# Patient Record
Sex: Female | Born: 1965 | Race: Black or African American | Hispanic: No | Marital: Single | Smoking: Never smoker
Health system: Southern US, Community
[De-identification: ages and names within clinical notes are randomized; demographics above are authoritative.]

## PROBLEM LIST (undated history)

## (undated) DIAGNOSIS — I10 Essential (primary) hypertension: Secondary | ICD-10-CM

## (undated) DIAGNOSIS — I509 Heart failure, unspecified: Secondary | ICD-10-CM

## (undated) DIAGNOSIS — E119 Type 2 diabetes mellitus without complications: Secondary | ICD-10-CM

## (undated) HISTORY — DX: Heart failure, unspecified: I50.9

---

## 2005-04-14 ENCOUNTER — Emergency Department: Payer: Self-pay | Admitting: Emergency Medicine

## 2006-02-17 ENCOUNTER — Ambulatory Visit: Payer: Self-pay | Admitting: Family Medicine

## 2006-03-02 ENCOUNTER — Ambulatory Visit: Payer: Self-pay | Admitting: Family Medicine

## 2006-04-01 ENCOUNTER — Ambulatory Visit: Payer: Self-pay | Admitting: Family Medicine

## 2006-05-02 ENCOUNTER — Ambulatory Visit: Payer: Self-pay | Admitting: Family Medicine

## 2006-07-01 ENCOUNTER — Emergency Department: Payer: Self-pay | Admitting: Emergency Medicine

## 2008-01-04 ENCOUNTER — Emergency Department: Payer: Self-pay

## 2009-03-27 ENCOUNTER — Emergency Department: Payer: Self-pay | Admitting: Emergency Medicine

## 2009-06-09 ENCOUNTER — Emergency Department: Payer: Self-pay | Admitting: Emergency Medicine

## 2010-05-22 ENCOUNTER — Inpatient Hospital Stay: Payer: Self-pay | Admitting: Internal Medicine

## 2010-05-23 ENCOUNTER — Ambulatory Visit: Payer: Self-pay | Admitting: Cardiovascular Disease

## 2010-09-02 ENCOUNTER — Emergency Department: Payer: Self-pay | Admitting: Emergency Medicine

## 2010-09-06 ENCOUNTER — Emergency Department (HOSPITAL_COMMUNITY): Admission: EM | Admit: 2010-09-06 | Discharge: 2010-09-06 | Payer: Self-pay | Admitting: Emergency Medicine

## 2010-12-01 ENCOUNTER — Emergency Department: Payer: Self-pay | Admitting: Emergency Medicine

## 2011-02-14 LAB — DIFFERENTIAL
Basophils Absolute: 0 10*3/uL (ref 0.0–0.1)
Basophils Relative: 0 % (ref 0–1)
Eosinophils Absolute: 0.1 10*3/uL (ref 0.0–0.7)
Eosinophils Relative: 2 % (ref 0–5)
Lymphocytes Relative: 14 % (ref 12–46)
Lymphs Abs: 1 10*3/uL (ref 0.7–4.0)
Monocytes Absolute: 0.6 10*3/uL (ref 0.1–1.0)
Monocytes Relative: 8 % (ref 3–12)
Neutro Abs: 5.3 10*3/uL (ref 1.7–7.7)
Neutrophils Relative %: 76 % (ref 43–77)

## 2011-02-14 LAB — CBC
HCT: 28.7 % — ABNORMAL LOW (ref 36.0–46.0)
Hemoglobin: 9.1 g/dL — ABNORMAL LOW (ref 12.0–15.0)
MCH: 25.1 pg — ABNORMAL LOW (ref 26.0–34.0)
MCHC: 31.7 g/dL (ref 30.0–36.0)
MCV: 79.1 fL (ref 78.0–100.0)
Platelets: 312 10*3/uL (ref 150–400)
RBC: 3.63 MIL/uL — ABNORMAL LOW (ref 3.87–5.11)
RDW: 16 % — ABNORMAL HIGH (ref 11.5–15.5)
WBC: 7.1 10*3/uL (ref 4.0–10.5)

## 2011-02-14 LAB — URINALYSIS, ROUTINE W REFLEX MICROSCOPIC
Bilirubin Urine: NEGATIVE
Glucose, UA: NEGATIVE mg/dL
Hgb urine dipstick: NEGATIVE
Ketones, ur: NEGATIVE mg/dL
Nitrite: NEGATIVE
Protein, ur: NEGATIVE mg/dL
Specific Gravity, Urine: 1.021 (ref 1.005–1.030)
Urobilinogen, UA: 0.2 mg/dL (ref 0.0–1.0)
pH: 6 (ref 5.0–8.0)

## 2011-02-14 LAB — POCT I-STAT, CHEM 8
BUN: 6 mg/dL (ref 6–23)
Calcium, Ion: 1.18 mmol/L (ref 1.12–1.32)
Chloride: 102 mEq/L (ref 96–112)
Creatinine, Ser: 0.7 mg/dL (ref 0.4–1.2)
Glucose, Bld: 123 mg/dL — ABNORMAL HIGH (ref 70–99)
HCT: 32 % — ABNORMAL LOW (ref 36.0–46.0)
Hemoglobin: 10.9 g/dL — ABNORMAL LOW (ref 12.0–15.0)
Potassium: 3.6 mEq/L (ref 3.5–5.1)
Sodium: 138 mEq/L (ref 135–145)
TCO2: 28 mmol/L (ref 0–100)

## 2011-02-14 LAB — GLUCOSE, CAPILLARY: Glucose-Capillary: 141 mg/dL — ABNORMAL HIGH (ref 70–99)

## 2011-02-14 LAB — URINE MICROSCOPIC-ADD ON

## 2011-02-25 ENCOUNTER — Observation Stay: Payer: Self-pay | Admitting: Internal Medicine

## 2011-03-01 ENCOUNTER — Emergency Department: Payer: Self-pay | Admitting: Emergency Medicine

## 2011-03-02 ENCOUNTER — Emergency Department: Payer: Self-pay | Admitting: Emergency Medicine

## 2011-03-23 ENCOUNTER — Observation Stay: Payer: Self-pay | Admitting: Internal Medicine

## 2011-04-02 ENCOUNTER — Emergency Department: Payer: Self-pay | Admitting: Emergency Medicine

## 2011-04-03 ENCOUNTER — Emergency Department: Payer: Self-pay | Admitting: Emergency Medicine

## 2011-04-12 ENCOUNTER — Emergency Department: Payer: Self-pay | Admitting: Emergency Medicine

## 2011-04-28 ENCOUNTER — Emergency Department: Payer: Self-pay | Admitting: Emergency Medicine

## 2011-12-11 IMAGING — CR DG CHEST 2V
1 series · 3 of 3 positions shown · non-contrast
Comparison: none

REASON FOR EXAM: SYNCOPE
COMMENTS:   May transport without cardiac monitor

PROCEDURE:     DXR - DXR CHEST PA (OR AP) AND LATERAL  - September 02, 2010 [DATE]
RESULT:     The lungs are clear. The cardiac silhouette and visualized bony
skeleton are unremarkable.

[Series 1: view not recorded · 0.17mm/px · 3 of 3 slices shown]
[im 1/3]
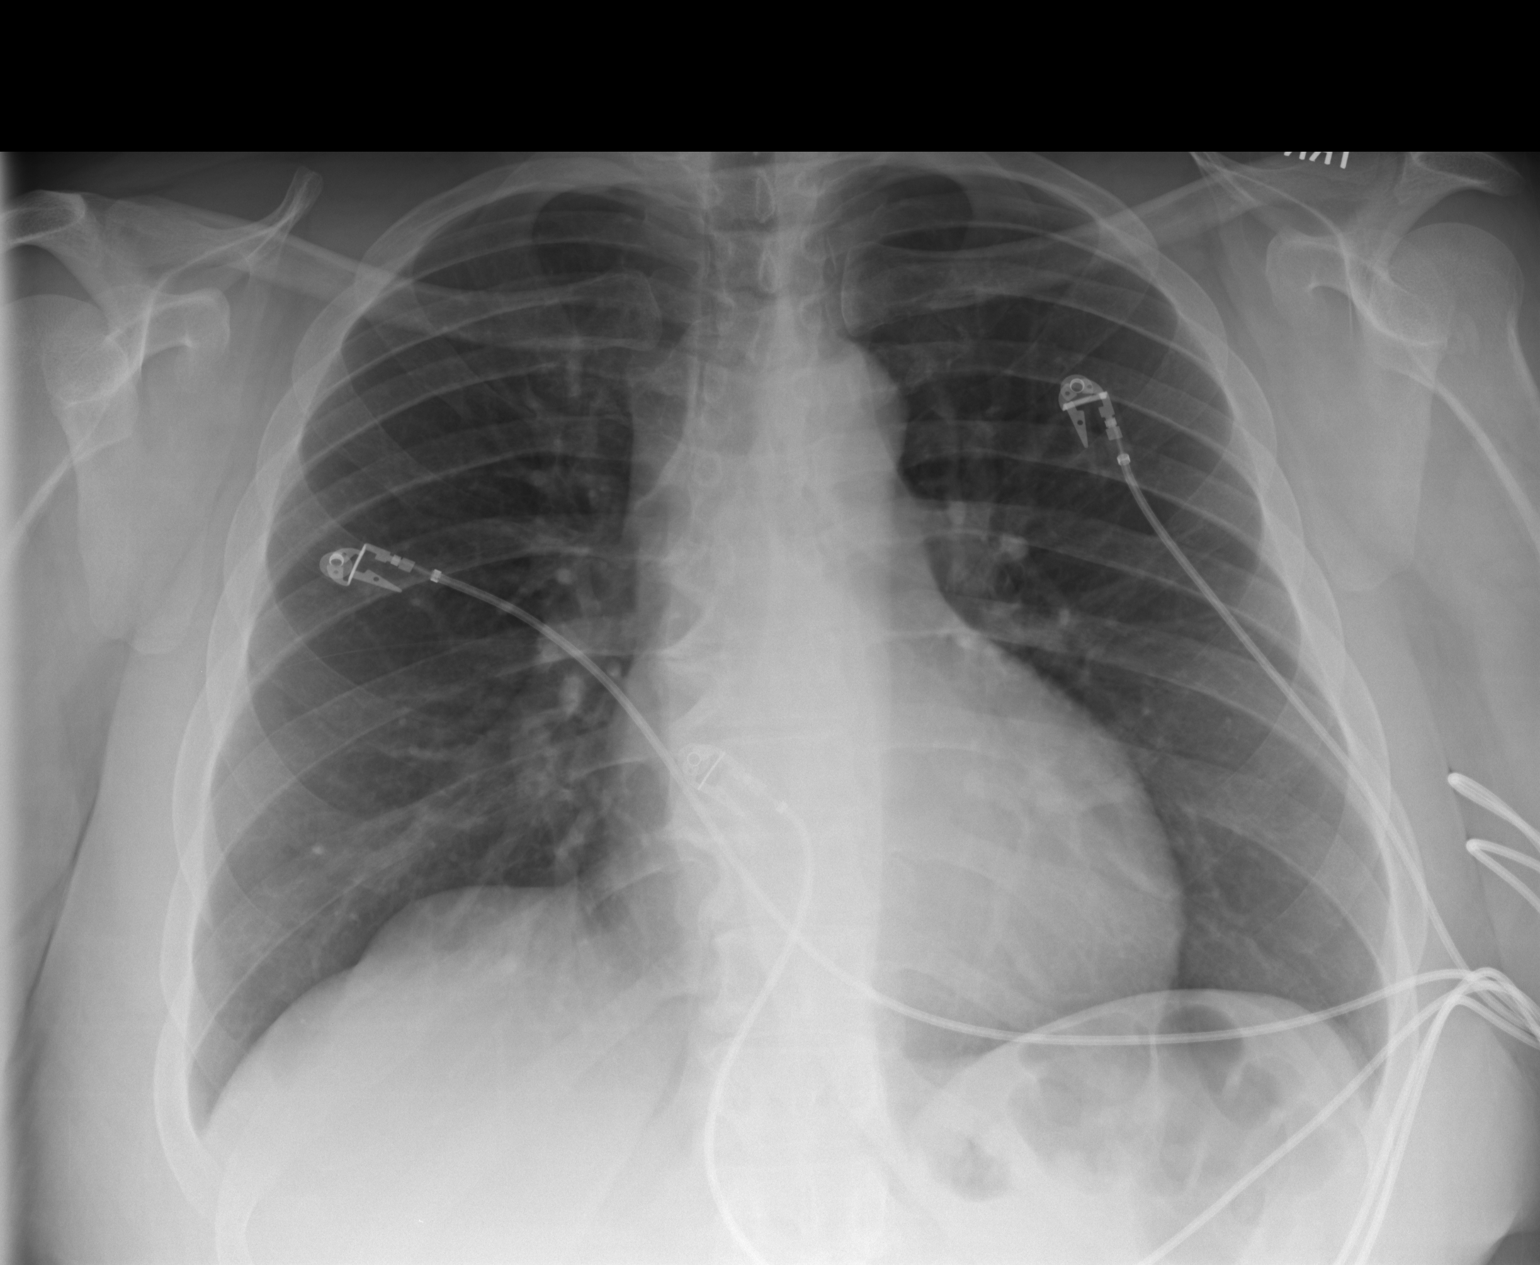
[im 2/3]
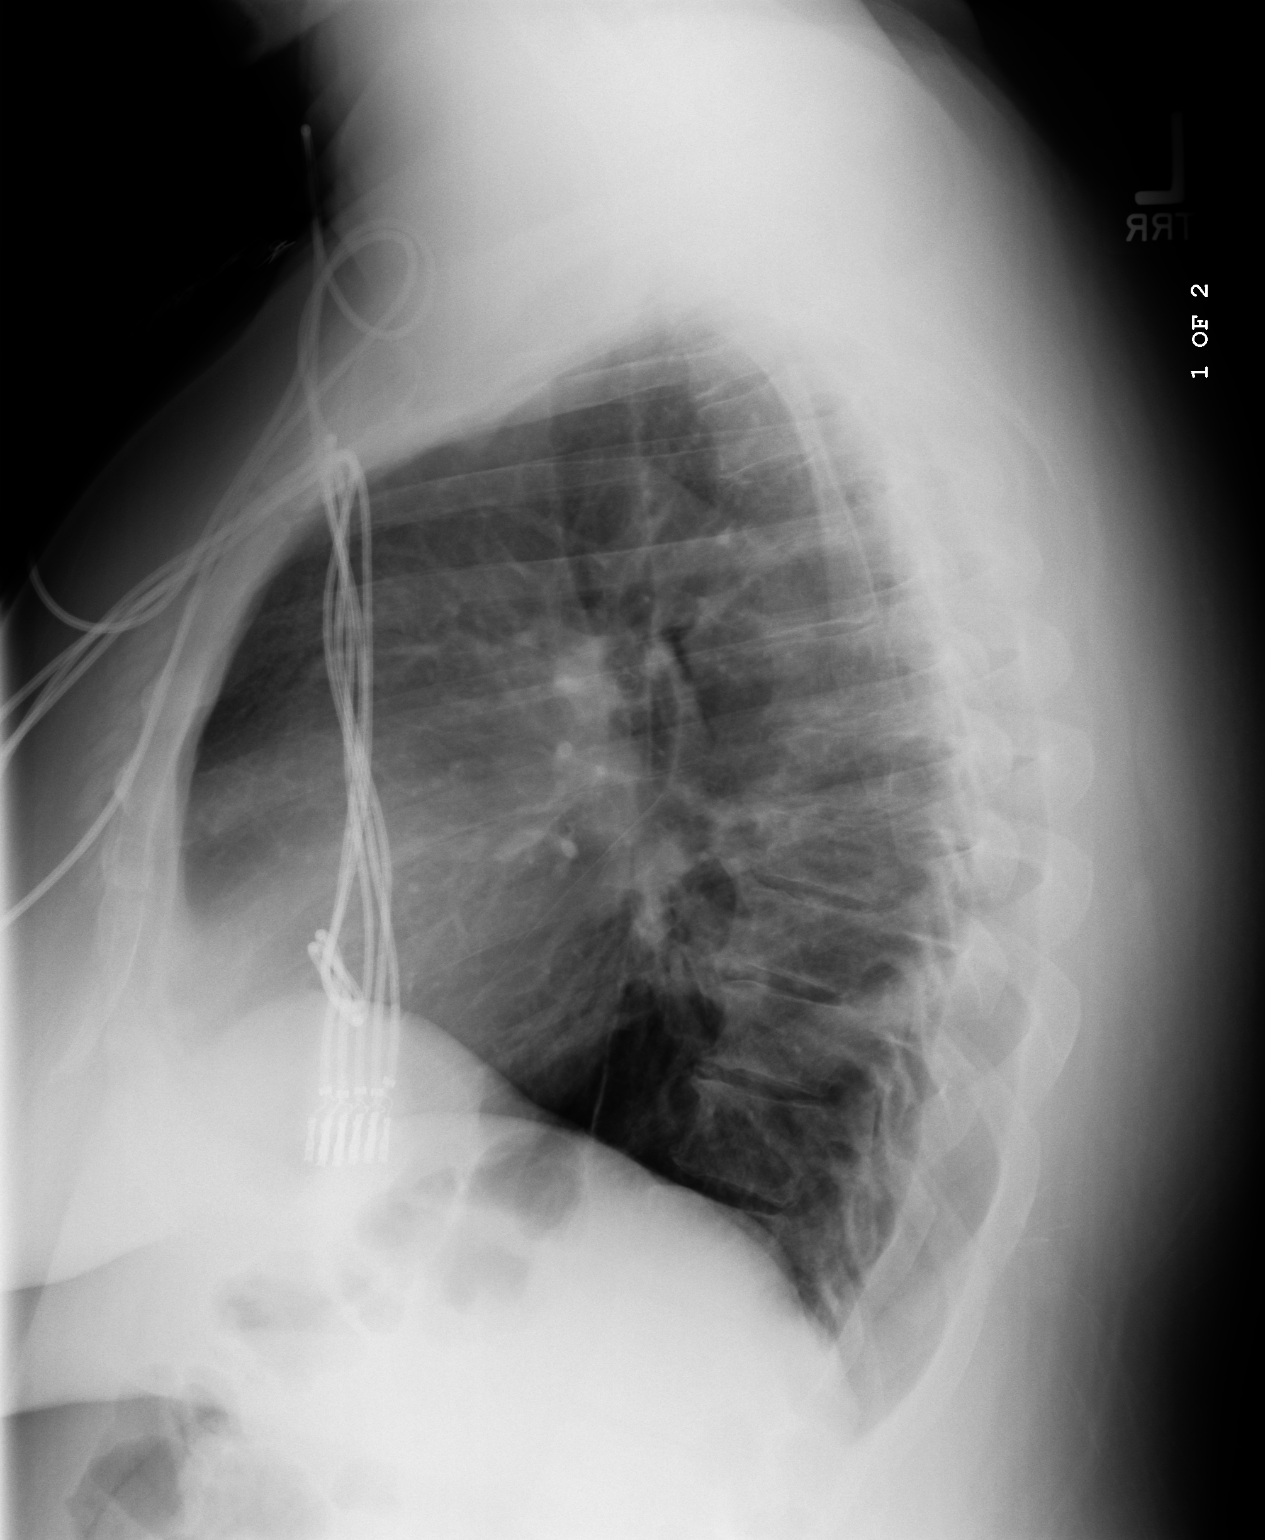
[im 3/3]
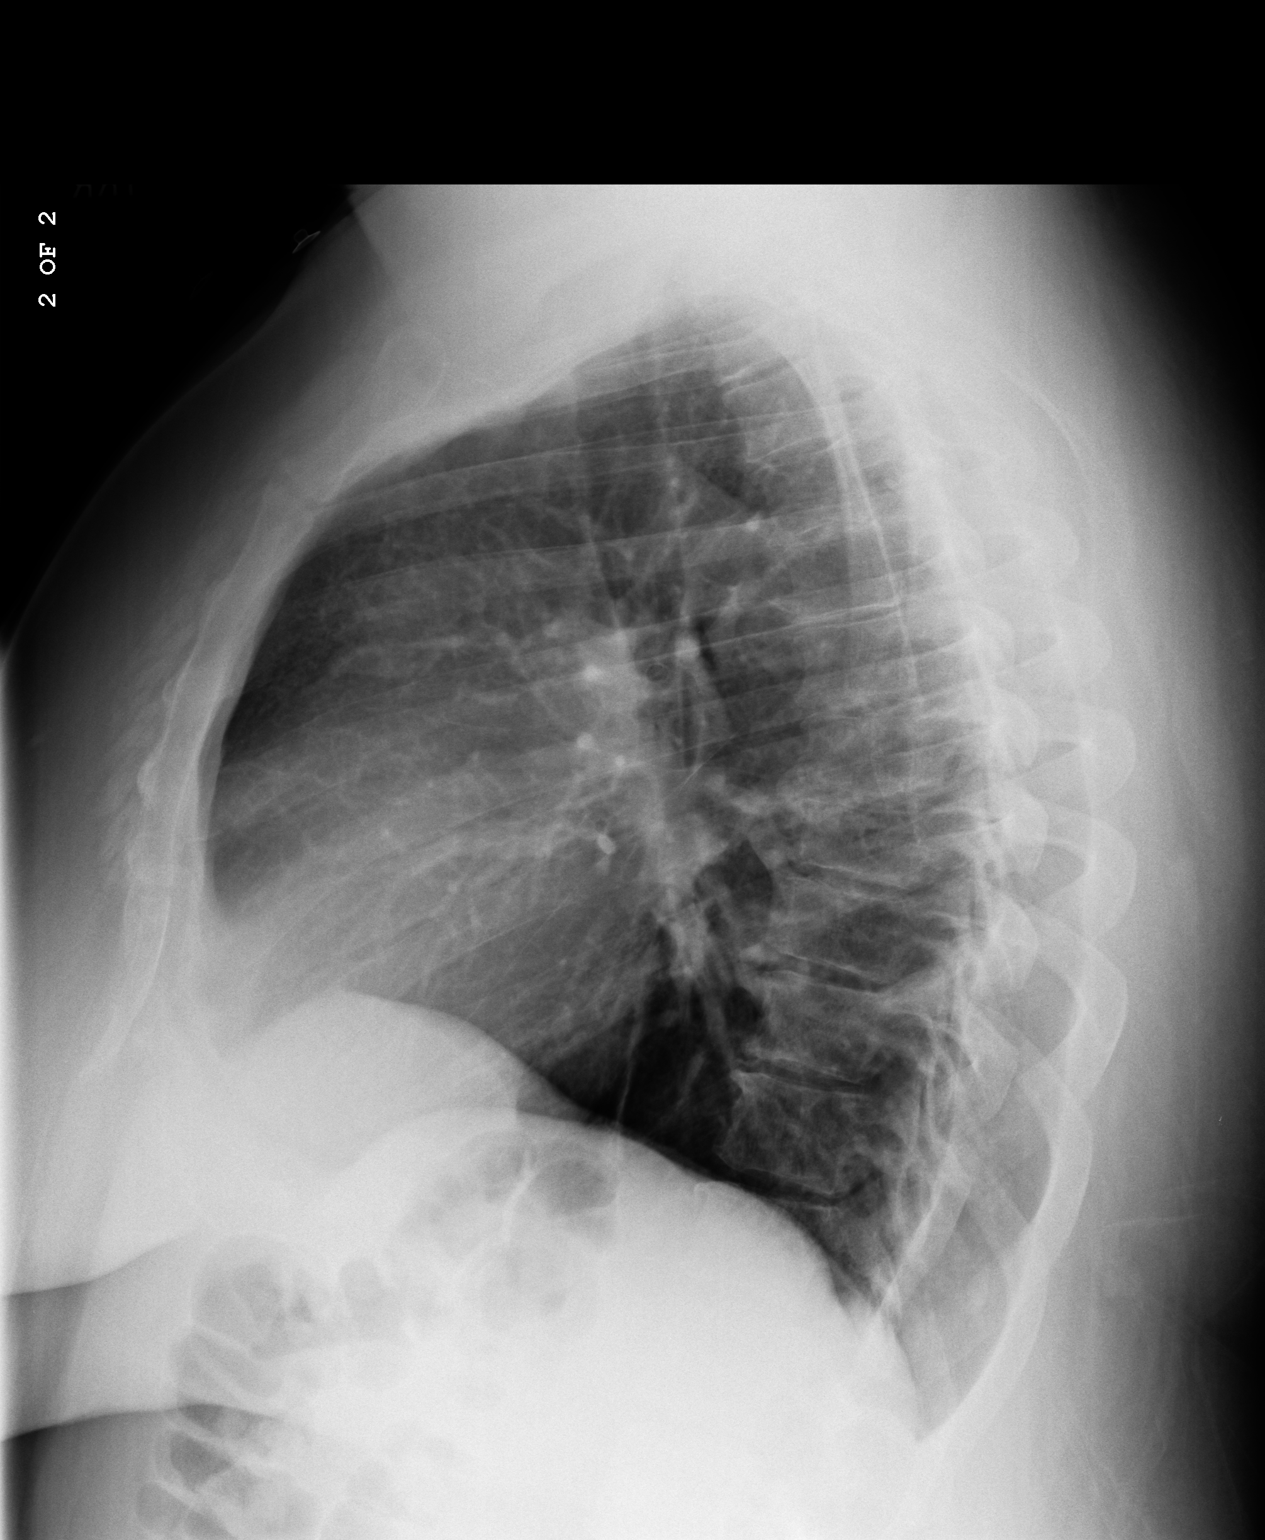

[3 of 3 positions shown; findings below may reference images not displayed]

IMPRESSION: 1. Chest radiograph without evidence of acute cardiopulmonary disease.

## 2011-12-11 IMAGING — CT CT HEAD WITHOUT CONTRAST
1 series · 16 of 30 positions shown, 20 images · non-contrast
Comparison: none

REASON FOR EXAM: SYNCOPE
COMMENTS:   May transport without cardiac monitor

PROCEDURE:     CT  - CT HEAD WITHOUT CONTRAST  - September 02, 2010 [DATE]
RESULT:     Technique: Helical 5mm sections were obtained from the skull
base to the vertex without administration of intravenous contrast.

[Series 2: without · axial · non-contrast · 0.48mm/px · z∈[+259,+394]mm · 16 of 31 slices shown, 20 images]
[im 2/31  brain]
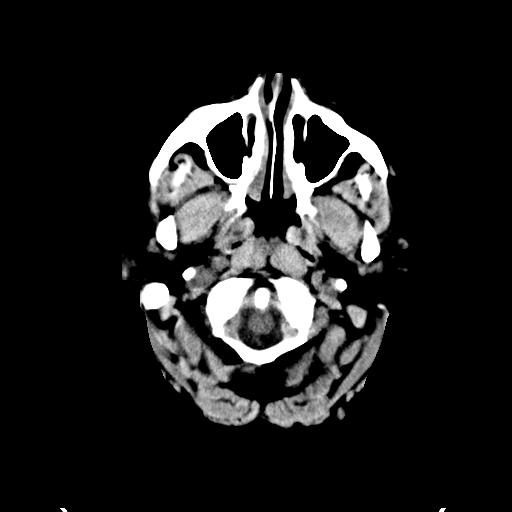
[im 2/31  bone]
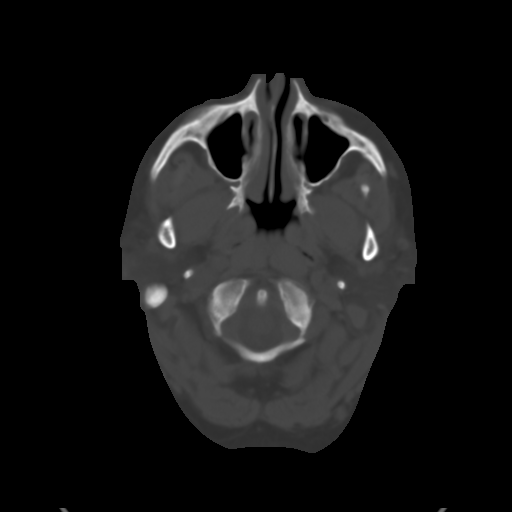
[im 4/31  brain]
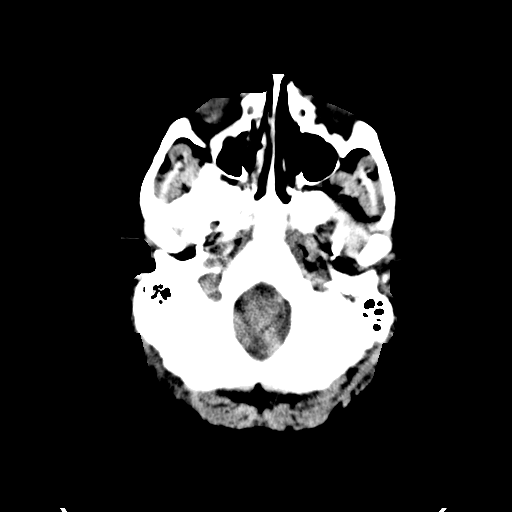
[im 6/31  brain]
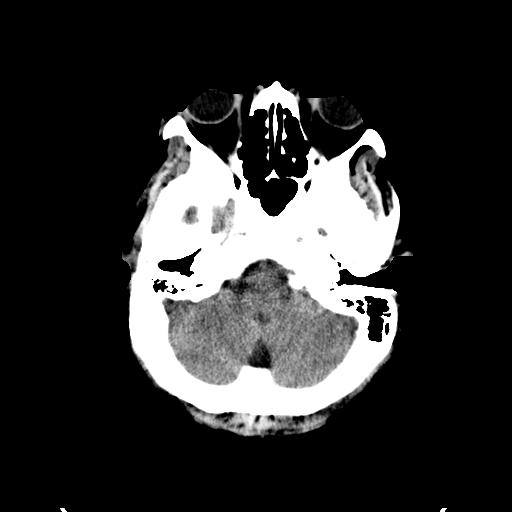
[im 8/31  brain]
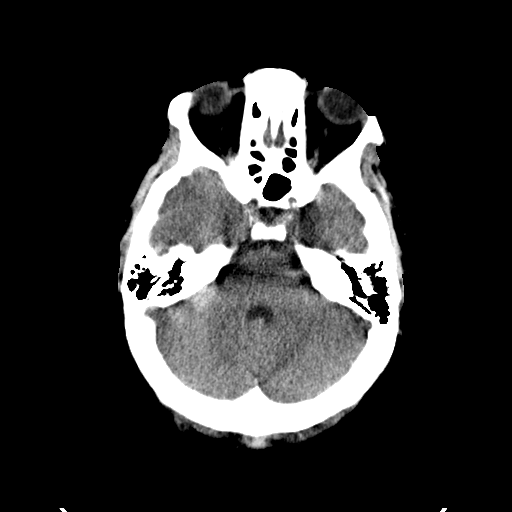
[im 9/31  brain]
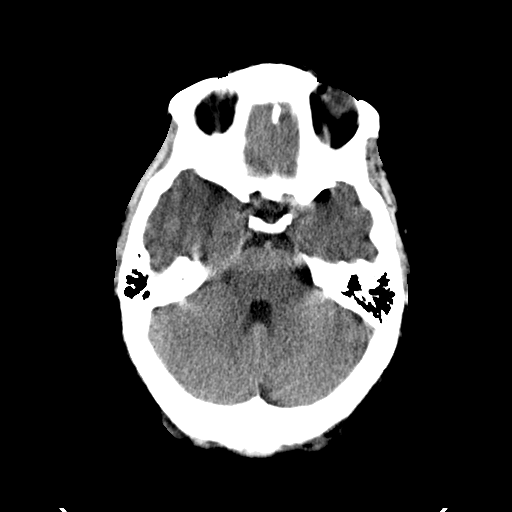
[im 9/31  bone]
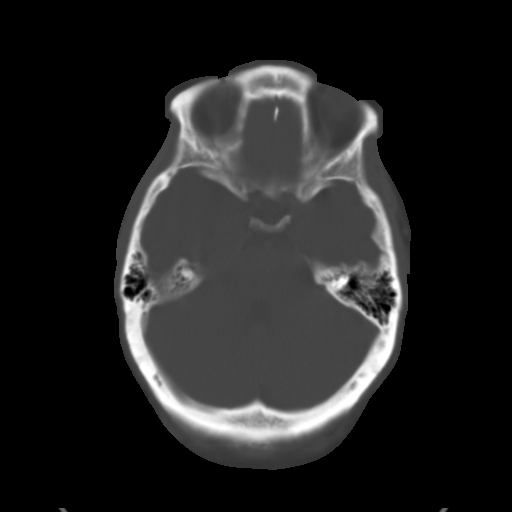
[im 11/31  brain]
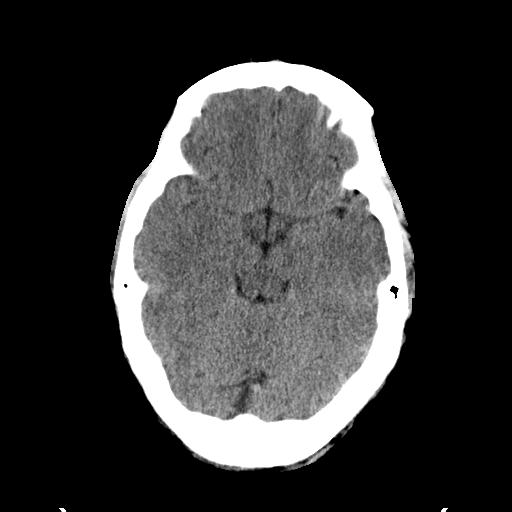
[im 13/31  brain]
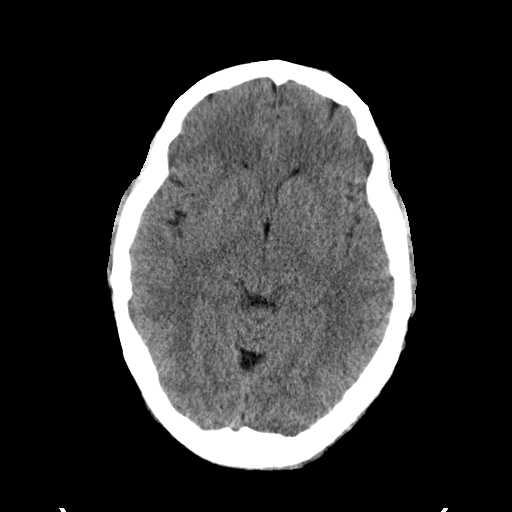
[im 15/31  brain]
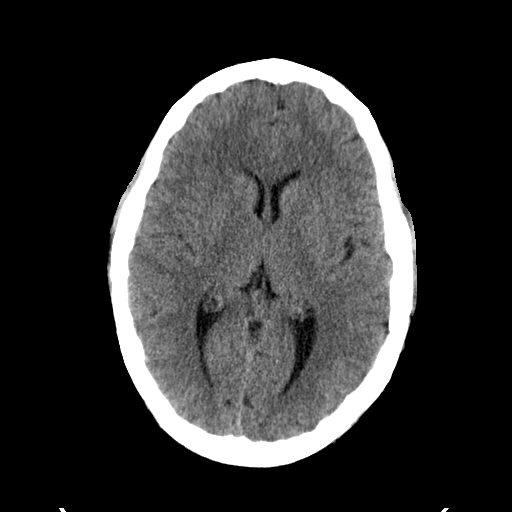
[im 16/31  brain]
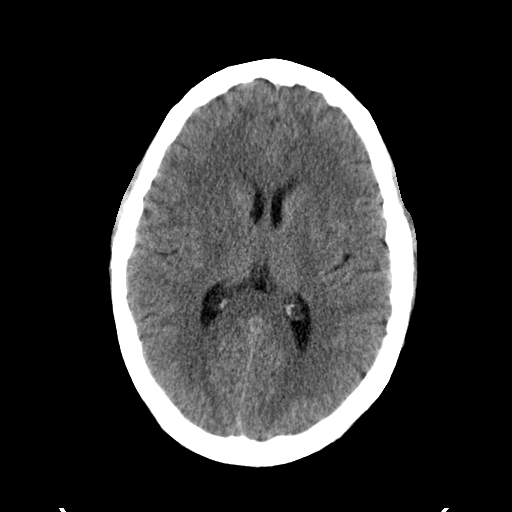
[im 16/31  bone]
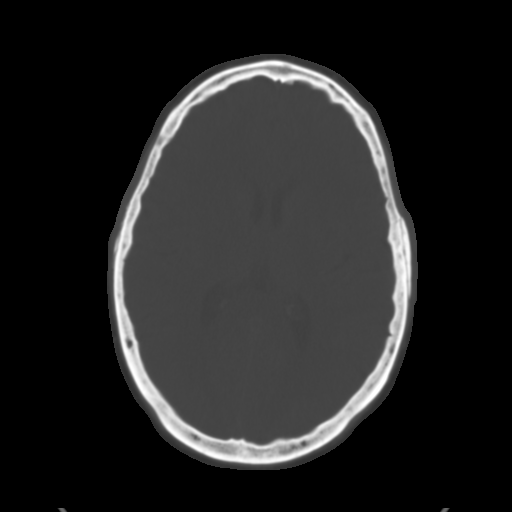
[im 18/31  brain]
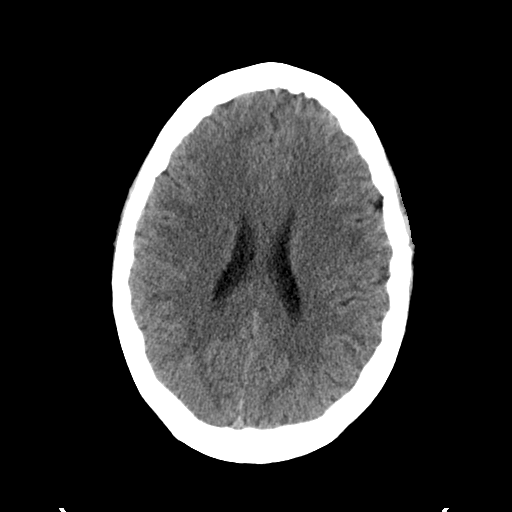
[im 20/31  brain]
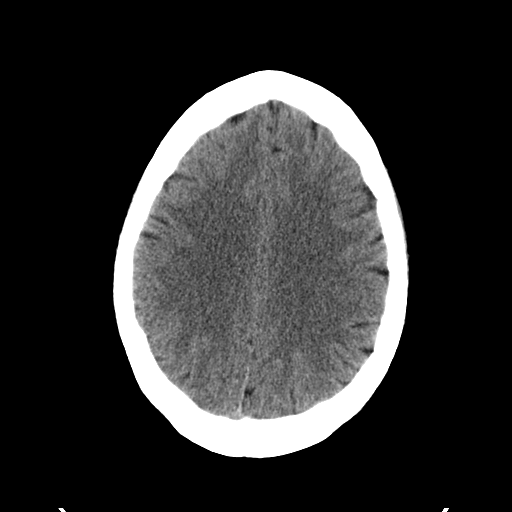
[im 22/31  brain]
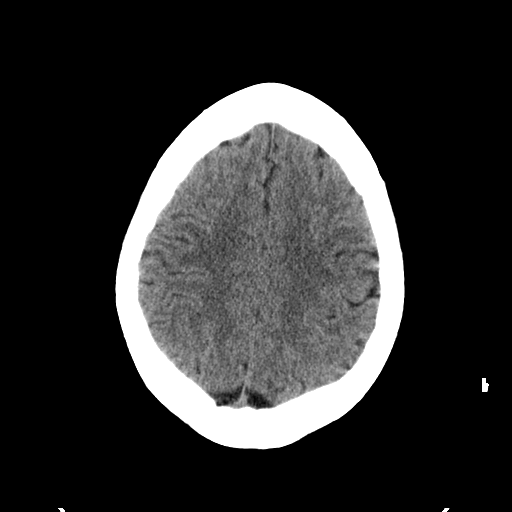
[im 23/31  brain]
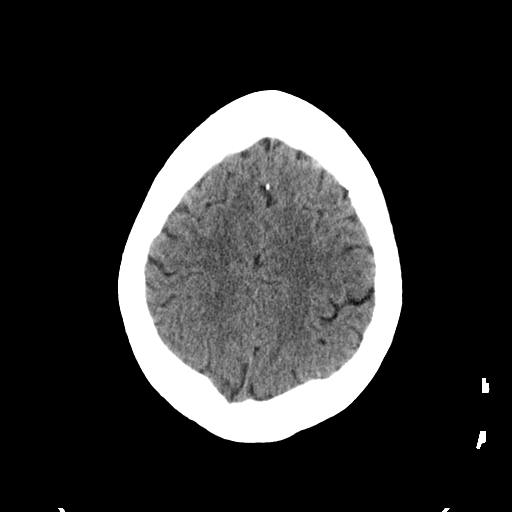
[im 23/31  bone]
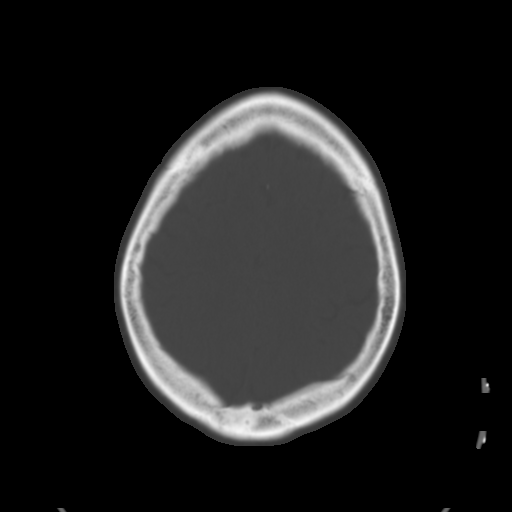
[im 25/31  brain]
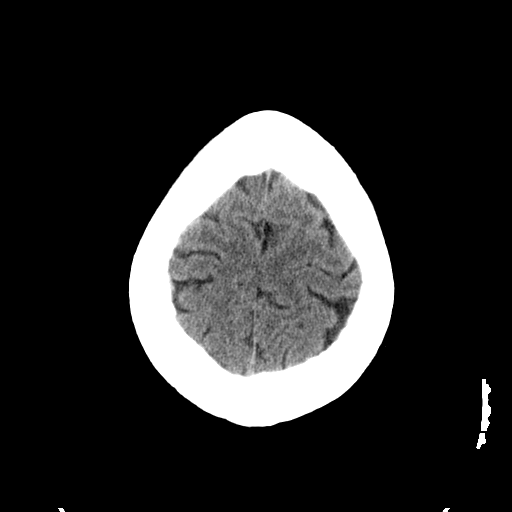
[im 27/31  brain]
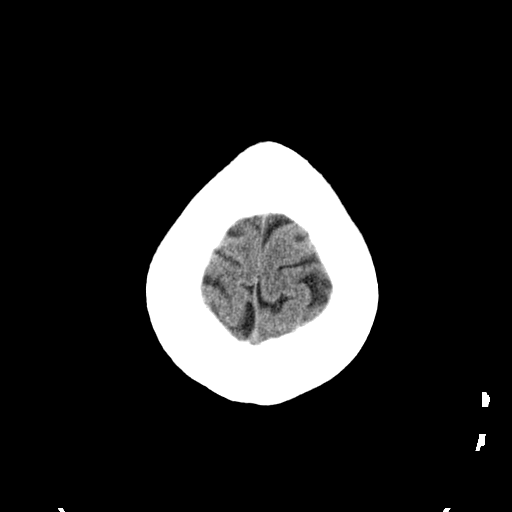
[im 29/31  brain]
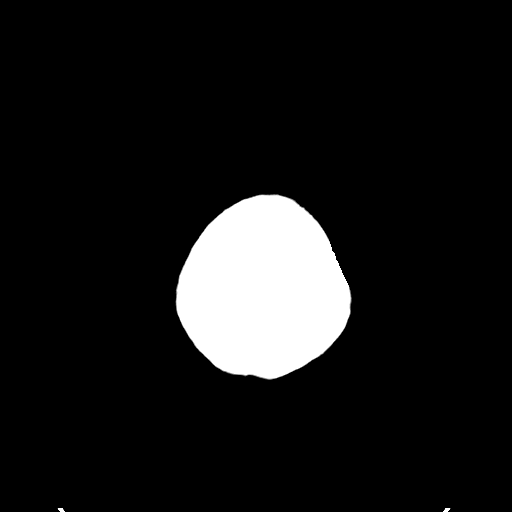

[16 of 30 positions shown; findings below may reference images not displayed]

FINDINGS: There is not evidence of intra-axial fluid collections. There is
no evidence of acute hemorrhage or secondary signs reflecting mass effect or
subacute or chronic focal territorial infarction. The osseous structures
demonstrate no evidence of a depressed skull fracture. If there is
persistent concern clinical follow-up with MRI is recommended.
IMPRESSION: 1. No evidence of acute intracranial abnormalitites.
2. Comparison made to prior study dated 05/22/2010.

## 2012-06-30 IMAGING — CR DG CHEST 1V
1 series · 1 of 1 positions shown · non-contrast
Comparison: none

REASON FOR EXAM: chest pain
COMMENTS:

[view not recorded]
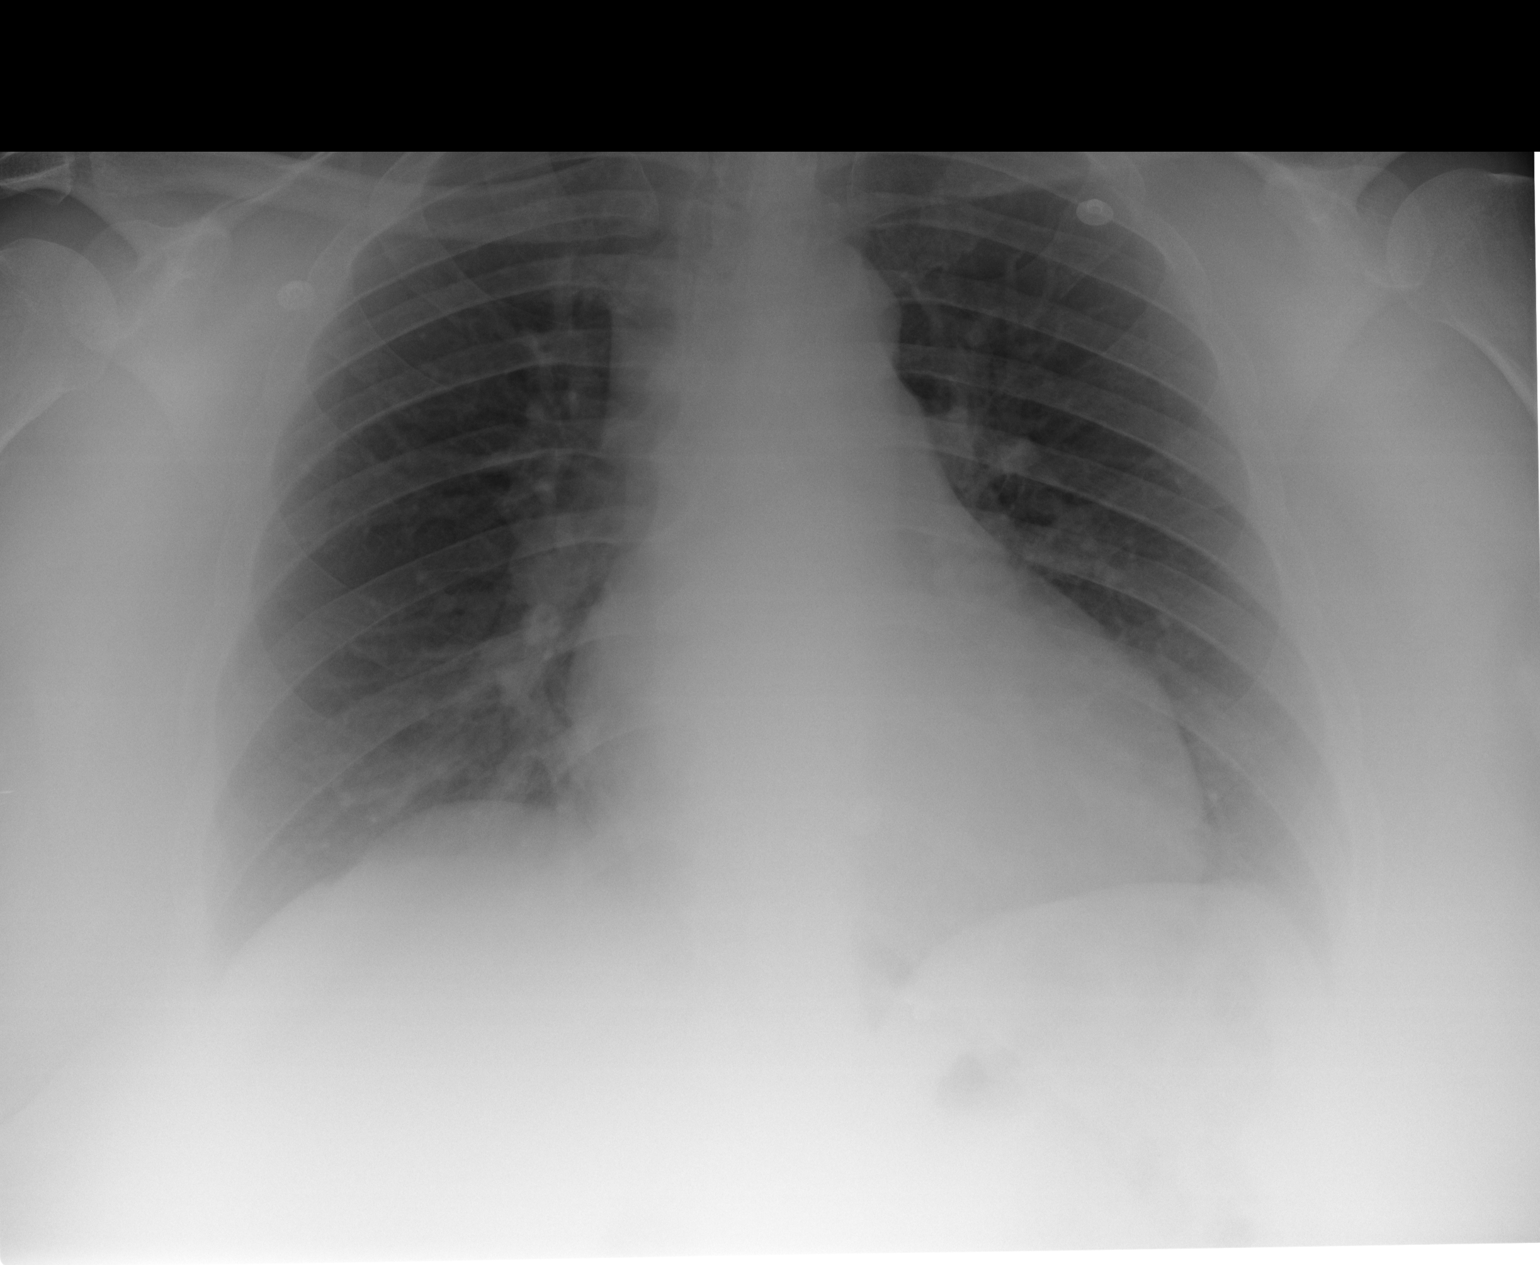

[1 of 1 positions shown; findings below may reference images not displayed]

PROCEDURE:     DXR - DXR CHEST 1 VIEWAP OR PA  - March 23, 2011  [DATE]

RESULT:     Comparison is made to the study 26 February, 2011.

The cardiac silhouette is mildly enlarged. The central pulmonary vascularity
is prominent. More peripherally the pulmonary interstitium exhibits no more
than minimal prominence. I see no pleural effusion or alveolar infiltrate.
IMPRESSION: I do not see evidence of pneumonia. I cannot exclude
low-grade compensated CHF in the appropriate clinical setting. Alternatively
there may be chronic mild enlargement of the cardiac silhouette with
superimposed acute bronchitis. A followup PA and lateral chest x-ray would
be of value.

## 2012-07-11 IMAGING — CR DG CHEST 2V
1 series · 2 of 2 positions shown · non-contrast
Comparison: none

REASON FOR EXAM: cough
COMMENTS:

[Series 1: view not recorded · 0.17mm/px · 2 of 2 slices shown]
[im 1/2]
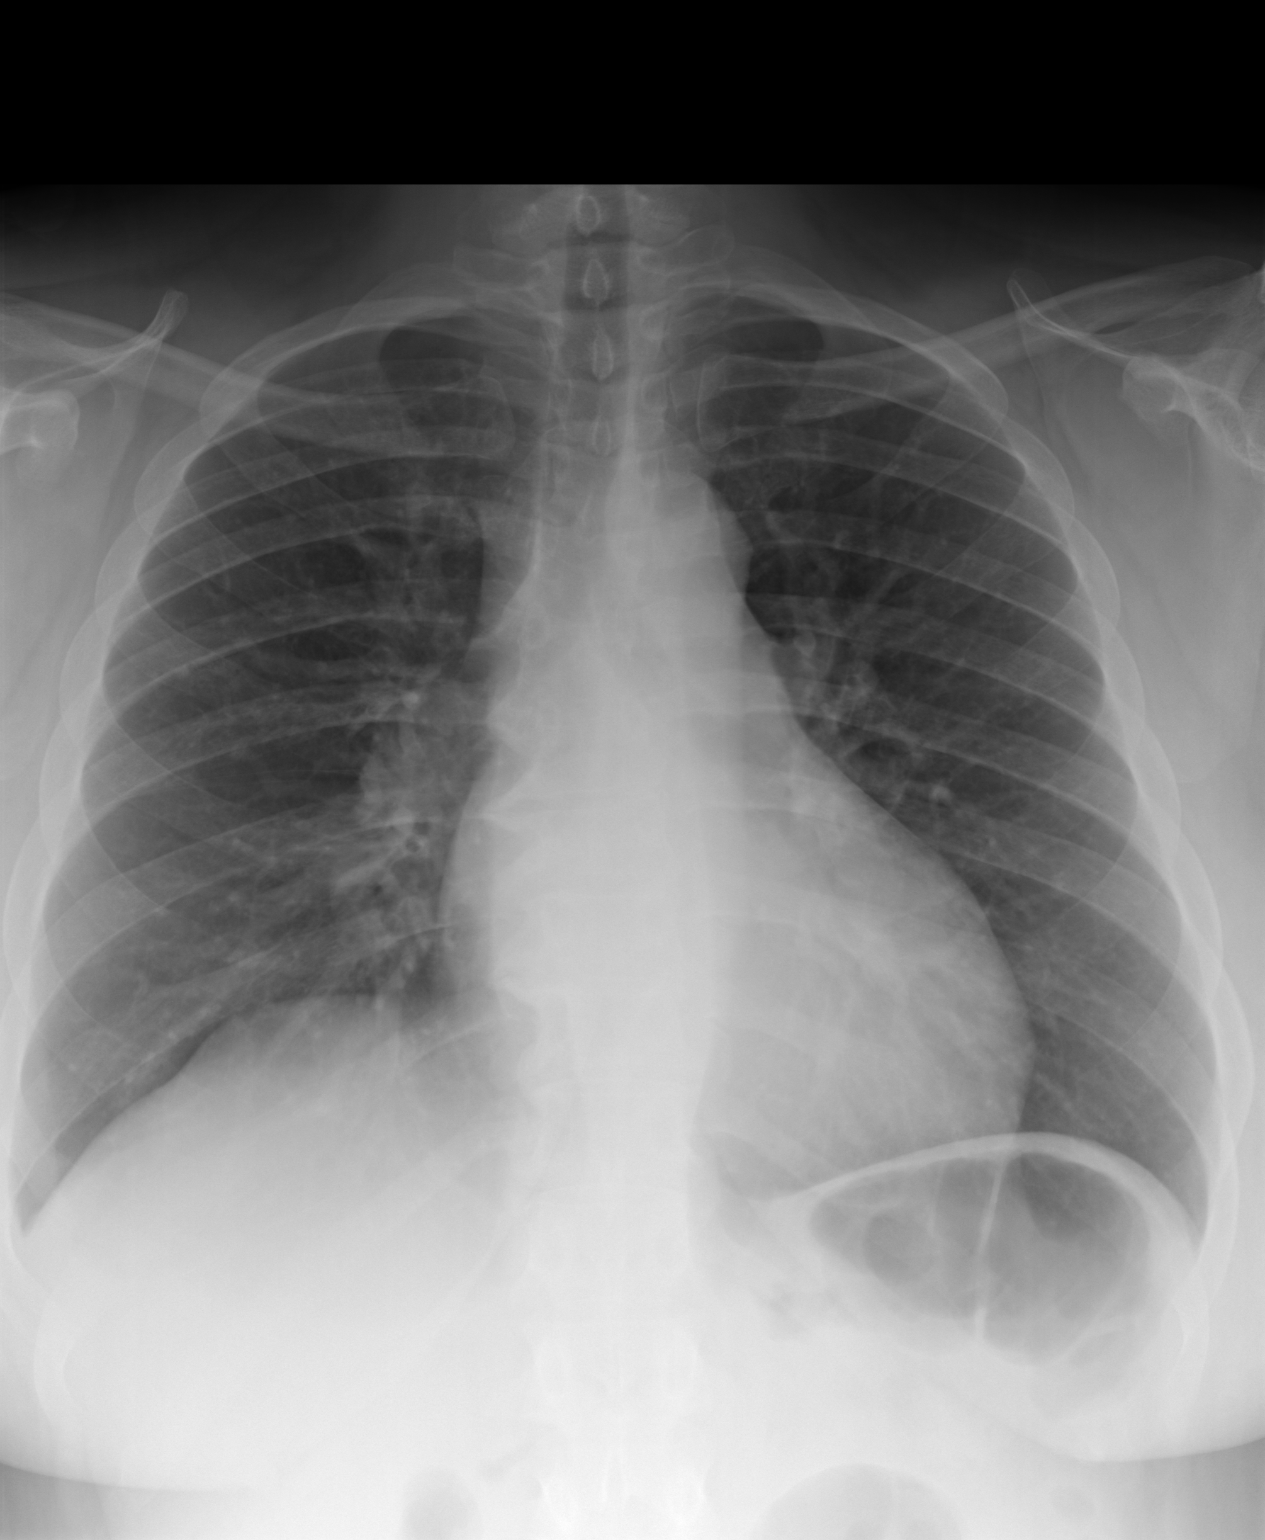
[im 2/2]
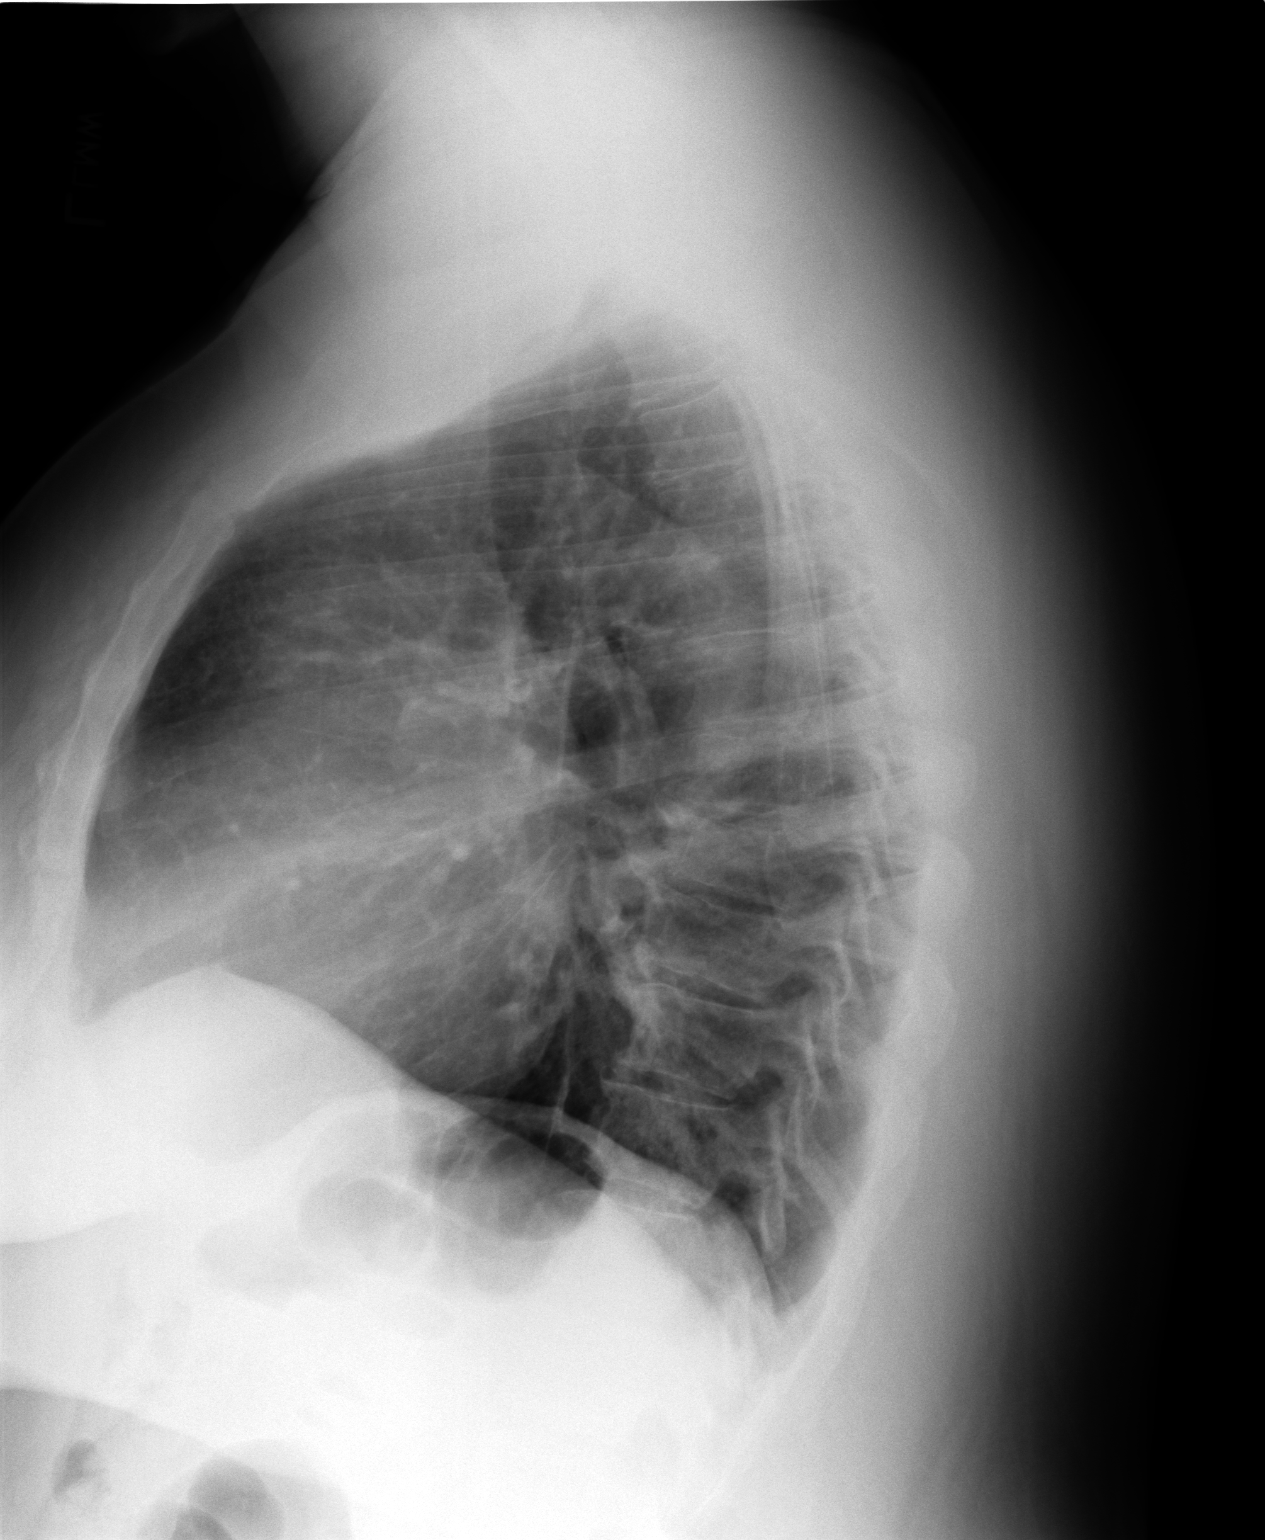

[2 of 2 positions shown; findings below may reference images not displayed]

PROCEDURE:     DXR - DXR CHEST PA (OR AP) AND LATERAL  - April 03, 2011 [DATE]

RESULT:     Comparison is made to the prior exam of 03/23/2011. The lung
fields are clear. No pneumonia, pneumothorax or pleural effusion is seen.
Heart size is upper limits for normal. No acute bony abnormalities are seen.
IMPRESSION: 1.     No acute changes are identified.

## 2012-12-04 ENCOUNTER — Observation Stay: Payer: Self-pay | Admitting: Internal Medicine

## 2012-12-04 LAB — HEMOGLOBIN A1C: Hemoglobin A1C: 6.9 % — ABNORMAL HIGH (ref 4.2–6.3)

## 2012-12-04 LAB — LIPID PANEL
Cholesterol: 255 mg/dL — ABNORMAL HIGH (ref 0–200)
HDL Cholesterol: 59 mg/dL (ref 40–60)
Ldl Cholesterol, Calc: 170 mg/dL — ABNORMAL HIGH (ref 0–100)
Triglycerides: 132 mg/dL (ref 0–200)
VLDL Cholesterol, Calc: 26 mg/dL (ref 5–40)

## 2012-12-04 LAB — TROPONIN I
Troponin-I: 0.07 ng/mL — ABNORMAL HIGH
Troponin-I: 0.11 ng/mL — ABNORMAL HIGH

## 2012-12-04 LAB — CBC
HCT: 30.8 % — ABNORMAL LOW (ref 35.0–47.0)
HGB: 9.6 g/dL — ABNORMAL LOW (ref 12.0–16.0)
MCH: 22.3 pg — ABNORMAL LOW (ref 26.0–34.0)
MCHC: 31.2 g/dL — ABNORMAL LOW (ref 32.0–36.0)
MCV: 72 fL — ABNORMAL LOW (ref 80–100)
Platelet: 301 10*3/uL (ref 150–440)
RBC: 4.3 10*6/uL (ref 3.80–5.20)
RDW: 19.1 % — ABNORMAL HIGH (ref 11.5–14.5)
WBC: 7.7 10*3/uL (ref 3.6–11.0)

## 2012-12-04 LAB — PREGNANCY, URINE: Pregnancy Test, Urine: NEGATIVE m[IU]/mL

## 2012-12-04 LAB — URINALYSIS, COMPLETE
Bilirubin,UR: NEGATIVE
Blood: NEGATIVE
Glucose,UR: NEGATIVE mg/dL (ref 0–75)
Leukocyte Esterase: NEGATIVE
Nitrite: NEGATIVE
Ph: 7 (ref 4.5–8.0)
Protein: NEGATIVE
RBC,UR: NONE SEEN /HPF (ref 0–5)
Specific Gravity: 1.004 (ref 1.003–1.030)
Squamous Epithelial: 2
WBC UR: <1 /HPF (ref 0–5)

## 2012-12-04 LAB — CK TOTAL AND CKMB (NOT AT ARMC)
CK, Total: 81 U/L (ref 21–215)
CK, Total: 98 U/L (ref 21–215)
CK-MB: <0.5 ng/mL — ABNORMAL LOW (ref 0.5–3.6)
CK-MB: 0.7 ng/mL (ref 0.5–3.6)

## 2012-12-04 LAB — BASIC METABOLIC PANEL
Anion Gap: 8 (ref 7–16)
BUN: 9 mg/dL (ref 7–18)
Calcium, Total: 9.5 mg/dL (ref 8.5–10.1)
Chloride: 104 mmol/L (ref 98–107)
Co2: 25 mmol/L (ref 21–32)
Creatinine: 0.84 mg/dL (ref 0.60–1.30)
EGFR (African American): >60
EGFR (Non-African Amer.): >60
Glucose: 135 mg/dL — ABNORMAL HIGH (ref 65–99)
Osmolality: 275 (ref 275–301)
Potassium: 3.7 mmol/L (ref 3.5–5.1)
Sodium: 137 mmol/L (ref 136–145)

## 2012-12-04 LAB — FERRITIN: Ferritin (ARMC): 5 ng/mL — ABNORMAL LOW (ref 8–388)

## 2012-12-04 LAB — IRON AND TIBC
Iron Bind.Cap.(Total): 427 ug/dL (ref 250–450)
Iron Saturation: 6 %
Iron: 24 ug/dL — ABNORMAL LOW (ref 50–170)
Unbound Iron-Bind.Cap.: 403 ug/dL

## 2012-12-04 LAB — PRO B NATRIURETIC PEPTIDE: B-Type Natriuretic Peptide: 199 pg/mL — ABNORMAL HIGH (ref 0–125)

## 2012-12-04 LAB — RETICULOCYTES
Absolute Retic Count: 0.0765 10*6/uL (ref 0.023–0.096)
Reticulocyte: 1.8 % (ref 0.5–2.2)

## 2012-12-05 LAB — URINE CULTURE

## 2013-07-11 ENCOUNTER — Emergency Department: Payer: Self-pay | Admitting: Internal Medicine

## 2013-08-05 ENCOUNTER — Emergency Department: Payer: Self-pay | Admitting: Emergency Medicine

## 2013-08-05 LAB — COMPREHENSIVE METABOLIC PANEL
Albumin: 3.4 g/dL (ref 3.4–5.0)
Alkaline Phosphatase: 83 U/L (ref 50–136)
Anion Gap: 4 — ABNORMAL LOW (ref 7–16)
BUN: 9 mg/dL (ref 7–18)
Bilirubin,Total: 0.2 mg/dL (ref 0.2–1.0)
Calcium, Total: 9.2 mg/dL (ref 8.5–10.1)
Chloride: 109 mmol/L — ABNORMAL HIGH (ref 98–107)
Co2: 27 mmol/L (ref 21–32)
Creatinine: 0.79 mg/dL (ref 0.60–1.30)
EGFR (African American): >60
EGFR (Non-African Amer.): >60
Glucose: 116 mg/dL — ABNORMAL HIGH (ref 65–99)
Osmolality: 279 (ref 275–301)
Potassium: 3.7 mmol/L (ref 3.5–5.1)
SGOT(AST): 14 U/L — ABNORMAL LOW (ref 15–37)
SGPT (ALT): 15 U/L (ref 12–78)
Sodium: 140 mmol/L (ref 136–145)
Total Protein: 7.9 g/dL (ref 6.4–8.2)

## 2013-08-05 LAB — URINALYSIS, COMPLETE
Bacteria: NONE SEEN
Bilirubin,UR: NEGATIVE
Glucose,UR: 50 mg/dL (ref 0–75)
Hyaline Cast: 2
Ketone: NEGATIVE
Leukocyte Esterase: NEGATIVE
Nitrite: NEGATIVE
Ph: 7 (ref 4.5–8.0)
Protein: NEGATIVE
RBC,UR: 89 /HPF (ref 0–5)
Specific Gravity: 1.016 (ref 1.003–1.030)
Squamous Epithelial: <1
WBC UR: 1 /HPF (ref 0–5)

## 2013-08-05 LAB — WET PREP, GENITAL

## 2013-08-05 LAB — LIPASE, BLOOD: Lipase: 113 U/L (ref 73–393)

## 2013-08-05 LAB — CBC
HCT: 28.2 % — ABNORMAL LOW (ref 35.0–47.0)
HGB: 8.9 g/dL — ABNORMAL LOW (ref 12.0–16.0)
MCH: 23.3 pg — ABNORMAL LOW (ref 26.0–34.0)
MCHC: 31.5 g/dL — ABNORMAL LOW (ref 32.0–36.0)
MCV: 74 fL — ABNORMAL LOW (ref 80–100)
Platelet: 310 10*3/uL (ref 150–440)
RBC: 3.82 10*6/uL (ref 3.80–5.20)
RDW: 17.9 % — ABNORMAL HIGH (ref 11.5–14.5)
WBC: 8.4 10*3/uL (ref 3.6–11.0)

## 2013-08-05 LAB — GC/CHLAMYDIA PROBE AMP

## 2013-08-10 ENCOUNTER — Emergency Department: Payer: Self-pay | Admitting: Emergency Medicine

## 2013-08-10 LAB — CBC
HCT: 27.7 % — ABNORMAL LOW (ref 35.0–47.0)
HGB: 8.6 g/dL — ABNORMAL LOW (ref 12.0–16.0)
MCH: 22.9 pg — ABNORMAL LOW (ref 26.0–34.0)
MCHC: 30.9 g/dL — ABNORMAL LOW (ref 32.0–36.0)
MCV: 74 fL — ABNORMAL LOW (ref 80–100)
Platelet: 299 10*3/uL (ref 150–440)
RBC: 3.74 10*6/uL — ABNORMAL LOW (ref 3.80–5.20)
RDW: 17.8 % — ABNORMAL HIGH (ref 11.5–14.5)
WBC: 13.1 10*3/uL — ABNORMAL HIGH (ref 3.6–11.0)

## 2013-08-10 LAB — BASIC METABOLIC PANEL
Anion Gap: 8 (ref 7–16)
BUN: 8 mg/dL (ref 7–18)
Calcium, Total: 9.2 mg/dL (ref 8.5–10.1)
Chloride: 104 mmol/L (ref 98–107)
Co2: 25 mmol/L (ref 21–32)
Creatinine: 0.85 mg/dL (ref 0.60–1.30)
EGFR (African American): >60
EGFR (Non-African Amer.): >60
Glucose: 111 mg/dL — ABNORMAL HIGH (ref 65–99)
Osmolality: 273 (ref 275–301)
Potassium: 3.3 mmol/L — ABNORMAL LOW (ref 3.5–5.1)
Sodium: 137 mmol/L (ref 136–145)

## 2013-08-10 LAB — URINALYSIS, COMPLETE
Bilirubin,UR: NEGATIVE
Glucose,UR: NEGATIVE mg/dL (ref 0–75)
Hyaline Cast: 4
Nitrite: NEGATIVE
Ph: 5 (ref 4.5–8.0)
Protein: 100
RBC,UR: 9 /HPF (ref 0–5)
Specific Gravity: 1.024 (ref 1.003–1.030)
Squamous Epithelial: 13
WBC UR: 114 /HPF (ref 0–5)

## 2013-08-10 LAB — WET PREP, GENITAL

## 2013-08-12 LAB — URINE CULTURE

## 2014-01-12 ENCOUNTER — Inpatient Hospital Stay: Payer: Self-pay | Admitting: Internal Medicine

## 2014-01-12 LAB — IRON AND TIBC
Iron Bind.Cap.(Total): 346 ug/dL (ref 250–450)
Iron Saturation: 6 %
Iron: 22 ug/dL — ABNORMAL LOW (ref 50–170)
Unbound Iron-Bind.Cap.: 324 ug/dL

## 2014-01-12 LAB — CBC
HCT: 29.6 % — ABNORMAL LOW (ref 35.0–47.0)
HGB: 9 g/dL — ABNORMAL LOW (ref 12.0–16.0)
MCH: 21 pg — ABNORMAL LOW (ref 26.0–34.0)
MCHC: 30.5 g/dL — ABNORMAL LOW (ref 32.0–36.0)
MCV: 69 fL — ABNORMAL LOW (ref 80–100)
Platelet: 219 10*3/uL (ref 150–440)
RBC: 4.29 10*6/uL (ref 3.80–5.20)
RDW: 20.3 % — ABNORMAL HIGH (ref 11.5–14.5)
WBC: 5.3 10*3/uL (ref 3.6–11.0)

## 2014-01-12 LAB — BASIC METABOLIC PANEL
Anion Gap: 4 — ABNORMAL LOW (ref 7–16)
BUN: 8 mg/dL (ref 7–18)
Calcium, Total: 9.1 mg/dL (ref 8.5–10.1)
Chloride: 104 mmol/L (ref 98–107)
Co2: 25 mmol/L (ref 21–32)
Creatinine: 0.66 mg/dL (ref 0.60–1.30)
EGFR (African American): >60
EGFR (Non-African Amer.): >60
Glucose: 138 mg/dL — ABNORMAL HIGH (ref 65–99)
Osmolality: 267 (ref 275–301)
Potassium: 3.7 mmol/L (ref 3.5–5.1)
Sodium: 133 mmol/L — ABNORMAL LOW (ref 136–145)

## 2014-01-12 LAB — RETICULOCYTES
Absolute Retic Count: 0.0699 10*6/uL (ref 0.019–0.186)
Reticulocyte: 1.6 % (ref 0.4–3.1)

## 2014-01-12 LAB — FERRITIN: Ferritin (ARMC): 6 ng/mL — ABNORMAL LOW (ref 8–388)

## 2014-01-12 LAB — TROPONIN I
Troponin-I: 0.07 ng/mL — ABNORMAL HIGH
Troponin-I: 0.05 ng/mL

## 2014-01-13 LAB — BASIC METABOLIC PANEL WITH GFR
Anion Gap: 7
BUN: 9 mg/dL
Calcium, Total: 9.4 mg/dL
Chloride: 100 mmol/L
Co2: 27 mmol/L
Creatinine: 0.77 mg/dL
EGFR (African American): >60
EGFR (Non-African Amer.): >60
Glucose: 115 mg/dL — ABNORMAL HIGH
Osmolality: 268
Potassium: 3.6 mmol/L
Sodium: 134 mmol/L — ABNORMAL LOW

## 2014-01-13 LAB — CBC WITH DIFFERENTIAL/PLATELET
Basophil #: 0.1 10*3/uL (ref 0.0–0.1)
Basophil %: 1.2 %
Eosinophil #: 0.1 10*3/uL (ref 0.0–0.7)
Eosinophil %: 2.9 %
HCT: 30.1 % — ABNORMAL LOW (ref 35.0–47.0)
HGB: 9.3 g/dL — ABNORMAL LOW (ref 12.0–16.0)
Lymphocyte #: 0.9 10*3/uL — ABNORMAL LOW (ref 1.0–3.6)
Lymphocyte %: 19 %
MCH: 21.2 pg — ABNORMAL LOW (ref 26.0–34.0)
MCHC: 30.8 g/dL — ABNORMAL LOW (ref 32.0–36.0)
MCV: 69 fL — ABNORMAL LOW (ref 80–100)
Monocyte #: 0.8 x10 3/mm (ref 0.2–0.9)
Monocyte %: 18.1 %
Neutrophil #: 2.7 10*3/uL (ref 1.4–6.5)
Neutrophil %: 58.8 %
Platelet: 208 10*3/uL (ref 150–440)
RBC: 4.37 10*6/uL (ref 3.80–5.20)
RDW: 20.5 % — ABNORMAL HIGH (ref 11.5–14.5)
WBC: 4.6 10*3/uL (ref 3.6–11.0)

## 2014-01-13 LAB — TROPONIN I: Troponin-I: 0.03 ng/mL

## 2014-01-13 LAB — LIPID PANEL
Cholesterol: 160 mg/dL
HDL Cholesterol: 39 mg/dL — ABNORMAL LOW
Ldl Cholesterol, Calc: 101 mg/dL — ABNORMAL HIGH
Triglycerides: 102 mg/dL
VLDL Cholesterol, Calc: 20 mg/dL

## 2014-01-13 LAB — HEMOGLOBIN A1C: Hemoglobin A1C: 7.8 % — ABNORMAL HIGH (ref 4.2–6.3)

## 2014-02-19 ENCOUNTER — Emergency Department: Payer: Self-pay | Admitting: Emergency Medicine

## 2014-02-20 LAB — COMPREHENSIVE METABOLIC PANEL
Albumin: 3.1 g/dL — ABNORMAL LOW (ref 3.4–5.0)
Alkaline Phosphatase: 82 U/L
Anion Gap: 5 — ABNORMAL LOW (ref 7–16)
BUN: 16 mg/dL (ref 7–18)
Bilirubin,Total: 0.2 mg/dL (ref 0.2–1.0)
Calcium, Total: 9 mg/dL (ref 8.5–10.1)
Chloride: 104 mmol/L (ref 98–107)
Co2: 27 mmol/L (ref 21–32)
Creatinine: 0.97 mg/dL (ref 0.60–1.30)
EGFR (African American): >60
EGFR (Non-African Amer.): >60
Glucose: 142 mg/dL — ABNORMAL HIGH (ref 65–99)
Osmolality: 276 (ref 275–301)
Potassium: 3.9 mmol/L (ref 3.5–5.1)
SGOT(AST): 18 U/L (ref 15–37)
SGPT (ALT): 16 U/L (ref 12–78)
Sodium: 136 mmol/L (ref 136–145)
Total Protein: 7.9 g/dL (ref 6.4–8.2)

## 2014-02-20 LAB — TROPONIN I
Troponin-I: 0.02 ng/mL
Troponin-I: 0.03 ng/mL

## 2014-02-20 LAB — CBC
HCT: 27.2 % — ABNORMAL LOW (ref 35.0–47.0)
HGB: 8.1 g/dL — ABNORMAL LOW (ref 12.0–16.0)
MCH: 20.5 pg — ABNORMAL LOW (ref 26.0–34.0)
MCHC: 29.7 g/dL — ABNORMAL LOW (ref 32.0–36.0)
MCV: 69 fL — ABNORMAL LOW (ref 80–100)
Platelet: 326 10*3/uL (ref 150–440)
RBC: 3.93 10*6/uL (ref 3.80–5.20)
RDW: 20.3 % — ABNORMAL HIGH (ref 11.5–14.5)
WBC: 6.4 10*3/uL (ref 3.6–11.0)

## 2014-05-01 ENCOUNTER — Emergency Department: Payer: Self-pay | Admitting: Emergency Medicine

## 2015-02-07 ENCOUNTER — Emergency Department: Payer: Self-pay | Admitting: Emergency Medicine

## 2015-03-24 NOTE — Discharge Summary (Signed)
PATIENT NAME:  [name], [name] DATE OF BIRTH:  1966/11/09  PRIMARY CARE PHYSICIAN: Nonlocal.   DISCHARGE DIAGNOSES: Uncontrolled hypertension, chest pain, hyperlipidemia, iron deficiency anemia, noncompliance,   HISTORY OF PRESENTING ILLNESS: A 49 year old female with history of diabetes, hypertension, noncompliance with her medication for the last 1 year due to financial reasons, presented with shortness of breath for 1 day. She noted that she had been in her usual state of health, but today developed shortness of breath associated with mild chest pain, centrally located, waxing and waning in nature, aching, with pain scale of 1 to 2. It was nonradiating. She has been off her medication for the last 1 year due to financial reasons. She has not followed up with PCP either. Upon evaluation in the Emergency Department, noticed having systolic blood pressure 195. She received hydralazine with improvement in the blood pressure up to 175/89 and her symptoms resolved. She was saturating 98% on room air and she was admitted on the medical service for uncontrolled hypertension, hypertensive urgency.   HOSPITAL COURSE AND STAY: For accelerated hypertension, she was started on hydrochlorothiazide, lisinopril and amlodipine, and it helped to bring down the blood pressure nicely in the safe range.   Other medical issues: 1. Chest tightness. Troponin was borderline due to uncontrolled blood pressure. EKG was the same as before, what we had in the past 1 to 2 years ago in our system and chest pain resolved after blood pressure was coming down, so no further workup was done at this time.  2. Iron deficiency anemia. We replaced orally and given the prescription.  3. Hyperlipidemia. Started on statin and given the prescription.  4. Diabetes mellitus. HbA1c 6.9. Advised her diet control and advised to follow up with primary care physician.  5. Noncompliance. She had financial issues and that is why she was not  following her primary care physician and she was not taking any medication. She was advised by the care manager to start following with [facility]. I gave her prescription of up to 11 refills with $4 plan medications so she can buy it from any participating pharmacies with this plan.   LABORATORY DATA: Important lab results during the hospital stay: Glucose 135. BNP 199. Iron 24. BUN 9, creatinine 0.84. Sodium 137, potassium 3.7, chloride 104, CO2 of 25. VLDL 26, LDL 170, iron binding capacity 4.7, unbound iron binding capacity 403, iron saturation 6. Cholesterol serum 255, triglyceride 132, HDL 59, hemoglobin A1c 6.9 and ferritin 5. Troponin 0.07 and the next one 0.11. WBC 7.7, hemoglobin 9.6, platelet count 301, MCV 72. Urinalysis grossly negative.   CONDITION ON DISCHARGE: Stable.   CODE STATUS: Full code.   MEDICATIONS ON DISCHARGE: Lisinopril 40 mg oral tablet once a day, amlodipine 10 mg oral tablet once a day, hydrochlorothiazide 50 mg oral tablet once a day, ferrous sulfate 325 mg oral tablet 2 times a day, docusate sodium 100 mg oral capsule 2 times a day as needed, simvastatin 20 mg oral tablet once a day at bedtime.   HOME HEALTH: No.   HOME OXYGEN ON DISCHARGE: No.   DIET ON DISCHARGE: Advised low-sodium, low-fat, low-cholesterol, carbohydrate -controlled ADA diet. Consistency: Regular. Diet special instructions: Strictly no sugary drinks, no added salt, no cheesy or oily food. Need to lose at least 15 to 20 pounds weight to prevent getting diabetes and better control of blood pressure.   ACTIVITY LIMITATIONS: None.  FOLLOWUP: Advised timeframe to follow up within 2 to 4 weeks with  [facility].   Total time spent in discharge: 45 minutes.   ____________________________ [name] [name], MD  vgv:es D: 12/09/2012 13:20:00 ET T: 12/09/2012 13:59:03 ET JOB#: [id number] cc: [name]. [name], MD, <Dictator> cc: [facility]    [facility] MD ELECTRONICALLY SIGNED 01/12/2013 17:21

## 2015-03-24 NOTE — H&P (Signed)
PATIENT NAME:  Wendy PenceMCGHEE, Silvia E MR#:  [id number] DATE OF BIRTH:  October 13, 1966  DATE OF ADMISSION:  12/04/2012  PRIMARY CARE PHYSICIAN: [facility]   CODE STATUS: FULL CODE.   CHIEF COMPLAINT: Shortness of breath.   HISTORY OF PRESENT ILLNESS: The patient is a 49 year old female with history of diabetes mellitus, hypertension, noncompliance with medications for the last 1 year due to financial reasons, presents with shortness of breath for 1 day. She notes that she has been in her usual state of health but today developed shortness of breath associated with mild chest pain, centrally located, waxing and waning in nature, aching with a pain scale of 1 to 2. It was nonradiating. She has been off all of her medications for at least 1 year due to financial reasons. She has not followed up with her PCP either. Upon evaluation in the Emergency Department, she was noted to have systolic blood pressure 195.  She received hydralazine with improvement of blood pressure; at this time, it is 175/89, and all her symptoms have resolved. She is sating 98% on room air. We are called to admit and initiate therapy. The most recent medication review I have was when the patient was discharged in 2012. At that time, she was on 4 medications for hypertension and 2 for diabetes mellitus control. The patient admits to some blurred vision over the last few months which has been worsening. She admits to intermittent palpitations. She admits to dependent edema in the ankles, denies orthopnea or dyspnea on exertion.   PAST MEDICAL HISTORY: 1. Diabetes mellitus, noninsulin-dependent as of 2012.  2. Hypertension.  3. History of bradycardia.  4. Microcytic anemia, iron deficient.  5. Obesity.  6. Depression.  7. Hyperlipidemia.   PAST SURGICAL HISTORY: None.   MEDICATIONS: None.  ALLERGIES: No known drug allergies.   FAMILY HISTORY: Notable for hypertension, diabetes, congestive heart failure and  kidney disease. She denies history of MI, CVA or cancers.   SOCIAL HISTORY: Denies tobacco, alcohol, or illicit drug use. She works as a LawyerCNA.   REVIEW OF SYSTEMS:   CONSTITUTIONAL: Denies fever or fatigue.  EYES: Admits to blurred vision that is worsening, denies eye pain.  ENT: Denies tinnitus, ear pain, epistaxis.  RESPIRATORY: She denies cough, wheeze, painful respirations.  CARDIOVASCULAR: Denies orthopnea. Does admit to chest pain as above. Denies syncope. Does admit to palpitations.  GASTROINTESTINAL: No nausea, vomiting, diarrhea, abdominal pain, hematemesis, melena or hematochezia.  GENITOURINARY: Admits to dysuria, some urgency and increased frequency.  ENDOCRINE: Admits to increased thirst, increased urination.  INTEGUMENTARY: No new skin rashes.  MUSCULOSKELETAL: Denies muscle pains, joint pains or joint swelling.  NEUROLOGICAL: Denies paresthesias, dysarthria, headaches.  PSYCHIATRIC: Denies anxiety or depression.   PHYSICAL EXAMINATION: VITAL SIGNS: Temperature 98, heart rate 55, respirations 18, blood pressure initially 195/92 currently 175/89. Sating 98% on room air.  GENERAL: A well-appearing Caucasian female, obese, in no apparent distress.  PSYCHIATRIC: Awake, alert, oriented x3. Judgment and insight appear intact.  EYES: Pupils are equal, round, and reactive to light and accommodation. Anicteric sclerae. Funduscopic exam was performed; no papilledema appreciated.  ENT: Normal external ears and nares. Posterior oropharynx is clear without erythema, exudate or thrush.  CARDIOVASCULAR: S1, S2, regular rate and rhythm. No murmurs appreciated. No pretibial edema.  PULMONARY: Clear to auscultation bilaterally. No wheezes, rales, or rhonchi. Normal effort.  ABDOMEN: Rotund, soft, nontender, nondistended with normal bowel sounds. There is no hepatomegaly appreciated.  SKIN: Warm and  dry. No rashes appreciated.  MUSCULOSKELETAL: Full range of motion bilateral upper and lower  extremities. No clubbing or cyanosis appreciated.   LABORATORY, DIAGNOSTIC AND RADIOLOGICAL DATA:  White count 7.7, hemoglobin 9.6, hematocrit 30.8, platelets 301 with an MCV of 72. This is decreased from 79 in 2012.  Glucose is 135, BUN is 9, creatinine 0.84, sodium is 137, potassium is 3.7, chloride is 104, bicarb is 25, calcium is 9.5, anion gap is 8, osmolality is 275.  B-type natriuretic peptide is 199, which is very slightly elevated.  Troponin is 0.07.  CK is 81, CK-MB 0.5.  EMS EKG strip shows normal sinus rhythm at 67 beats per minute.   ASSESSMENT/PLAN: The patient is a 49 year old female presenting with shortness of breath, found to have accelerated hypertension with elevated troponin.   1. Accelerated hypertension: The patient has been off her medications. At this time, we will go ahead and restart hydrochlorothiazide, lisinopril and amlodipine. Will have p.r.n. hydralazine for systolic blood pressures greater than 180.  I will check an EKG.  2. Troponin elevation, very mild: This is likely due to the accelerated hypertension. We will cycle her cardiac enzymes and check an EKG.  3. Diabetes mellitus: Given her complaints of increased thirst, some polyuria and polydipsia, I am concerned that her diabetes mellitus might have progressed. Her last A1c was 6.9 in March 2012, at which point she was on oral hypoglycemics. We will go ahead and check a repeat hemoglobin A1c, and if her hemoglobin A1c is less than 7, we will reinstitute oral hypoglycemics. She might need two agents, which is what she was on in the past. However, if it is greater than 7, she most likely will need insulin with some insulin education.  4. Dysuria: We will check a urinalysis and urine culture. If positive, we will start antibiotics.   She might have a urinary tract infection if she has some uncontrolled diabetes mellitus and increased glucosuria.  5. Anemia, chronic iron deficient: This was noted on her last  hospitalization. I reviewed the iron studies at that time, which seemed to be consistent with either anemia of chronic disease versus early iron deficiency anemia. I will empirically start oral iron supplementation and recheck iron studies. I will check stool Hemoccult. She can follow up as an outpatient. She has elevated RDW, so she might have thalassemia, but the MCV has worsened over the last year. She denies any melena. Hyperlipidemia: I will check a lipid profile.  6. History of bradycardia: This persists. We will avoid beta blockers.  7. Obesity: Dietary counseling was provided. 8. Depression: Stable.  9. Disposition: The patient is being admitted for management of accelerated hypertension.  10. Prophylaxis: Lovenox.   CODE STATUS:  FULL CODE.     TIME SPENT COORDINATING ADMISSION: 45 minutes.   ____________________________ Aurther Loft, DO aeo:cb D: 12/04/2012 07:19:31 ET T: 12/04/2012 08:45:35 ET JOB#: [id number]  cc: Aurther Loft, DO, <Dictator> Lan Entsminger E Ernest Orr DO ELECTRONICALLY SIGNED 01/26/2013 3:45

## 2015-03-25 NOTE — H&P (Signed)
PATIENT NAME:  [name], FAGERSTROM MR#:  [id number] DATE OF BIRTH:  1965/12/22  DATE OF ADMISSION:  01/12/2014  PRIMARY CARE PHYSICIAN:  Angus Palms, MD, at the [facility].   CHIEF COMPLAINT: Sick on and off for a month.   HISTORY OF PRESENT ILLNESS: This is a 49 year old female with history of hypertension, anxiety, depression, diabetes. She states that she has been sick on and off for a month, unable to shake off this cold. Today, she developed some chest pain like a twinge in the center of her chest, worse with coughing. No relation to exercise, comes and goes, lasts a minute or 2. She has been coughing up yellow-green phlegm, sometimes clear. In the ER, she was found to have a borderline troponin and malignant hypertension with blood pressure 218/101 on presentation. She has been off of her blood pressure medications for the past 2 months and she has also felt fatigued.   PAST MEDICAL HISTORY:  Hypertension, anxiety, depression, diabetes, obesity.   PAST SURGICAL HISTORY: None.   ALLERGIES: AMOXICILLIN.   MEDICATIONS THAT SHE IS TAKING ON A DAILY BASIS: None.  Used to be hydrochlorothiazide, lisinopril and Glucophage.   SOCIAL HISTORY: No smoking. Rare alcohol. No drug use. Works as a Lawyer.   FAMILY HISTORY: Father with congestive heart failure, hypertension, diabetes. He passed away with congestive heart failure. Mother died of MS, had hypertension and diabetes.   REVIEW OF SYSTEMS: CONSTITUTIONAL: Positive for fever. Positive for chills. Positive for sweats. Positive for fatigue.  EYES: She does have blurry vision.  EARS, NOSE, MOUTH AND THROAT: Positive for runny nose. Positive for sore throat.  CARDIOVASCULAR: Positive for chest pain.  RESPIRATORY: Occasional shortness of breath. Positive for cough, yellow phlegm. No hemoptysis.  GASTROINTESTINAL: No nausea. No vomiting. No abdominal pain. No diarrhea. No constipation. No bright red blood per rectum. No melena.  GENITOURINARY:  No burning on urination. No hematuria.  MUSCULOSKELETAL: Positive for joint pain, especially in the knees.  NEUROLOGIC: No fainting or blackouts.  INTEGUMENT: No rashes or eruptions.  PSYCHIATRIC: Positive for anxiety and depression.  ENDOCRINE: No thyroid problems.  HEMATOLOGIC AND LYMPHATIC: No history of anemia.   PHYSICAL EXAMINATION: VITAL SIGNS: On presentation, temperature 98.5, pulse 60, respirations 18, blood pressure 218/101, pulse ox 99% on room air.  GENERAL: No respiratory distress.  EYES: Conjunctivae and lids normal. Pupils equal, round and reactive to light. Extraocular muscles intact. No nystagmus.  EARS, NOSE, MOUTH AND THROAT: Tympanic membranes: Positive erythema bilaterally.  Nasal mucosa: No erythema. Throat: No erythema. No exudate seen. Lips and gums: No lesions.  NECK: No JVD. No bruits. No lymphadenopathy. No thyromegaly. No thyroid nodules palpated.  CARDIOVASCULAR: S1, S2 normal. No gallops, rubs or murmurs heard. Carotid upstroke 2+ bilaterally. No bruits.  EXTREMITIES: Dorsalis pedis pulses 2+ bilaterally.  RESPIRATORY: Coarse breath sounds bilaterally. No rhonchi, rales or wheeze heard.  ABDOMEN: Soft, nontender. No organomegaly/splenomegaly. Normoactive bowel sounds. No masses felt.  LYMPHATIC: No lymph nodes in the neck.  MUSCULOSKELETAL: No clubbing, trace edema. No cyanosis.  SKIN: No ulcers or lesions seen.  NEUROLOGIC: Cranial nerves II through XII grossly intact. Deep tendon reflexes 2+ bilateral lower extremity.  PSYCHIATRIC: The patient is alert and oriented x 3.   LABORATORY, DIAGNOSTIC AND RADIOLOGICAL DATA:  Glucose 138, BUN 8, creatinine 0.66, sodium 133, potassium 3.7, chloride 104, CO2 25, calcium 9.1. White blood cell count 5.3, H and H 9.0 and 29.6, platelet count 219. Troponin borderline at 0.07. Chest x-ray  listed as negative. EKG sinus bradycardia, 53 beats per minute, LVH, flipped T waves laterally. Looking at old EKG, we did see a sinus  bradycardia with flipped T waves laterally and inferiorly. On this EKG, similar flipped T waves inferiorly and laterally.   ASSESSMENT AND PLAN: 1.  Malignant hypertension. First thing is the cuff that they are taking the blood pressure in the ER is too small for her arm, need a larger cuff to get more accurate readings but with blood pressure that high she will need to be on medications lifelong. Low sodium diet discussed, weight loss discussed, exercise discussed. The patient already given 1 dose of lisinopril/hydrochlorothiazide. I will increase to her 40 mg of lisinopril and hydrochlorothiazide will be continued. I will give a stat dose of Norvasc and another dose of Norvasc at night. Continue to monitor on telemetry.  2.  Chest pain, atypical, with borderline troponin, abnormal EKG. The EKG is consistent with old EKG. I think the elevated troponin is likely secondary to malignant hypertension. I do not think her chest pain is cardiac in nature. I will get an echocardiogram, check cardiac enzymes and monitor on telemetry.  3.  Upper respiratory tract infection going on for a month. I will give a Z-Pak since the patient does have bulging and erythematous tympanic membrane.  4.  Diabetes. We will put on sliding scale and check a hemoglobin A1c.   5.  Obesity. Weight loss discussed.  6.  Hyponatremia. Continue to monitor.  7.  Anemia, unclear etiology. Will send off iron studies and guaiac stools.   TIME SPENT ON ADMISSION: 50 minutes.    ____________________________ Herschell Dimesichard J. Renae GlossWieting, MD rjw:cs D: 01/12/2014 18:44:00 ET T: 01/12/2014 18:58:40 ET JOB#: [id number]  cc: Herschell Dimesichard J. Renae GlossWieting, MD, <Dictator> Angus PalmsSionne George, MD Salley ScarletICHARD J Juergen Hardenbrook MD ELECTRONICALLY SIGNED 01/22/2014 12:21

## 2015-03-25 NOTE — Discharge Summary (Signed)
PATIENT NAME:  [name], [name] MR#:  [id number] DATE OF BIRTH:  May 15, 1966  DATE OF ADMISSION:  01/12/2014 DATE OF DISCHARGE:  01/13/2014  NO DICTATION    ____________________________ Wendy PigeonVaibhavkumar G. Elisabeth PigeonVachhani, MD vgv:cg D: 01/17/2014 23:05:49 ET T: 01/18/2014 01:53:38 ET JOB#: [id number]  cc: Wendy PigeonVaibhavkumar G. Elisabeth PigeonVachhani, MD, <Dictator> Altamese DillingVAIBHAVKUMAR [name] MD ELECTRONICALLY SIGNED 01/30/2014 0:49

## 2015-03-25 NOTE — Discharge Summary (Signed)
PATIENT NAME:  Wendy PenceMCGHEE, Steven E MR#:  [id number] DATE OF BIRTH:  [birth year]  DATE OF ADMISSION:  01/12/2014 DATE OF DISCHARGE:  01/13/2014  DISCHARGE DIAGNOSES: 1.  Accelerated hypertension, noncompliance to medication.  2.  Iron deficiency anemia.    CODE STATUS: Full code.   CONDITION ON DISCHARGE: Stable.   DISCHARGE MEDICATIONS:  1.  Lisinopril 40 mg oral tablet once a day.  2.  Amlodipine 5 mg once a day.  3.  Hydrochlorothiazide 25 mg oral once a day.  5.  Ciprofloxacin 250 mg oral tablet 2 times a day for three days.   DIET ON DISCHARGE: Low sodium. Diet consistency: Regular.   ACTIVITY: As tolerated.   TIME TO FOLLOW UP: Within 2 to 4 weeks, routine follow-up with primary care physician.   HISTORY OF PRESENTING ILLNESS: A 49 year old female with history of hypertension, anxiety, depression, diabetes. She was sick on and off for the last one month with cold-like symptoms, developed some chest pain. Also had some yellow-green phlegm. She came to the Emergency Room.   HOSPITAL COURSE AND STAY:  1.  Came with uncontrolled hypertension. She was not taking medication for the last two months. She had some insurance issues, but after starting oral medication, blood pressure came under control.  2.  Diabetes. Elevated HbA1c, gave prescription for metformin.  3.  Iron-deficiency anemia. Advised to her iron tablets after discharge.  4.  Chronic bronchitis. We gave Keflex on discharge.   IMPORTANT LABORATORY RESULTS IN THE HOSPITAL: Troponin was 0.07 on admission. WBC 5.3, hemoglobin 9, and platelet count was 218. Creatinine 0.66. Sodium 133, potassium 3.7. Vitamin B12 was 424.   TOTAL TIME SPENT ON THIS DISCHARGE: 40 minutes.     ____________________________ Hope PigeonVaibhavkumar G. Elisabeth PigeonVachhani, MD vgv:cg D: 01/17/2014 23:05:36 ET T: 01/18/2014 01:40:00 ET JOB#: [id number]  cc: Hope PigeonVaibhavkumar G. Elisabeth PigeonVachhani, MD, <Dictator> Altamese DillingVAIBHAVKUMAR Avonlea Sima MD ELECTRONICALLY SIGNED 01/30/2014 0:49

## 2015-09-29 ENCOUNTER — Emergency Department: Payer: Self-pay

## 2015-09-29 ENCOUNTER — Emergency Department
Admission: EM | Admit: 2015-09-29 | Discharge: 2015-09-29 | Disposition: A | Discharge status: Home / Self Care | Payer: Self-pay | Attending: Emergency Medicine | Admitting: Emergency Medicine

## 2015-09-29 ENCOUNTER — Encounter: Payer: Self-pay | Admitting: Emergency Medicine

## 2015-09-29 DIAGNOSIS — I517 Cardiomegaly: Secondary | ICD-10-CM | POA: Insufficient documentation

## 2015-09-29 DIAGNOSIS — I10 Essential (primary) hypertension: Secondary | ICD-10-CM | POA: Insufficient documentation

## 2015-09-29 DIAGNOSIS — J029 Acute pharyngitis, unspecified: Secondary | ICD-10-CM | POA: Insufficient documentation

## 2015-09-29 DIAGNOSIS — D509 Iron deficiency anemia, unspecified: Secondary | ICD-10-CM | POA: Insufficient documentation

## 2015-09-29 DIAGNOSIS — R600 Localized edema: Secondary | ICD-10-CM | POA: Insufficient documentation

## 2015-09-29 DIAGNOSIS — E119 Type 2 diabetes mellitus without complications: Secondary | ICD-10-CM | POA: Insufficient documentation

## 2015-09-29 HISTORY — DX: Type 2 diabetes mellitus without complications: E11.9

## 2015-09-29 HISTORY — DX: Essential (primary) hypertension: I10

## 2015-09-29 LAB — BASIC METABOLIC PANEL
Anion gap: 6 (ref 5–15)
BUN: 9 mg/dL (ref 6–20)
CO2: 27 mmol/L (ref 22–32)
Calcium: 9 mg/dL (ref 8.9–10.3)
Chloride: 104 mmol/L (ref 101–111)
Creatinine, Ser: 0.65 mg/dL (ref 0.44–1.00)
GFR calc Af Amer: >60 mL/min (ref >60)
GFR calc non Af Amer: >60 mL/min (ref >60)
Glucose, Bld: 123 mg/dL — ABNORMAL HIGH (ref 65–99)
Potassium: 3.9 mmol/L (ref 3.5–5.1)
Sodium: 137 mmol/L (ref 135–145)

## 2015-09-29 LAB — URINALYSIS COMPLETE WITH MICROSCOPIC (ARMC ONLY)
Bacteria, UA: NONE SEEN
Bilirubin Urine: NEGATIVE
Glucose, UA: NEGATIVE mg/dL
Ketones, ur: NEGATIVE mg/dL
Leukocytes, UA: NEGATIVE
Nitrite: NEGATIVE
Protein, ur: NEGATIVE mg/dL
Specific Gravity, Urine: 1.01 (ref 1.005–1.030)
pH: 5 (ref 5.0–8.0)

## 2015-09-29 LAB — CBC
HCT: 27.6 % — ABNORMAL LOW (ref 35.0–47.0)
Hemoglobin: 8.4 g/dL — ABNORMAL LOW (ref 12.0–16.0)
MCH: 21.3 pg — ABNORMAL LOW (ref 26.0–34.0)
MCHC: 30.5 g/dL — ABNORMAL LOW (ref 32.0–36.0)
MCV: 70 fL — ABNORMAL LOW (ref 80.0–100.0)
Platelets: 260 10*3/uL (ref 150–440)
RBC: 3.95 MIL/uL (ref 3.80–5.20)
RDW: 20.2 % — ABNORMAL HIGH (ref 11.5–14.5)
WBC: 7.1 10*3/uL (ref 3.6–11.0)

## 2015-09-29 LAB — BRAIN NATRIURETIC PEPTIDE: B Natriuretic Peptide: 94 pg/mL (ref 0.0–100.0)

## 2015-09-29 LAB — TROPONIN I: Troponin I: 0.04 ng/mL — ABNORMAL HIGH (ref <0.031)

## 2015-09-29 MED ORDER — CIPROFLOXACIN HCL 500 MG PO TABS: 500.0000 mg | ORAL_TABLET | Freq: Two times a day (BID) | ORAL | Status: AC

## 2015-09-29 MED ORDER — AZITHROMYCIN 250 MG PO TABS
500.0000 mg | ORAL_TABLET | Freq: Once | ORAL | Status: AC
  Administered 2015-09-29: 500 mg via ORAL
  Filled 2015-09-29: qty 2

## 2015-09-29 MED ORDER — HYDROCHLOROTHIAZIDE 12.5 MG PO CAPS: 12.5000 mg | ORAL_CAPSULE | Freq: Every day | ORAL | Status: DC

## 2015-09-29 MED ORDER — LISINOPRIL 10 MG PO TABS
5.0000 mg | ORAL_TABLET | Freq: Once | ORAL | Status: AC
  Administered 2015-09-29: 5 mg via ORAL
  Filled 2015-09-29: qty 1

## 2015-09-29 MED ORDER — LISINOPRIL 5 MG PO TABS: 5.0000 mg | ORAL_TABLET | Freq: Every day | ORAL | Status: DC

## 2015-09-29 MED ORDER — HYDROCHLOROTHIAZIDE 12.5 MG PO CAPS
12.5000 mg | ORAL_CAPSULE | Freq: Every day | ORAL | Status: DC
  Administered 2015-09-29: 12.5 mg via ORAL
  Filled 2015-09-29: qty 1

## 2015-09-29 MED ORDER — AZITHROMYCIN 250 MG PO TABS: ORAL_TABLET | ORAL | Status: DC

## 2015-09-29 MED ORDER — CLONIDINE HCL 0.1 MG PO TABS
0.1000 mg | ORAL_TABLET | Freq: Once | ORAL | Status: AC
  Administered 2015-09-29: 0.1 mg via ORAL
  Filled 2015-09-29: qty 1

## 2015-09-29 MED ORDER — IRON 325 (65 FE) MG PO TABS: 1.0000 | ORAL_TABLET | Freq: Every day | ORAL | Status: DC

## 2015-09-29 NOTE — ED Provider Notes (Signed)
CSN: [id number]     Arrival date & time 09/29/15  1823 History   First MD Initiated Contact with Patient 09/29/15 1851     Chief Complaint  Patient presents with  . Sore Throat  . Nasal Congestion  . 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 She has had low-grade fevers. She denies any rashes, coughing, chest pain, shortness of breath, nausea, vomiting, diarrhea. 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 She is tolerating by mouth well.  Patient has a history of diabetes mellitus and hypertension, she has been out of her medications for the last 3 months. It is unsure what medication she was taking. She believes it may have been lisinopril and HCTZ. Patient denies any vision changes headaches or chest pain with activity. Patient states she does have a history of anemia, she was recently prescribed iron, has ran out of this medication.   Past Medical History  Diagnosis Date  . Hypertension   . Diabetes mellitus without complication (HCC)    No past surgical history on file. No family history on file. Social History  Substance Use Topics  . Smoking status: Never Smoker   . Smokeless tobacco: None  . Alcohol Use: No   OB History    No data available     Review of Systems  Constitutional: Positive for fever. Negative for chills, activity change and fatigue.  HENT: Positive for sore throat. Negative for congestion and sinus pressure.   Eyes: Negative for visual disturbance.  Respiratory: Positive for cough. Negative for chest tightness and shortness of breath.   Cardiovascular: Negative for chest pain and leg swelling.  Gastrointestinal: Negative for nausea, vomiting, abdominal pain and diarrhea.  Genitourinary: Negative for dysuria.  Musculoskeletal: Negative  for arthralgias and gait problem.  Skin: Negative for rash.  Neurological: Negative for weakness, numbness and headaches.  Hematological: Negative for adenopathy.  Psychiatric/Behavioral: Negative for behavioral problems, confusion and agitation.      Allergies  Review of patient's allergies indicates no known allergies.  Home Medications   Prior to Admission medications   Medication Sig Start Date End Date Taking? Authorizing Provider  azithromycin (ZITHROMAX Z-PAK) 250 MG tablet Take 2 tablets (500 mg) on  Day 1,  followed by 1 tablet (250 mg) once daily on Days 2 through 5. 09/29/15   Evon Slack, PA-C  Ferrous Sulfate (IRON) 325 (65 FE) MG TABS Take 1 tablet by mouth daily. 09/29/15   Evon Slack, PA-C  hydrochlorothiazide (MICROZIDE) 12.5 MG capsule Take 1 capsule (12.5 mg total) by mouth daily. 09/29/15   Evon Slack, PA-C  lisinopril (PRINIVIL,ZESTRIL) 5 MG tablet Take 1 tablet (5 mg total) by mouth daily. 09/29/15   Evon Slack, PA-C   BP 211/67 mmHg  Pulse 56  Temp(Src) 98.3 F (36.8 C) (Oral)  Resp 16  Ht  (1.676 m)  Wt 253 lb (114.76 kg)  BMI 40.85 kg/m2  SpO2 99%  LMP 09/28/2015 Physical Exam  Constitutional: She is oriented to person, place, and time. She appears well-developed and well-nourished. No distress.  HENT:  Head: Normocephalic and atraumatic.  Mouth/Throat: No trismus in the jaw. No uvula swelling. Posterior oropharyngeal erythema present. No oropharyngeal exudate, posterior oropharyngeal edema or tonsillar abscesses.  Eyes: EOM are normal. Pupils are equal, round, and reactive to  light. Right eye exhibits no discharge. Left eye exhibits no discharge.  Neck: Normal range of motion. Neck supple.  Cardiovascular: Normal rate, regular rhythm and intact distal pulses.   Pulmonary/Chest: Effort normal and breath sounds normal. No respiratory distress. She has no wheezes. She has no rales. She exhibits no tenderness.  Abdominal: Soft. She  exhibits no distension. There is no tenderness. There is no rebound.  Musculoskeletal: Normal range of motion. She exhibits edema (mild 1+ edema in the lower extremities mid tibia distal).  Lymphadenopathy:    She has cervical adenopathy (anterior cervical lymphadenopathy).  Neurological: She is alert and oriented to person, place, and time. She has normal reflexes.  Skin: Skin is warm and dry.  Psychiatric: She has a normal mood and affect. Her behavior is normal. Thought content normal.    ED Course  Procedures (including critical care time) Labs Review Labs Reviewed  CBC - Abnormal; Notable for the following:    Hemoglobin 8.4 (*)    HCT 27.6 (*)    MCV 70.0 (*)    MCH 21.3 (*)    MCHC 30.5 (*)    RDW 20.2 (*)    All other components within normal limits  BASIC METABOLIC PANEL - Abnormal; Notable for the following:    Glucose, Bld 123 (*)    All other components within normal limits  TROPONIN I - Abnormal; Notable for the following:    Troponin I 0.04 (*)    All other components within normal limits  URINALYSIS COMPLETEWITH MICROSCOPIC ([facility] ONLY) - Abnormal; Notable for the following:    Color, Urine YELLOW (*)    APPearance CLEAR (*)    Hgb urine dipstick 3+ (*)    Squamous Epithelial / LPF 0-5 (*)    All other components within normal limits  BRAIN NATRIURETIC PEPTIDE    Imaging Review Dg Chest 2 View  09/29/2015  CLINICAL DATA:  Four day history of cough and congestion. Hypertension. EXAM: CHEST  2 VIEW COMPARISON:  February 20, 2014 FINDINGS: Lungs are clear. Heart is enlarged with pulmonary vascularity within normal limits. No adenopathy. There is degenerative change in the thoracic spine. IMPRESSION: Stable cardiomegaly.  No edema or consolidation. Electronically Signed   By: Bretta BangWilliam  Woodruff III M.D.   On: 09/29/2015 21:51   I have personally reviewed and evaluated these images and lab results as part of my medical decision-making.   EKG Interpretation None       MDM   Final diagnoses:  Pharyngitis  Essential hypertension  Iron deficiency anemia    49 year old female with pharyngitis for 4-5 days. She has known contacts with positive strep pharyngitis. Patient's physical exam and symptoms consistent with strep pharyngitis. We'll treat for strep pharyngitis with azithromycin.  Patient with uncontrolled hypertension, iron deficiency anemia. She has been out of blood pressure medications and iron supplements for the last 3 months. Blood pressure was controlled and improved in the emergency department at time of discharge 211/67. Patient denies any chest pain, shortness of breath, vision changes, chest pain with exertion. EKG was normal chest x-ray showed mild cardiomegaly. Troponin was slightly elevated at 0.04, she has a history of slightly elevated troponins in the past. Patient will follow-up with PCP first of next week. Patient is given a prescription for lisinopril, hydrochlorothiazide, iron. She will follow-up for any worsening symptoms or urgent changes in her health.    Evon Slackhomas C Riyah Bardon, PA-C 09/29/15 1903  Evon Slackhomas C Asante Blanda, PA-C 09/29/15 2221  Emily FilbertJonathan E Williams, MD  09/29/15 2227 

## 2015-09-29 NOTE — ED Notes (Signed)
Patient presents to the ED with a sore throat, cough, congestion x 3-4 days.  Patient is in no obvious distress at this time.  Voice is hoarse.

## 2015-09-29 NOTE — ED Provider Notes (Signed)
EKG interpretation: Interpreted by me, sinus bradycardia with occasional PVCs, mild voltage criteria for LVH, nonspecific T-wave inversions. No evidence of acute infarction.  Emily FilbertJonathan E Verley Pariseau, MD 09/29/15 2228

## 2015-09-29 NOTE — Discharge Instructions (Signed)
Pharyngitis Pharyngitis is redness, pain, and swelling (inflammation) of your pharynx.  CAUSES  Pharyngitis is usually caused by infection. Most of the time, these infections are from viruses (viral) and are part of a cold. However, sometimes pharyngitis is caused by bacteria (bacterial). Pharyngitis can also be caused by allergies. Viral pharyngitis may be spread from person to person by coughing, sneezing, and personal items or utensils (cups, forks, spoons, toothbrushes). Bacterial pharyngitis may be spread from person to person by more intimate contact, such as kissing.  SIGNS AND SYMPTOMS  Symptoms of pharyngitis include:   Sore throat.   Tiredness (fatigue).   Low-grade fever.   Headache.  Joint pain and muscle aches.  Skin rashes.  Swollen lymph nodes.  Plaque-like film on throat or tonsils (often seen with bacterial pharyngitis). DIAGNOSIS  Your health care provider will ask you questions about your illness and your symptoms. Your medical history, along with a physical exam, is often all that is needed to diagnose pharyngitis. Sometimes, a rapid strep test is done. Other lab tests may also be done, depending on the suspected cause.  TREATMENT  Viral pharyngitis will usually get better in 3-4 days without the use of medicine. Bacterial pharyngitis is treated with medicines that kill germs (antibiotics).  HOME CARE INSTRUCTIONS   Drink enough water and fluids to keep your urine clear or pale yellow.   Only take over-the-counter or prescription medicines as directed by your health care provider:   If you are prescribed antibiotics, make sure you finish them even if you start to feel better.   Do not take aspirin.   Get lots of rest.   Gargle with 8 oz of salt water ( tsp of salt per 1 qt of water) as often as every 1-2 hours to soothe your throat.   Throat lozenges (if you are not at risk for choking) or sprays may be used to soothe your throat. SEEK MEDICAL  CARE IF:   You have large, tender lumps in your neck.  You have a rash.  You cough up green, yellow-brown, or bloody spit. SEEK IMMEDIATE MEDICAL CARE IF:   Your neck becomes stiff.  You drool or are unable to swallow liquids.  You vomit or are unable to keep medicines or liquids down.  You have severe pain that does not go away with the use of recommended medicines.  You have trouble breathing (not caused by a stuffy nose). MAKE SURE YOU:   Understand these instructions.  Will watch your condition.  Will get help right away if you are not doing well or get worse.   This information is not intended to replace advice given to you by your health care provider. Make sure you discuss any questions you have with your health care provider.   Document Released: 11/18/2005 Document Revised: 09/08/2013 Document Reviewed: 07/26/2013 Elsevier Interactive Patient Education 2016 Elsevier Inc.  DASH Eating Plan DASH stands for "Dietary Approaches to Stop Hypertension." The DASH eating plan is a healthy eating plan that has been shown to reduce high blood pressure (hypertension). Additional health benefits may include reducing the risk of type 2 diabetes mellitus, heart disease, and stroke. The DASH eating plan may also help with weight loss. WHAT DO I NEED TO KNOW ABOUT THE DASH EATING PLAN? For the DASH eating plan, you will follow these general guidelines:  Choose foods with a percent daily value for sodium of less than 5% (as listed on the food label).  Use salt-free seasonings or  herbs instead of table salt or sea salt.  Check with your health care provider or pharmacist before using salt substitutes.  Eat lower-sodium products, often labeled as "lower sodium" or "no salt added."  Eat fresh foods.  Eat more vegetables, fruits, and low-fat dairy products.  Choose whole grains. Look for the word "whole" as the first word in the ingredient list.  Choose fish and skinless chicken  or [place] more often than red meat. Limit fish, poultry, and meat to 6 oz (170 g) each day.  Limit sweets, desserts, sugars, and sugary drinks.  Choose heart-healthy fats.  Limit cheese to 1 oz (28 g) per day.  Eat more home-cooked food and less restaurant, buffet, and fast food.  Limit fried foods.  Cook foods using methods other than frying.  Limit canned vegetables. If you do use them, rinse them well to decrease the sodium.  When eating at a restaurant, ask that your food be prepared with less salt, or no salt if possible. WHAT FOODS CAN I EAT? Seek help from a dietitian for individual calorie needs. Grains Whole grain or whole wheat bread. Brown rice. Whole grain or whole wheat pasta. Quinoa, bulgur, and whole grain cereals. Low-sodium cereals. Corn or whole wheat flour tortillas. Whole grain cornbread. Whole grain crackers. Low-sodium crackers. Vegetables Fresh or frozen vegetables (raw, steamed, roasted, or grilled). Low-sodium or reduced-sodium tomato and vegetable juices. Low-sodium or reduced-sodium tomato sauce and paste. Low-sodium or reduced-sodium canned vegetables.  Fruits All fresh, canned (in natural juice), or frozen fruits. Meat and Other Protein Products Ground beef (85% or leaner), grass-fed beef, or beef trimmed of fat. Skinless chicken or [place]. Ground chicken or [place]. Pork trimmed of fat. All fish and seafood. Eggs. Dried beans, peas, or lentils. Unsalted nuts and seeds. Unsalted canned beans. Dairy Low-fat dairy products, such as skim or 1% milk, 2% or reduced-fat cheeses, low-fat ricotta or cottage cheese, or plain low-fat yogurt. Low-sodium or reduced-sodium cheeses. Fats and Oils Tub margarines without trans fats. Light or reduced-fat mayonnaise and salad dressings (reduced sodium). Avocado. Safflower, olive, or canola oils. Natural peanut or almond butter. Other Unsalted popcorn and pretzels. The items listed above may not be a complete list of  recommended foods or beverages. Contact your dietitian for more options. WHAT FOODS ARE NOT RECOMMENDED? Grains White bread. White pasta. White rice. Refined cornbread. Bagels and croissants. Crackers that contain trans fat. Vegetables Creamed or fried vegetables. Vegetables in a cheese sauce. Regular canned vegetables. Regular canned tomato sauce and paste. Regular tomato and vegetable juices. Fruits Dried fruits. Canned fruit in light or heavy syrup. Fruit juice. Meat and Other Protein Products Fatty cuts of meat. Ribs, chicken wings, bacon, sausage, bologna, salami, chitterlings, fatback, hot dogs, bratwurst, and packaged luncheon meats. Salted nuts and seeds. Canned beans with salt. Dairy Whole or 2% milk, cream, half-and-half, and cream cheese. Whole-fat or sweetened yogurt. Full-fat cheeses or blue cheese. Nondairy creamers and whipped toppings. Processed cheese, cheese spreads, or cheese curds. Condiments Onion and garlic salt, seasoned salt, table salt, and sea salt. Canned and packaged gravies. Worcestershire sauce. Tartar sauce. Barbecue sauce. Teriyaki sauce. Soy sauce, including reduced sodium. Steak sauce. Fish sauce. Oyster sauce. Cocktail sauce. Horseradish. Ketchup and mustard. Meat flavorings and tenderizers. Bouillon cubes. Hot sauce. Tabasco sauce. Marinades. Taco seasonings. Relishes. Fats and Oils Butter, stick margarine, lard, shortening, ghee, and bacon fat. Coconut, palm kernel, or palm oils. Regular salad dressings. Other Pickles and olives. Salted popcorn and pretzels. The items listed  above may not be a complete list of foods and beverages to avoid. Contact your dietitian for more information. WHERE CAN I FIND MORE INFORMATION? National Heart, Lung, and Blood Institute: [employer]   This information is not intended to replace advice given to you by your health care provider. Make sure you discuss any questions you have with  your health care provider.   Document Released: 11/07/2011 Document Revised: 12/09/2014 Document Reviewed: 09/22/2013 Elsevier Interactive Patient Education 2016 [employer].  Hypertension Hypertension, commonly called high blood pressure, is when the force of blood pumping through your arteries is too strong. Your arteries are the blood vessels that carry blood from your heart throughout your body. A blood pressure reading consists of a higher number over a lower number, such as 110/72. The higher number (systolic) is the pressure inside your arteries when your heart pumps. The lower number (diastolic) is the pressure inside your arteries when your heart relaxes. Ideally you want your blood pressure below 120/80. Hypertension forces your heart to work harder to pump blood. Your arteries may become narrow or stiff. Having untreated or uncontrolled hypertension can cause heart attack, stroke, kidney disease, and other problems. RISK FACTORS Some risk factors for high blood pressure are controllable. Others are not.  Risk factors you cannot control include:   Race. You may be at higher risk if you are African American.  Age. Risk increases with age.  Gender. Men are at higher risk than women before age 45 years. After age 21, women are at higher risk than men. Risk factors you can control include:  Not getting enough exercise or physical activity.  Being overweight.  Getting too much fat, sugar, calories, or salt in your diet.  Drinking too much alcohol. SIGNS AND SYMPTOMS Hypertension does not usually cause signs or symptoms. Extremely high blood pressure (hypertensive crisis) may cause headache, anxiety, shortness of breath, and nosebleed. DIAGNOSIS To check if you have hypertension, your health care provider will measure your blood pressure while you are seated, with your arm held at the level of your heart. It should be measured at least twice using the same arm. Certain conditions  can cause a difference in blood pressure between your right and left arms. A blood pressure reading that is higher than normal on one occasion does not mean that you need treatment. If it is not clear whether you have high blood pressure, you may be asked to return on a different day to have your blood pressure checked again. Or, you may be asked to monitor your blood pressure at home for 1 or more weeks. TREATMENT Treating high blood pressure includes making lifestyle changes and possibly taking medicine. Living a healthy lifestyle can help lower high blood pressure. You may need to change some of your habits. Lifestyle changes may include:  Following the DASH diet. This diet is high in fruits, vegetables, and whole grains. It is low in salt, red meat, and added sugars.  Keep your sodium intake below 2,300 mg per day.  Getting at least 30-45 minutes of aerobic exercise at least 4 times per week.  Losing weight if necessary.  Not smoking.  Limiting alcoholic beverages.  Learning ways to reduce stress. Your health care provider may prescribe medicine if lifestyle changes are not enough to get your blood pressure under control, and if one of the following is true:  You are 69-46 years of age and your systolic blood pressure is above 140.  You are 60 years of  age or older, and your systolic blood pressure is above 150.  Your diastolic blood pressure is above 90.  You have diabetes, and your systolic blood pressure is over 140 or your diastolic blood pressure is over 90.  You have kidney disease and your blood pressure is above 140/90.  You have heart disease and your blood pressure is above 140/90. Your personal target blood pressure may vary depending on your medical conditions, your age, and other factors. HOME CARE INSTRUCTIONS  Have your blood pressure rechecked as directed by your health care provider.   Take medicines only as directed by your health care provider. Follow the  directions carefully. Blood pressure medicines must be taken as prescribed. The medicine does not work as well when you skip doses. Skipping doses also puts you at risk for problems.  Do not smoke.   Monitor your blood pressure at home as directed by your health care provider. SEEK MEDICAL CARE IF:   You think you are having a reaction to medicines taken.  You have recurrent headaches or feel dizzy.  You have swelling in your ankles.  You have trouble with your vision. SEEK IMMEDIATE MEDICAL CARE IF:  You develop a severe headache or confusion.  You have unusual weakness, numbness, or feel faint.  You have severe chest or abdominal pain.  You vomit repeatedly.  You have trouble breathing. MAKE SURE YOU:   Understand these instructions.  Will watch your condition.  Will get help right away if you are not doing well or get worse.   This information is not intended to replace advice given to you by your health care provider. Make sure you discuss any questions you have with your health care provider.   Document Released: 11/18/2005 Document Revised: 04/04/2015 Document Reviewed: 09/10/2013 Elsevier Interactive Patient Education 2016 Elsevier Inc.  Anemia, Nonspecific Anemia is a condition in which the concentration of red blood cells or hemoglobin in the blood is below normal. Hemoglobin is a substance in red blood cells that carries oxygen to the tissues of the body. Anemia results in not enough oxygen reaching these tissues.  CAUSES  Common causes of anemia include:   Excessive bleeding. Bleeding may be internal or external. This includes excessive bleeding from periods (in women) or from the intestine.   Poor nutrition.   Chronic kidney, thyroid, and liver disease.  Bone marrow disorders that decrease red blood cell production.  Cancer and treatments for cancer.  HIV, AIDS, and their treatments.  Spleen problems that increase red blood cell  destruction.  Blood disorders.  Excess destruction of red blood cells due to infection, medicines, and autoimmune disorders. SIGNS AND SYMPTOMS   Minor weakness.   Dizziness.   Headache.  Palpitations.   Shortness of breath, especially with exercise.   Paleness.  Cold sensitivity.  Indigestion.  Nausea.  Difficulty sleeping.  Difficulty concentrating. Symptoms may occur suddenly or they may develop slowly.  DIAGNOSIS  Additional blood tests are often needed. These help your health care provider determine the best treatment. Your health care provider will check your stool for blood and look for other causes of blood loss.  TREATMENT  Treatment varies depending on the cause of the anemia. Treatment can include:   Supplements of iron, vitamin B12, or folic acid.   Hormone medicines.   A blood transfusion. This may be needed if blood loss is severe.   Hospitalization. This may be needed if there is significant continual blood loss.   Dietary changes.  Spleen removal. HOME CARE INSTRUCTIONS Keep all follow-up appointments. It often takes many weeks to correct anemia, and having your health care provider check on your condition and your response to treatment is very important. SEEK IMMEDIATE MEDICAL CARE IF:   You develop extreme weakness, shortness of breath, or chest pain.   You become dizzy or have trouble concentrating.  You develop heavy vaginal bleeding.   You develop a rash.   You have bloody or black, tarry stools.   You faint.   You vomit up blood.   You vomit repeatedly.   You have abdominal pain.  You have a fever or persistent symptoms for more than 2-3 days.   You have a fever and your symptoms suddenly get worse.   You are dehydrated.  MAKE SURE YOU:  Understand these instructions.  Will watch your condition.  Will get help right away if you are not doing well or get worse.   This information is not intended to  replace advice given to you by your health care provider. Make sure you discuss any questions you have with your health care provider.   Document Released: 12/26/2004 Document Revised: 07/21/2013 Document Reviewed: 05/14/2013 Elsevier Interactive Patient Education [employer] Elsevier Inc.

## 2015-09-29 NOTE — ED Notes (Addendum)
Notified Penni BombardKendall, RN of critical lab: Troponin 0.04

## 2016-01-21 ENCOUNTER — Encounter: Payer: Self-pay | Admitting: Emergency Medicine

## 2016-01-21 ENCOUNTER — Emergency Department
Admission: EM | Admit: 2016-01-21 | Discharge: 2016-01-21 | Disposition: A | Discharge status: Home / Self Care | Payer: Self-pay | Attending: Emergency Medicine | Admitting: Emergency Medicine

## 2016-01-21 DIAGNOSIS — I10 Essential (primary) hypertension: Secondary | ICD-10-CM | POA: Insufficient documentation

## 2016-01-21 DIAGNOSIS — Z79899 Other long term (current) drug therapy: Secondary | ICD-10-CM | POA: Insufficient documentation

## 2016-01-21 DIAGNOSIS — E119 Type 2 diabetes mellitus without complications: Secondary | ICD-10-CM | POA: Insufficient documentation

## 2016-01-21 DIAGNOSIS — M5441 Lumbago with sciatica, right side: Secondary | ICD-10-CM | POA: Insufficient documentation

## 2016-01-21 MED ORDER — LORAZEPAM 1 MG PO TABS: 1.0000 mg | ORAL_TABLET | Freq: Three times a day (TID) | ORAL | Status: AC | PRN

## 2016-01-21 MED ORDER — IBUPROFEN 800 MG PO TABS: 800.0000 mg | ORAL_TABLET | Freq: Three times a day (TID) | ORAL | Status: DC | PRN

## 2016-01-21 MED ORDER — TRAMADOL HCL 50 MG PO TABS: 50.0000 mg | ORAL_TABLET | Freq: Four times a day (QID) | ORAL | Status: DC | PRN

## 2016-01-21 NOTE — Discharge Instructions (Signed)
Sciatica Sciatica is pain, weakness, numbness, or tingling along the path of the sciatic nerve. The nerve starts in the lower back and runs down the back of each leg. The nerve controls the muscles in the lower leg and in the back of the knee, while also providing sensation to the back of the thigh, lower leg, and the sole of your foot. Sciatica is a symptom of another medical condition. For instance, nerve damage or certain conditions, such as a herniated disk or bone spur on the spine, pinch or put pressure on the sciatic nerve. This causes the pain, weakness, or other sensations normally associated with sciatica. Generally, sciatica only affects one side of the body. CAUSES  1. Herniated or slipped disc. 2. Degenerative disk disease. 3. A pain disorder involving the narrow muscle in the buttocks (piriformis syndrome). 4. Pelvic injury or fracture. 5. Pregnancy. 6. Tumor (rare). SYMPTOMS  Symptoms can vary from mild to very severe. The symptoms usually travel from the low back to the buttocks and down the back of the leg. Symptoms can include: 1. Mild tingling or dull aches in the lower back, leg, or hip. 2. Numbness in the back of the calf or sole of the foot. 3. Burning sensations in the lower back, leg, or hip. 4. Sharp pains in the lower back, leg, or hip. 5. Leg weakness. 6. Severe back pain inhibiting movement. These symptoms may get worse with coughing, sneezing, laughing, or prolonged sitting or standing. Also, being overweight may worsen symptoms. DIAGNOSIS  Your caregiver will perform a physical exam to look for common symptoms of sciatica. He or she may ask you to do certain movements or activities that would trigger sciatic nerve pain. Other tests may be performed to find the cause of the sciatica. These may include: 1. Blood tests. 2. X-rays. 3. Imaging tests, such as an MRI or CT scan. TREATMENT  Treatment is directed at the cause of the sciatic pain. Sometimes, treatment is  not necessary and the pain and discomfort goes away on its own. If treatment is needed, your caregiver may suggest: 1. Over-the-counter medicines to relieve pain. 2. Prescription medicines, such as anti-inflammatory medicine, muscle relaxants, or narcotics. 3. Applying heat or ice to the painful area. 4. Steroid injections to lessen pain, irritation, and inflammation around the nerve. 5. Reducing activity during periods of pain. 6. Exercising and stretching to strengthen your abdomen and improve flexibility of your spine. Your caregiver may suggest losing weight if the extra weight makes the back pain worse. 7. Physical therapy. 8. Surgery to eliminate what is pressing or pinching the nerve, such as a bone spur or part of a herniated disk. HOME CARE INSTRUCTIONS  1. Only take over-the-counter or prescription medicines for pain or discomfort as directed by your caregiver. 2. Apply ice to the affected area for 20 minutes, 3-4 times a day for the first 48-72 hours. Then try heat in the same way. 3. Exercise, stretch, or perform your usual activities if these do not aggravate your pain. 4. Attend physical therapy sessions as directed by your caregiver. 5. Keep all follow-up appointments as directed by your caregiver. 6. Do not wear high heels or shoes that do not provide proper support. 7. Check your mattress to see if it is too soft. A firm mattress may lessen your pain and discomfort. SEEK IMMEDIATE MEDICAL CARE IF:  1. You lose control of your bowel or bladder (incontinence). 2. You have increasing weakness in the lower back, pelvis, buttocks,  or legs. 3. You have redness or swelling of your back. 4. You have a burning sensation when you urinate. 5. You have pain that gets worse when you lie down or awakens you at night. 6. Your pain is worse than you have experienced in the past. 7. Your pain is lasting longer than 4 weeks. 8. You are suddenly losing weight without reason. MAKE SURE  YOU: 1. Understand these instructions. 2. Will watch your condition. 3. Will get help right away if you are not doing well or get worse.   This information is not intended to replace advice given to you by your health care provider. Make sure you discuss any questions you have with your health care provider.   Document Released: 11/12/2001 Document Revised: 08/09/2015 Document Reviewed: 03/29/2012 Elsevier Interactive Patient Education [employer]  Please return immediately if condition worsens. Please contact her primary physician or the physician you were given for referral. If you have any specialist physicians involved in her treatment and plan please also contact them. Thank you for using [facility].  Back Exercises If you have pain in your back, do these exercises 2-3 times each day or as told by your doctor. When the pain goes away, do the exercises once each day, but repeat the steps more times for each exercise (do more repetitions). If you do not have pain in your back, do these exercises once each day or as told by your doctor. EXERCISES Single Knee to Chest Do these steps 3-5 times in a row for each leg: 7. Lie on your back on a firm bed or the floor with your legs stretched out. 8. Bring one knee to your chest. 9. Hold your knee to your chest by grabbing your knee or thigh. 10. Pull on your knee until you feel a gentle stretch in your lower back. 11. Keep doing the stretch for 10-30 seconds. 12. Slowly let go of your leg and straighten it. Pelvic Tilt Do these steps 5-10 times in a row: 7. Lie on your back on a firm bed or the floor with your legs stretched out. 8. Bend your knees so they point up to the ceiling. Your feet should be flat on the floor. 9. Tighten your lower belly (abdomen) muscles to press your lower back against the floor. This will make your tailbone point up to the ceiling instead of pointing down to your feet or the  floor. 10. Stay in this position for 5-10 seconds while you gently tighten your muscles and breathe evenly. Cat-Cow Do these steps until your lower back bends more easily: 4. Get on your hands and knees on a firm surface. Keep your hands under your shoulders, and keep your knees under your hips. You may put padding under your knees. 5. Let your head hang down, and make your tailbone point down to the floor so your lower back is round like the back of a cat. 6. Stay in this position for 5 seconds. 7. Slowly lift your head and make your tailbone point up to the ceiling so your back hangs low (sags) like the back of a cow. 8. Stay in this position for 5 seconds. Press-Ups Do these steps 5-10 times in a row: 9. Lie on your belly (face-down) on the floor. 10. Place your hands near your head, about shoulder-width apart. 11. While you keep your back relaxed and keep your hips on the floor, slowly straighten your arms to raise the top half  of your body and lift your shoulders. Do not use your back muscles. To make yourself more comfortable, you may change where you place your hands. 12. Stay in this position for 5 seconds. 13. Slowly return to lying flat on the floor. Bridges Do these steps 10 times in a row: 8. Lie on your back on a firm surface. 9. Bend your knees so they point up to the ceiling. Your feet should be flat on the floor. 10. Tighten your butt muscles and lift your butt off of the floor until your waist is almost as high as your knees. If you do not feel the muscles working in your butt and the back of your thighs, slide your feet 1-2 inches farther away from your butt. 11. Stay in this position for 3-5 seconds. 12. Slowly lower your butt to the floor, and let your butt muscles relax. If this exercise is too easy, try doing it with your arms crossed over your chest. Belly Crunches Do these steps 5-10 times in a row: 9. Lie on your back on a firm bed or the floor with your legs  stretched out. 10. Bend your knees so they point up to the ceiling. Your feet should be flat on the floor. 11. Cross your arms over your chest. 12. Tip your chin a little bit toward your chest but do not bend your neck. 13. Tighten your belly muscles and slowly raise your chest just enough to lift your shoulder blades a tiny bit off of the floor. 14. Slowly lower your chest and your head to the floor. Back Lifts Do these steps 5-10 times in a row: 4. Lie on your belly (face-down) with your arms at your sides, and rest your forehead on the floor. 5. Tighten the muscles in your legs and your butt. 6. Slowly lift your chest off of the floor while you keep your hips on the floor. Keep the back of your head in line with the curve in your back. Look at the floor while you do this. 7. Stay in this position for 3-5 seconds. 8. Slowly lower your chest and your face to the floor. GET HELP IF:  Your back pain gets a lot worse when you do an exercise.  Your back pain does not lessen 2 hours after you exercise. If you have any of these problems, stop doing the exercises. Do not do them again unless your doctor says it is okay. GET HELP RIGHT AWAY IF:  You have sudden, very bad back pain. If this happens, stop doing the exercises. Do not do them again unless your doctor says it is okay.   This information is not intended to replace advice given to you by your health care provider. Make sure you discuss any questions you have with your health care provider.   Document Released: 12/21/2010 Document Revised: 08/09/2015 Document Reviewed: 01/12/2015 Elsevier Interactive Patient Education [employer]

## 2016-01-21 NOTE — ED Notes (Addendum)
Back pain started on Wednesday, has a hx of back injury but does not think that is what is causing her pain.  Pain starts in her lower back and radiates down her legs. Pain is 8/10.  Pain is described as a constant ache and back spasms. Coughing increases pain. Pt did take her BP medications.

## 2016-01-21 NOTE — ED Provider Notes (Signed)
Time Seen: Approximately 0 835 I have reviewed the triage notes  Chief Complaint: Back Pain   History of Present Illness: [name] is a 50 y.o. female who presents with some lower back discomfort has been occurring since Wednesday. She states it's down into the bilateral [place] on both sides and will radiate occasionally down the back of her right leg to about the knee. She states she's had some back pain before but this never been this persistent. She denies any acute injury. She states it hurts for her to move. She denies any leg weakness and has been able to ambulate normally. She denies any difficulty with urination or bowels.   Past Medical History  Diagnosis Date  . Hypertension   . Diabetes mellitus without complication (HCC)     There are no active problems to display for this patient.   History reviewed. No pertinent past surgical history.  History reviewed. No pertinent past surgical history.  Current Outpatient Rx  Name  Route  Sig  Dispense  Refill  . azithromycin (ZITHROMAX Z-PAK) 250 MG tablet      Take 2 tablets (500 mg) on  Day 1,  followed by 1 tablet (250 mg) once daily on Days 2 through 5.   6 each   0   . Ferrous Sulfate (IRON) 325 (65 FE) MG TABS   Oral   Take 1 tablet by mouth daily.   30 each   0   . hydrochlorothiazide (MICROZIDE) 12.5 MG capsule   Oral   Take 1 capsule (12.5 mg total) by mouth daily.   30 capsule   0   . ibuprofen (ADVIL,MOTRIN) 800 MG tablet   Oral   Take 1 tablet (800 mg total) by mouth every 8 (eight) hours as needed.   30 tablet   0   . lisinopril (PRINIVIL,ZESTRIL) 5 MG tablet   Oral   Take 1 tablet (5 mg total) by mouth daily.   30 tablet   0   . LORazepam (ATIVAN) 1 MG tablet   Oral   Take 1 tablet (1 mg total) by mouth every 8 (eight) hours as needed (back spasms).   12 tablet   0   . traMADol (ULTRAM) 50 MG tablet   Oral   Take 1 tablet (50 mg total) by mouth every 6 (six) hours as needed.   20 tablet   0     Allergies:  Review of patient's allergies indicates no known allergies.  Family History: History reviewed. No pertinent family history.  Social History: Social History  Substance Use Topics  . Smoking status: Never Smoker   . Smokeless tobacco: Never Used  . Alcohol Use: No     Review of Systems:   Constitutional: No fever Eyes: No visual disturbances Cardiac: No chest pain Respiratory: No shortness of breath, wheezing, or stridor Abdomen: No abdominal pain, no vomiting, No diarrhea Extremities: No peripheral edema, cyanosis Neurologic: No focal weakness, trouble with speech or swollowing Urologic: No dysuria, Hematuria, or urinary frequency   Physical Exam:  ED Triage Vitals  Enc Vitals Group     BP 01/21/16 0740 190/110 mmHg     Pulse Rate 01/21/16 0727 69     Resp 01/21/16 0727 18     Temp 01/21/16 0727 98.3 F (36.8 C)     Temp Source 01/21/16 0727 Oral     SpO2 01/21/16 0727 97 %     Weight 01/21/16 0727 238 lb (107.956 kg)  Height 01/21/16 0727  (1.676 m)     Head Cir --      Peak Flow --      Pain Score 01/21/16 0729 8     Pain Loc --      Pain Edu? --      Excl. in GC? --     General: Awake , Alert , and Oriented times 3; GCS 15 Head: Normal cephalic , atraumatic Eyes: Pupils equal , round, reactive to light Nose/Throat: No nasal drainage, patent upper airway without erythema or exudate.  Neck: Supple, Full range of motion, No anterior adenopathy or palpable thyroid masses Lungs: Clear to ascultation without wheezes , rhonchi, or rales Heart: Regular rate, regular rhythm without murmurs , gallops , or rubs Abdomen: Soft, non tender without rebound, guarding , or rigidity; bowel sounds positive and symmetric in all 4 quadrants. No organomegaly .        Extremities: 2 plus symmetric pulses. No edema, clubbing or cyanosis Neurologic: normal ambulation, Motor symmetric without deficits, sensory intact Skin: warm, dry, no  rashes Back exam shows patient's able to stand and walk up on her toes without any weakness. Reflexes 2 out of 4 throughout    ED Course: * Patient has some hypertension which I felt was most likely a pain response he just took her normal blood pressure medications at 6:30 this morning. She states overall she's had good control of her blood pressure at home and I felt this is most likely a pain response at this point. Patient will be discharged on pain medication and muscle relaxant therapy. She's been advised continue with all her other current medications at home and to follow up with her primary physician. I felt this was unlikely to be cauda equina syndrome. I did advise the patient if her pain persists and she develops leg weakness or trouble with bladder or bowel function that she may need to return here for immediate MRI. Otherwise, I felt she could follow up with her primary physician.    Assessment:  Musculoskeletal low back pain   Final Clinical Impression:   Final diagnoses:  Bilateral low back pain with right-sided sciatica     Plan:  Patient was advised to return immediately if condition worsens. Patient was advised to follow up with their primary care physician or other specialized physicians involved in their outpatient care  Outpatient management            Jennye Moccasin, MD 01/21/16 [phone]

## 2016-01-21 NOTE — ED Notes (Signed)
Pt states hx of back pain, states a "few years ago [she] hurt her back at work". Pt c/o bilateral lower back pain at this time that radiates down both of her legs. Pt denies numbness/tingling at this time. Pt ambulatory from the lobby at this time.

## 2016-04-21 ENCOUNTER — Emergency Department: Payer: Self-pay

## 2016-04-21 ENCOUNTER — Emergency Department
Admission: EM | Admit: 2016-04-21 | Discharge: 2016-04-21 | Disposition: A | Discharge status: Home / Self Care | Payer: Self-pay | Attending: Emergency Medicine | Admitting: Emergency Medicine

## 2016-04-21 ENCOUNTER — Encounter: Payer: Self-pay | Admitting: Emergency Medicine

## 2016-04-21 DIAGNOSIS — Z7984 Long term (current) use of oral hypoglycemic drugs: Secondary | ICD-10-CM | POA: Insufficient documentation

## 2016-04-21 DIAGNOSIS — J40 Bronchitis, not specified as acute or chronic: Secondary | ICD-10-CM | POA: Insufficient documentation

## 2016-04-21 DIAGNOSIS — Z79899 Other long term (current) drug therapy: Secondary | ICD-10-CM | POA: Insufficient documentation

## 2016-04-21 DIAGNOSIS — I10 Essential (primary) hypertension: Secondary | ICD-10-CM | POA: Insufficient documentation

## 2016-04-21 DIAGNOSIS — E119 Type 2 diabetes mellitus without complications: Secondary | ICD-10-CM | POA: Insufficient documentation

## 2016-04-21 DIAGNOSIS — Z791 Long term (current) use of non-steroidal anti-inflammatories (NSAID): Secondary | ICD-10-CM | POA: Insufficient documentation

## 2016-04-21 LAB — CBC WITH DIFFERENTIAL/PLATELET
Basophils Absolute: 0.1 10*3/uL (ref 0–0.1)
Basophils Relative: 1 %
Eosinophils Absolute: 0.2 10*3/uL (ref 0–0.7)
Eosinophils Relative: 3 %
HCT: 27.8 % — ABNORMAL LOW (ref 35.0–47.0)
Hemoglobin: 8.2 g/dL — ABNORMAL LOW (ref 12.0–16.0)
Lymphocytes Relative: 14 %
Lymphs Abs: 1 10*3/uL (ref 1.0–3.6)
MCH: 19.5 pg — ABNORMAL LOW (ref 26.0–34.0)
MCHC: 29.4 g/dL — ABNORMAL LOW (ref 32.0–36.0)
MCV: 66.4 fL — ABNORMAL LOW (ref 80.0–100.0)
Monocytes Absolute: 0.6 10*3/uL (ref 0.2–0.9)
Monocytes Relative: 8 %
Neutro Abs: 5.4 10*3/uL (ref 1.4–6.5)
Neutrophils Relative %: 74 %
Platelets: 279 10*3/uL (ref 150–440)
RBC: 4.18 MIL/uL (ref 3.80–5.20)
RDW: 20.9 % — ABNORMAL HIGH (ref 11.5–14.5)
WBC: 7.2 10*3/uL (ref 3.6–11.0)

## 2016-04-21 LAB — POCT PREGNANCY, URINE: Preg Test, Ur: NEGATIVE

## 2016-04-21 LAB — TROPONIN I
Troponin I: 0.06 ng/mL — ABNORMAL HIGH (ref <0.031)
Troponin I: 0.05 ng/mL — ABNORMAL HIGH (ref <0.031)

## 2016-04-21 LAB — BASIC METABOLIC PANEL
Anion gap: 6 (ref 5–15)
BUN: 13 mg/dL (ref 6–20)
CO2: 26 mmol/L (ref 22–32)
Calcium: 9.1 mg/dL (ref 8.9–10.3)
Chloride: 106 mmol/L (ref 101–111)
Creatinine, Ser: 0.73 mg/dL (ref 0.44–1.00)
GFR calc Af Amer: >60 mL/min (ref >60)
GFR calc non Af Amer: >60 mL/min (ref >60)
Glucose, Bld: 168 mg/dL — ABNORMAL HIGH (ref 65–99)
Potassium: 3.9 mmol/L (ref 3.5–5.1)
Sodium: 138 mmol/L (ref 135–145)

## 2016-04-21 MED ORDER — AZITHROMYCIN 250 MG PO TABS: ORAL_TABLET | ORAL | Status: AC

## 2016-04-21 NOTE — ED Provider Notes (Signed)
[facility] Emergency Department Provider Note  ____________________________________________    I have reviewed the triage vital signs and the nursing notes.   HISTORY  Chief Complaint Cough    HPI [name] is a 50 y.o. female with a hx of dm, who presents with complaints of cough for approximately 2 weeks. She denies fever but does report chills. She denies pleurisy. No chest pain. No shortness of breath. No diaphoresis or cp with exertion. No recent travel or calf pain or swelling. She reports multiple sick contacts with similar sx's.      Past Medical History  Diagnosis Date  . Hypertension   . Diabetes mellitus without complication (HCC)     There are no active problems to display for this patient.   History reviewed. No pertinent past surgical history.  Current Outpatient Rx  Name  Route  Sig  Dispense  Refill  . Ferrous Sulfate (IRON) 325 (65 FE) MG TABS   Oral   Take 1 tablet by mouth daily.   30 each   0   . hydrochlorothiazide (MICROZIDE) 12.5 MG capsule   Oral   Take 1 capsule (12.5 mg total) by mouth daily.   30 capsule   0   . ibuprofen (ADVIL,MOTRIN) 800 MG tablet   Oral   Take 1 tablet (800 mg total) by mouth every 8 (eight) hours as needed.   30 tablet   0   . lisinopril (PRINIVIL,ZESTRIL) 5 MG tablet   Oral   Take 1 tablet (5 mg total) by mouth daily.   30 tablet   0   . metFORMIN (GLUCOPHAGE) 500 MG tablet   Oral   Take 500 mg by mouth 2 (two) times daily with a meal.         . azithromycin (ZITHROMAX Z-PAK) 250 MG tablet      Take 2 tablets (500 mg) on  Day 1,  followed by 1 tablet (250 mg) once daily on Days 2 through 5.   6 each   0   . traMADol (ULTRAM) 50 MG tablet   Oral   Take 1 tablet (50 mg total) by mouth every 6 (six) hours as needed.   20 tablet   0     Allergies Review of patient's allergies indicates no known allergies.  History reviewed. No pertinent family  history.  Social History Social History  Substance Use Topics  . Smoking status: Never Smoker   . Smokeless tobacco: Never Used  . Alcohol Use: No    Review of Systems  Constitutional: Negative for fever. +chills Eyes: Negative for redness ENT: Negative for sore throat Cardiovascular: Negative for chest pain Respiratory: Negative for shortness of breath. +cough Gastrointestinal: Negative for abdominal pain Genitourinary: Negative for dysuria. Musculoskeletal: Negative for back pain. Skin: Negative for rash. Neurological: Negative for headache Psychiatric: no anxiety    ____________________________________________   PHYSICAL EXAM:  VITAL SIGNS: ED Triage Vitals  Enc Vitals Group     BP 04/21/16 1128 217/84 mmHg     Pulse Rate 04/21/16 1128 59     Resp 04/21/16 1128 20     Temp 04/21/16 1128 98.3 F (36.8 C)     Temp Source 04/21/16 1128 Oral     SpO2 04/21/16 1128 97 %     Weight 04/21/16 1128 230 lb (104.327 kg)     Height 04/21/16 1128  (1.676 m)     Head Cir --      Peak Flow --  Pain Score 04/21/16 1128 0     Pain Loc --      Pain Edu? --      Excl. in GC? --      Constitutional: Alert and oriented. Well appearing and in no distress. Pleasant and interactive Eyes: Conjunctivae are normal. No erythema or injection ENT   Head: Normocephalic and atraumatic.   Mouth/Throat: Mucous membranes are moist. Cardiovascular: Normal rate, regular rhythm. Normal and symmetric distal pulses are present in the upper extremities. No murmurs or rubs  Respiratory: Normal respiratory effort without tachypnea nor retractions. Breath sounds are clear and equal bilaterally.  Gastrointestinal: Soft and non-tender in all quadrants. No distention. There is no CVA tenderness. Genitourinary: deferred Musculoskeletal: Nontender with normal range of motion in all extremities. No lower extremity tenderness nor edema. Neurologic:  Normal speech and language. No gross  focal neurologic deficits are appreciated. Skin:  Skin is warm, dry and intact. No rash noted. Psychiatric: Mood and affect are normal. Patient exhibits appropriate insight and judgment.  ____________________________________________    LABS (pertinent positives/negatives)  Labs Reviewed  CBC WITH DIFFERENTIAL/PLATELET - Abnormal; Notable for the following:    Hemoglobin 8.2 (*)    HCT 27.8 (*)    MCV 66.4 (*)    MCH 19.5 (*)    MCHC 29.4 (*)    RDW 20.9 (*)    All other components within normal limits  BASIC METABOLIC PANEL - Abnormal; Notable for the following:    Glucose, Bld 168 (*)    All other components within normal limits  TROPONIN I - Abnormal; Notable for the following:    Troponin I 0.06 (*)    All other components within normal limits  TROPONIN I - Abnormal; Notable for the following:    Troponin I 0.05 (*)    All other components within normal limits  POCT PREGNANCY, URINE    ____________________________________________   EKG  ED ECG REPORT I, Jene EveryKINNER, Cannon Quinton, the attending physician, personally viewed and interpreted this ECG.  Date: 04/21/2016 EKG Time: 12:34 PM Rate: 74 Rhythm: normal sinus rhythm QRS Axis: normal Intervals: normal ST/T Wave abnormalities: Nonspecific changes Conduction Disturbances: Incomplete right bundle branch    ____________________________________________    RADIOLOGY  Normal chest x-ray  ____________________________________________   PROCEDURES  Procedure(s) performed: none  Critical Care performed: none  ____________________________________________   INITIAL IMPRESSION / ASSESSMENT AND PLAN / ED COURSE  Pertinent labs & imaging results that were available during my care of the patient were reviewed by me and considered in my medical decision making (see chart for details).  Patient well appearing and in no distress. EKG is unremarkable. She has NO chest pain. HPI c/w URI likely viral. Mildly elevated  troponin appears chronic for her, we will recheck. If negative, given well appearing and benign exam we will d/c with close PCP follow up  ____________________________________________   FINAL CLINICAL IMPRESSION(S) / ED DIAGNOSES  Final diagnoses:  Bronchitis          Jene Everyobert Gracelyn Coventry, MD 04/21/16 1616

## 2016-04-21 NOTE — ED Notes (Addendum)
Onset 2 weeks ago with cough, congestion, runny nose. Symptoms were getting better until yesterday when she started with increase in productive cough and chills.   Denies chest pain or discomfort.

## 2016-04-21 NOTE — Discharge Instructions (Signed)
Upper Respiratory Infection, Adult Most upper respiratory infections (URIs) are a viral infection of the air passages leading to the lungs. A URI affects the nose, throat, and upper air passages. The most common type of URI is nasopharyngitis and is typically referred to as "the common cold." URIs run their course and usually go away on their own. Most of the time, a URI does not require medical attention, but sometimes a bacterial infection in the upper airways can follow a viral infection. This is called a secondary infection. Sinus and middle ear infections are common types of secondary upper respiratory infections. Bacterial pneumonia can also complicate a URI. A URI can worsen asthma and chronic obstructive pulmonary disease (COPD). Sometimes, these complications can require emergency medical care and may be life threatening.  CAUSES Almost all URIs are caused by viruses. A virus is a type of germ and can spread from one person to another.  RISKS FACTORS You may be at risk for a URI if:   You smoke.   You have chronic heart or lung disease.  You have a weakened defense (immune) system.   You are very young or very old.   You have nasal allergies or asthma.  You work in crowded or poorly ventilated areas.  You work in health care facilities or schools. SIGNS AND SYMPTOMS  Symptoms typically develop 2-3 days after you come in contact with a cold virus. Most viral URIs last 7-10 days. However, viral URIs from the influenza virus (flu virus) can last 14-18 days and are typically more severe. Symptoms may include:   Runny or stuffy (congested) nose.   Sneezing.   Cough.   Sore throat.   Headache.   Fatigue.   Fever.   Loss of appetite.   Pain in your forehead, behind your eyes, and over your cheekbones (sinus pain).  Muscle aches.  DIAGNOSIS  Your health care provider may diagnose a URI by:  Physical exam.  Tests to check that your symptoms are not due to  another condition such as:  Strep throat.  Sinusitis.  Pneumonia.  Asthma. TREATMENT  A URI goes away on its own with time. It cannot be cured with medicines, but medicines may be prescribed or recommended to relieve symptoms. Medicines may help:  Reduce your fever.  Reduce your cough.  Relieve nasal congestion. HOME CARE INSTRUCTIONS   Take medicines only as directed by your health care provider.   Gargle warm saltwater or take cough drops to comfort your throat as directed by your health care provider.  Use a warm mist humidifier or inhale steam from a shower to increase air moisture. This may make it easier to breathe.  Drink enough fluid to keep your urine clear or pale yellow.   Eat soups and other clear broths and maintain good nutrition.   Rest as needed.   Return to work when your temperature has returned to normal or as your health care provider advises. You may need to stay home longer to avoid infecting others. You can also use a face mask and careful hand washing to prevent spread of the virus.  Increase the usage of your inhaler if you have asthma.   Do not use any tobacco products, including cigarettes, chewing tobacco, or electronic cigarettes. If you need help quitting, ask your health care provider. PREVENTION  The best way to protect yourself from getting a cold is to practice good hygiene.   Avoid oral or hand contact with people with cold   symptoms.   Wash your hands often if contact occurs.  There is no clear evidence that vitamin C, vitamin E, echinacea, or exercise reduces the chance of developing a cold. However, it is always recommended to get plenty of rest, exercise, and practice good nutrition.  SEEK MEDICAL CARE IF:   You are getting worse rather than better.   Your symptoms are not controlled by medicine.   You have chills.  You have worsening shortness of breath.  You have brown or red mucus.  You have yellow or brown nasal  discharge.  You have pain in your face, especially when you bend forward.  You have a fever.  You have swollen neck glands.  You have pain while swallowing.  You have white areas in the back of your throat. SEEK IMMEDIATE MEDICAL CARE IF:   You have severe or persistent:  Headache.  Ear pain.  Sinus pain.  Chest pain.  You have chronic lung disease and any of the following:  Wheezing.  Prolonged cough.  Coughing up blood.  A change in your usual mucus.  You have a stiff neck.  You have changes in your:  Vision.  Hearing.  Thinking.  Mood. MAKE SURE YOU:   Understand these instructions.  Will watch your condition.  Will get help right away if you are not doing well or get worse.   This information is not intended to replace advice given to you by your health care provider. Make sure you discuss any questions you have with your health care provider.   Document Released: 05/14/2001 Document Revised: 04/04/2015 Document Reviewed: 02/23/2014 Elsevier Interactive Patient Education 2016 [employer]  

## 2016-04-21 NOTE — ED Notes (Signed)
Per pt, BHP always ruins high, pt in no NAD at time of D/C

## 2016-06-30 ENCOUNTER — Encounter: Payer: Self-pay | Admitting: Emergency Medicine

## 2016-06-30 ENCOUNTER — Emergency Department
Admission: EM | Admit: 2016-06-30 | Discharge: 2016-06-30 | Disposition: A | Discharge status: Home / Self Care | Payer: Self-pay | Attending: Emergency Medicine | Admitting: Emergency Medicine

## 2016-06-30 ENCOUNTER — Emergency Department: Payer: Self-pay

## 2016-06-30 DIAGNOSIS — Z7984 Long term (current) use of oral hypoglycemic drugs: Secondary | ICD-10-CM | POA: Insufficient documentation

## 2016-06-30 DIAGNOSIS — I10 Essential (primary) hypertension: Secondary | ICD-10-CM | POA: Insufficient documentation

## 2016-06-30 DIAGNOSIS — M5441 Lumbago with sciatica, right side: Secondary | ICD-10-CM | POA: Insufficient documentation

## 2016-06-30 DIAGNOSIS — E119 Type 2 diabetes mellitus without complications: Secondary | ICD-10-CM | POA: Insufficient documentation

## 2016-06-30 DIAGNOSIS — Z79899 Other long term (current) drug therapy: Secondary | ICD-10-CM | POA: Insufficient documentation

## 2016-06-30 MED ORDER — NAPROXEN 500 MG PO TABS: 500.0000 mg | ORAL_TABLET | Freq: Two times a day (BID) | ORAL | 0 refills | Status: DC

## 2016-06-30 MED ORDER — OXYCODONE-ACETAMINOPHEN 5-325 MG PO TABS: 1.0000 | ORAL_TABLET | ORAL | 0 refills | Status: DC | PRN

## 2016-06-30 MED ORDER — CLONIDINE HCL 0.1 MG PO TABS
0.1000 mg | ORAL_TABLET | Freq: Once | ORAL | Status: AC
  Administered 2016-06-30: 0.1 mg via ORAL
  Filled 2016-06-30: qty 1

## 2016-06-30 NOTE — ED Provider Notes (Signed)
[facility] Emergency Department Provider Note  ____________________________________________  Time seen: Approximately 9:43 AM  I have reviewed the triage vital signs and the nursing notes.   HISTORY  Chief Complaint Back Pain    HPI [name] is a 50 y.o. female resents for evaluation of low back pain radiating across and down the right leg with some numbness. States that she feels like her leg is dragging. Her past medical history is significant for hypertension and which was elevated today. Patient denies any headache dizziness. No vertigo.   Past Medical History:  Diagnosis Date  . Diabetes mellitus without complication (HCC)   . Hypertension     There are no active problems to display for this patient.   History reviewed. No pertinent surgical history.  Prior to Admission medications   Medication Sig Start Date End Date Taking? Authorizing Provider  Ferrous Sulfate (IRON) 325 (65 FE) MG TABS Take 1 tablet by mouth daily. 09/29/15   Evon Slack, PA-C  hydrochlorothiazide (MICROZIDE) 12.5 MG capsule Take 1 capsule (12.5 mg total) by mouth daily. 09/29/15   Evon Slack, PA-C  lisinopril (PRINIVIL,ZESTRIL) 5 MG tablet Take 1 tablet (5 mg total) by mouth daily. 09/29/15   Evon Slack, PA-C  metFORMIN (GLUCOPHAGE) 500 MG tablet Take 500 mg by mouth 2 (two) times daily with a meal.    Historical Provider, MD  naproxen (NAPROSYN) 500 MG tablet Take 1 tablet (500 mg total) by mouth 2 (two) times daily with a meal. 06/30/16   Charmayne Sheer Oriah Leinweber, PA-C  oxyCODONE-acetaminophen (ROXICET) 5-325 MG tablet Take 1-2 tablets by mouth every 4 (four) hours as needed for severe pain. 06/30/16   Evangeline Dakin, PA-C    Allergies Review of patient's allergies indicates no known allergies.  No family history on file.  Social History Social History  Substance Use Topics  . Smoking status: Never Smoker  . Smokeless tobacco: Never Used  . Alcohol  use No    Review of Systems Constitutional: No fever/chills Eyes: No visual changes. ENT: No sore throat. Cardiovascular: Denies chest pain. Respiratory: Denies shortness of breath. Gastrointestinal: No abdominal pain.  No nausea, no vomiting.  No diarrhea.  No constipation. Genitourinary: Negative for dysuria. Musculoskeletal: Negative for back pain. Skin: Negative for rash. Neurological: Negative for headaches, focal weakness or numbness.  10-point ROS otherwise negative.  ____________________________________________   PHYSICAL EXAM:  VITAL SIGNS: ED Triage Vitals [06/30/16 0942]  Enc Vitals Group     BP      Pulse      Resp      Temp      Temp src      SpO2      Weight      Height      Head Circumference      Peak Flow      Pain Score 5     Pain Loc      Pain Edu?      Excl. in GC?     Constitutional: Alert and oriented. Well appearing and in no acute distress. Eyes: Conjunctivae are normal. PERRL. EOMI. Head: Atraumatic. Nose: No congestion/rhinnorhea. Mouth/Throat: Mucous membranes are moist.  Oropharynx non-erythematous. Neck: No stridor. No carotid bruits noted.  Cardiovascular: Normal rate, regular rhythm. Grossly normal heart sounds.  Good peripheral circulation. Respiratory: Normal respiratory effort.  No retractions. Lungs CTAB. Gastrointestinal: Soft and nontender. No distention. No abdominal bruits. No CVA tenderness. Musculoskeletal: No lower extremity tenderness nor edema.  No joint effusions.Point tenderness only to the lumbar spine. Neurologic:  Normal speech and language. No gross focal neurologic deficits are appreciated. No gait instability. Skin:  Skin is warm, dry and intact. No rash noted. Psychiatric: Mood and affect are normal. Speech and behavior are normal.  ____________________________________________   LABS (all labs ordered are listed, but only abnormal results are displayed)  Labs Reviewed - No data to  display ____________________________________________  EKG   ____________________________________________  RADIOLOGY  IMPRESSION: 1. Moderate facet hypertrophy with 4 mm anterolisthesis at L4-5 results in uncovering of the disc and moderate central canal stenosis. 2. Moderate foraminal stenosis bilaterally at L4-5 is worse on the right. 3. Moderate left and mild right foraminal narrowing at L3-4. 4. Mild subarticular stenosis bilaterally at L3-4. 5. Facet hypertrophy at L2-3 and L5-S1 without significant stenosis at these levels. ____________________________________________   PROCEDURES  Procedure(s) performed: None  Critical Care performed: No  ____________________________________________   INITIAL IMPRESSION / ASSESSMENT AND PLAN / ED COURSE  Pertinent labs & imaging results that were available during my care of the patient were reviewed by me and considered in my medical decision making (see chart for details).  Low back pain with moderate disc protrusion and hypertrophic 6 noted. Some stenosis is also present in the lumbar spine. Patient is referred to her PCP and referral to neurosurgery for follow-up care. Encouraged her to monitor her vital signs and blood pressure carefully.  Clinical Course  Extremely hypertensive. Clonidine 0.1 mg by mouth given while in the ED. CT without contrast ordered for lumbar spine.  ____________________________________________   FINAL CLINICAL IMPRESSION(S) / ED DIAGNOSES  Final diagnoses:  Bilateral low back pain with right-sided sciatica  Essential hypertension     This chart was dictated using voice recognition software/Dragon. Despite best efforts to proofread, errors can occur which can change the meaning. Any change was purely unintentional.    Evangeline Dakin, PA-C 06/30/16 1101    Jene Every, MD 06/30/16 [id number]

## 2016-06-30 NOTE — ED Triage Notes (Signed)
States she has been having some lower back pain for about 1 week now pain is going into right leg/hip  Ambulates well to treatment room  Denies any recent injury

## 2016-07-20 ENCOUNTER — Encounter: Payer: Self-pay | Admitting: Emergency Medicine

## 2016-07-20 ENCOUNTER — Emergency Department: Payer: BLUE CROSS/BLUE SHIELD

## 2016-07-20 ENCOUNTER — Emergency Department
Admission: EM | Admit: 2016-07-20 | Discharge: 2016-07-20 | Disposition: A | Discharge status: Home / Self Care | Payer: BLUE CROSS/BLUE SHIELD | Attending: Emergency Medicine | Admitting: Emergency Medicine

## 2016-07-20 DIAGNOSIS — Y990 Civilian activity done for income or pay: Secondary | ICD-10-CM | POA: Diagnosis not present

## 2016-07-20 DIAGNOSIS — I10 Essential (primary) hypertension: Secondary | ICD-10-CM | POA: Insufficient documentation

## 2016-07-20 DIAGNOSIS — E119 Type 2 diabetes mellitus without complications: Secondary | ICD-10-CM | POA: Insufficient documentation

## 2016-07-20 DIAGNOSIS — S39012A Strain of muscle, fascia and tendon of lower back, initial encounter: Secondary | ICD-10-CM | POA: Insufficient documentation

## 2016-07-20 DIAGNOSIS — Z79899 Other long term (current) drug therapy: Secondary | ICD-10-CM | POA: Diagnosis not present

## 2016-07-20 DIAGNOSIS — Y9389 Activity, other specified: Secondary | ICD-10-CM | POA: Insufficient documentation

## 2016-07-20 DIAGNOSIS — Z7984 Long term (current) use of oral hypoglycemic drugs: Secondary | ICD-10-CM | POA: Insufficient documentation

## 2016-07-20 DIAGNOSIS — M5431 Sciatica, right side: Secondary | ICD-10-CM

## 2016-07-20 DIAGNOSIS — X58XXXA Exposure to other specified factors, initial encounter: Secondary | ICD-10-CM | POA: Diagnosis not present

## 2016-07-20 DIAGNOSIS — S3992XA Unspecified injury of lower back, initial encounter: Secondary | ICD-10-CM | POA: Diagnosis present

## 2016-07-20 DIAGNOSIS — Y929 Unspecified place or not applicable: Secondary | ICD-10-CM | POA: Insufficient documentation

## 2016-07-20 LAB — URINALYSIS COMPLETE WITH MICROSCOPIC (ARMC ONLY)
Bilirubin Urine: NEGATIVE
Glucose, UA: 150 mg/dL — AB
Hgb urine dipstick: NEGATIVE
Ketones, ur: NEGATIVE mg/dL
Leukocytes, UA: NEGATIVE
Nitrite: NEGATIVE
Protein, ur: 30 mg/dL — AB
Specific Gravity, Urine: 1.017 (ref 1.005–1.030)
pH: 7 (ref 5.0–8.0)

## 2016-07-20 LAB — POCT PREGNANCY, URINE: Preg Test, Ur: NEGATIVE

## 2016-07-20 MED ORDER — ETODOLAC 400 MG PO TABS: 400.0000 mg | ORAL_TABLET | Freq: Two times a day (BID) | ORAL | 0 refills | Status: DC

## 2016-07-20 NOTE — ED Provider Notes (Signed)
[facility] Emergency Department Provider Note  ____________________________________________   None    (approximate)  I have reviewed the triage vital signs and the nursing notes.   HISTORY  Chief Complaint Back Pain    HPI [name] is a 50 y.o. female complaining of low back pain. Patient states that she was [facility] for an injury that occurred at work in July. Patient states that this was denied as Workmen's Comp. Patient states she continues to have pain and now that it is radiating down her leg. 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 She is continued with back pain since that time. She did not get x-rays done at her [facility]. She complains of some paresthesias into her right lower leg that radiates from her buttocks down. Patient denies any incontinence of bowel or bladder. Patient continues to ambulate without assistance. Currently she rates her pain as a 10 over 10.  Patient's blood pressure and triage was elevated and patient states that she has taken her blood pressure medication. She denies any headache, shortness of breath, chest pain.   Past Medical History:  Diagnosis Date  . Diabetes mellitus without complication (HCC)   . Hypertension     There are no active problems to display for this patient.   History reviewed. No pertinent surgical history.  Prior to Admission medications   Medication Sig Start Date End Date Taking? Authorizing Provider  etodolac (LODINE) 400 MG tablet Take 1 tablet (400 mg total) by mouth 2 (two) times daily. 07/20/16   Tommi Rumpshonda L Jonel Weldon, PA-C  Ferrous Sulfate (IRON) 325 (65 FE) MG TABS Take 1 tablet by mouth daily. 09/29/15   Evon Slackhomas C Gaines, PA-C  hydrochlorothiazide (MICROZIDE) 12.5 MG capsule Take 1 capsule (12.5 mg total) by mouth daily. 09/29/15   Evon Slackhomas C Gaines, PA-C  lisinopril (PRINIVIL,ZESTRIL) 5 MG  tablet Take 1 tablet (5 mg total) by mouth daily. 09/29/15   Evon Slackhomas C Gaines, PA-C  metFORMIN (GLUCOPHAGE) 500 MG tablet Take 500 mg by mouth 2 (two) times daily with a meal.    Historical Provider, MD  naproxen (NAPROSYN) 500 MG tablet Take 1 tablet (500 mg total) by mouth 2 (two) times daily with a meal. 06/30/16   Charmayne Sheerharles M Beers, PA-C  oxyCODONE-acetaminophen (ROXICET) 5-325 MG tablet Take 1-2 tablets by mouth every 4 (four) hours as needed for severe pain. 06/30/16   Evangeline Dakinharles M Beers, PA-C    Allergies Review of patient's allergies indicates no known allergies.  No family history on file.  Social History Social History  Substance Use Topics  . Smoking status: Never Smoker  . Smokeless tobacco: Never Used  . Alcohol use No    Review of Systems Constitutional: No fever/chills Cardiovascular: Denies chest pain. Respiratory: Denies shortness of breath. Gastrointestinal: No abdominal pain.  No nausea, no vomiting.  Genitourinary: Negative for dysuria. Musculoskeletal: Positive for low back pain. Skin: Negative for rash.Positive paresthesia right leg. Neurological: Negative for headaches, focal weakness or numbness.  10-point ROS otherwise negative.  ____________________________________________   PHYSICAL EXAM:  VITAL SIGNS: ED Triage Vitals  Enc Vitals Group     BP 07/20/16 1102 (!) 239/109     Pulse Rate 07/20/16 1102 78     Resp 07/20/16 1102 18     Temp 07/20/16 1102 98.4 F (36.9 C)     Temp Source 07/20/16 1102 Oral  SpO2 07/20/16 1102 100 %     Weight 07/20/16 1103 250 lb (113.4 kg)     Height 07/20/16 1103 5\' 6"  (1.676 m)     Head Circumference --      Peak Flow --      Pain Score 07/20/16 1103 10     Pain Loc --      Pain Edu? --      Excl. in GC? --     Constitutional: Alert and oriented. Well appearing and in no acute distress. Eyes: Conjunctivae are normal. PERRL. EOMI. Head: Atraumatic. Nose: No congestion/rhinnorhea. Mouth/Throat: Mucous  membranes are moist.  Oropharynx non-erythematous. Neck: No stridor.   Cardiovascular: Normal rate, regular rhythm. Grossly normal heart sounds.  Good peripheral circulation. Respiratory: Normal respiratory effort.  No retractions. Lungs CTAB. Gastrointestinal: Soft and nontender. No distention.  No CVA tenderness. Musculoskeletal: On examination back there is no gross deformity. Range of motion is slightly restricted secondary to discomfort. There is tenderness on palpation of the right SI joint area and buttocks. There is also tenderness on palpation of L5-S1 area and paravertebral muscles mainly on the right. No active muscle spasms are seen. Straight leg raises were approximately 60 with discomfort bilaterally. Neurologic:  Normal speech and language. No gross focal neurologic deficits are appreciated. No gait instability. Reflexes were 1+ bilaterally. Skin:  Skin is warm, dry and intact. No rash noted. Psychiatric: Mood and affect are normal. Speech and behavior are normal.  ____________________________________________   LABS (all labs ordered are listed, but only abnormal results are displayed)  Labs Reviewed  URINALYSIS COMPLETEWITH MICROSCOPIC ([facility] ONLY) - Abnormal; Notable for the following:       Result Value   Color, Urine YELLOW (*)    APPearance CLEAR (*)    Glucose, UA 150 (*)    Protein, ur 30 (*)    Bacteria, UA RARE (*)    Squamous Epithelial / LPF 0-5 (*)    All other components within normal limits  POC URINE PREG, ED  POCT PREGNANCY, URINE     RADIOLOGY   Spine x-ray per radiologist IMPRESSION:  Areas of osteoarthritic change, most notably at L4-5 but also with  mild disc space narrowing at L3-4. Previously noted mild  spondylolisthesis at L4-5 is not appreciable currently. On the  current examination, no fracture or spondylolisthesis is  appreciable.    ____________________________________________   PROCEDURES  Procedure(s) performed:  None  Procedures  Critical Care performed: No  ____________________________________________   INITIAL IMPRESSION / ASSESSMENT AND PLAN / ED COURSE  Pertinent labs & imaging results that were available during my care of the patient were reviewed by me and considered in my medical decision making (see chart for details).    Clinical Course   Patient but was rechecked twice prior to her discharge. Patient is to follow-up with her primary care doctor this week about her blood pressure medication and was encouraged to continue taking her blood pressure medication as directed. Her lowest blood pressure was 199/80. She denies any chest pain, shortness of breath, headache or dizziness. Patient was given a prescription for etodolac 400 mg twice a day with food. She is to follow-up with Dr. Rosita Kea for her continued back pain.  ____________________________________________   FINAL CLINICAL IMPRESSION(S) / ED DIAGNOSES  Final diagnoses:  Low back strain, initial encounter  Sciatica of right side      NEW MEDICATIONS STARTED DURING THIS VISIT:  Discharge Medication List as of 07/20/2016  2:07 PM  START taking these medications   Details  etodolac (LODINE) 400 MG tablet Take 1 tablet (400 mg total) by mouth 2 (two) times daily., Starting Sat 07/20/2016, Print         Note:  This document was prepared using Dragon voice recognition software and may include unintentional dictation errors.    Tommi Rumpshonda L Tierria Watson, PA-C 07/20/16 1423    Tommi Rumpshonda L Ashani Pumphrey, PA-C 07/20/16 1424    Minna AntisKevin Paduchowski, MD 07/20/16 1500

## 2016-07-20 NOTE — Discharge Instructions (Signed)
Begin etodolac 400 mg twice a day with food. Follow-up with your primary care doctor about your blood pressure. Follow up with Dr. Rosita KeaMenz if any continued problems with your back.

## 2016-07-20 NOTE — ED Notes (Signed)

## 2017-03-01 ENCOUNTER — Emergency Department: Payer: BLUE CROSS/BLUE SHIELD

## 2017-03-01 ENCOUNTER — Emergency Department
Admission: EM | Admit: 2017-03-01 | Discharge: 2017-03-02 | Disposition: A | Discharge status: Home / Self Care | Payer: BLUE CROSS/BLUE SHIELD | Attending: Emergency Medicine | Admitting: Emergency Medicine

## 2017-03-01 DIAGNOSIS — I10 Essential (primary) hypertension: Secondary | ICD-10-CM | POA: Insufficient documentation

## 2017-03-01 DIAGNOSIS — Z7984 Long term (current) use of oral hypoglycemic drugs: Secondary | ICD-10-CM | POA: Insufficient documentation

## 2017-03-01 DIAGNOSIS — R0602 Shortness of breath: Secondary | ICD-10-CM | POA: Insufficient documentation

## 2017-03-01 DIAGNOSIS — Z79899 Other long term (current) drug therapy: Secondary | ICD-10-CM | POA: Insufficient documentation

## 2017-03-01 DIAGNOSIS — E119 Type 2 diabetes mellitus without complications: Secondary | ICD-10-CM | POA: Insufficient documentation

## 2017-03-01 LAB — COMPREHENSIVE METABOLIC PANEL
ALT: 20 U/L (ref 14–54)
AST: 25 U/L (ref 15–41)
Albumin: 4 g/dL (ref 3.5–5.0)
Alkaline Phosphatase: 61 U/L (ref 38–126)
Anion gap: 8 (ref 5–15)
BUN: 8 mg/dL (ref 6–20)
CO2: 26 mmol/L (ref 22–32)
Calcium: 9.6 mg/dL (ref 8.9–10.3)
Chloride: 104 mmol/L (ref 101–111)
Creatinine, Ser: 0.64 mg/dL (ref 0.44–1.00)
GFR calc Af Amer: >60 mL/min (ref >60)
GFR calc non Af Amer: >60 mL/min (ref >60)
Glucose, Bld: 116 mg/dL — ABNORMAL HIGH (ref 65–99)
Potassium: 3.5 mmol/L (ref 3.5–5.1)
Sodium: 138 mmol/L (ref 135–145)
Total Bilirubin: 0.9 mg/dL (ref 0.3–1.2)
Total Protein: 8.8 g/dL — ABNORMAL HIGH (ref 6.5–8.1)

## 2017-03-01 LAB — CBC
HCT: 31 % — ABNORMAL LOW (ref 35.0–47.0)
Hemoglobin: 9.4 g/dL — ABNORMAL LOW (ref 12.0–16.0)
MCH: 20.5 pg — ABNORMAL LOW (ref 26.0–34.0)
MCHC: 30.2 g/dL — ABNORMAL LOW (ref 32.0–36.0)
MCV: 67.8 fL — ABNORMAL LOW (ref 80.0–100.0)
Platelets: 219 10*3/uL (ref 150–440)
RBC: 4.57 MIL/uL (ref 3.80–5.20)
RDW: 21.7 % — ABNORMAL HIGH (ref 11.5–14.5)
WBC: 8 10*3/uL (ref 3.6–11.0)

## 2017-03-01 LAB — BRAIN NATRIURETIC PEPTIDE: B Natriuretic Peptide: 263 pg/mL — ABNORMAL HIGH (ref 0.0–100.0)

## 2017-03-01 LAB — TROPONIN I: Troponin I: 0.05 ng/mL (ref <0.03)

## 2017-03-01 MED ORDER — LISINOPRIL 10 MG PO TABS: 10.0000 mg | ORAL_TABLET | Freq: Every day | ORAL | 1 refills | Status: DC

## 2017-03-01 MED ORDER — HYDROCHLOROTHIAZIDE 25 MG PO TABS: 25.0000 mg | ORAL_TABLET | Freq: Every day | ORAL | Status: DC

## 2017-03-01 MED ORDER — HYDRALAZINE HCL 20 MG/ML IJ SOLN
5.0000 mg | Freq: Once | INTRAMUSCULAR | Status: AC
  Administered 2017-03-01: 5 mg via INTRAVENOUS
  Filled 2017-03-01: qty 1

## 2017-03-01 MED ORDER — HYDROCHLOROTHIAZIDE 25 MG PO TABS: 25.0000 mg | ORAL_TABLET | Freq: Every day | ORAL | 1 refills | Status: DC

## 2017-03-01 MED ORDER — ALBUTEROL SULFATE (2.5 MG/3ML) 0.083% IN NEBU
5.0000 mg | INHALATION_SOLUTION | Freq: Once | RESPIRATORY_TRACT | Status: AC
  Administered 2017-03-01: 5 mg via RESPIRATORY_TRACT
  Filled 2017-03-01: qty 6

## 2017-03-01 MED ORDER — LISINOPRIL 10 MG PO TABS
10.0000 mg | ORAL_TABLET | Freq: Once | ORAL | Status: AC
Start: 2017-03-01 — End: 2017-03-02
  Administered 2017-03-02: 10 mg via ORAL
  Filled 2017-03-01: qty 1

## 2017-03-01 NOTE — ED Triage Notes (Signed)
Patient reports being out of medication blood pressure and fluid pills for approximately 1 year.  Patient reports shortness of breath and swelling of lower extremities.

## 2017-03-01 NOTE — ED Provider Notes (Signed)
[facility] Emergency Department Provider Note   ____________________________________________    I have reviewed the triage vital signs and the nursing notes.   HISTORY  Chief Complaint Shortness of Breath     HPI [name] is a 51 y.o. female with a history of hypertension and diabetes who presents with complaints of high blood pressure and being out of her medication. Patient 4 she is never blood pressure medication for over a year. She reports feeling slightly short of breath and is having swelling in her lower extremities. She denies chest pain. No fevers or chills. No cough. No recent travel.   Past Medical History:  Diagnosis Date  . Diabetes mellitus without complication (HCC)   . Hypertension     There are no active problems to display for this patient.   No past surgical history on file.  Prior to Admission medications   Medication Sig Start Date End Date Taking? Authorizing Provider  etodolac (LODINE) 400 MG tablet Take 1 tablet (400 mg total) by mouth 2 (two) times daily. 07/20/16   Tommi Rumps, PA-C  Ferrous Sulfate (IRON) 325 (65 FE) MG TABS Take 1 tablet by mouth daily. 09/29/15   Evon Slack, PA-C  hydrochlorothiazide (HYDRODIURIL) 25 MG tablet Take 1 tablet (25 mg total) by mouth daily. 03/01/17   Jene Every, MD  lisinopril (PRINIVIL,ZESTRIL) 10 MG tablet Take 1 tablet (10 mg total) by mouth daily. 03/01/17 03/01/18  Jene Every, MD  metFORMIN (GLUCOPHAGE) 500 MG tablet Take 500 mg by mouth 2 (two) times daily with a meal.    Historical Provider, MD  naproxen (NAPROSYN) 500 MG tablet Take 1 tablet (500 mg total) by mouth 2 (two) times daily with a meal. 06/30/16   Charmayne Sheer Beers, PA-C  oxyCODONE-acetaminophen (ROXICET) 5-325 MG tablet Take 1-2 tablets by mouth every 4 (four) hours as needed for severe pain. 06/30/16   Evangeline Dakin, PA-C     Allergies Patient has no known allergies.  No family history on  file.  Social History Social History  Substance Use Topics  . Smoking status: Never Smoker  . Smokeless tobacco: Never Used  . Alcohol use No    Review of Systems  Constitutional: No Dizziness Eyes: No visual changes.  .Cardiovascular: Denies chest pain. Respiratory: Mild shortness of breath Gastrointestinal: No abdominal pain.  No nausea, no vomiting.    Musculoskeletal: Negative for back pain. Skin: Negative for rash. Neurological: Negative for headaches or weakness  10-point ROS otherwise negative.  ____________________________________________   PHYSICAL EXAM:  VITAL SIGNS: ED Triage Vitals  Enc Vitals Group     BP 03/01/17 2010 (!) 261/119     Pulse Rate 03/01/17 2010 78     Resp 03/01/17 2010 (!) 22     Temp 03/01/17 2010 98.6 F (37 C)     Temp Source 03/01/17 2010 Oral     SpO2 03/01/17 2010 99 %     Weight 03/01/17 2005 280 lb (127 kg)     Height 03/01/17 2005  (1.676 m)     Head Circumference --      Peak Flow --      Pain Score 03/01/17 2004 3     Pain Loc --      Pain Edu? --      Excl. in GC? --     Constitutional: Alert and oriented. No acute distress. Pleasant and interactive Eyes: Conjunctivae are normal.  . Nose: No congestion/rhinnorhea.  Mouth/Throat: Mucous membranes are moist.    Cardiovascular: Normal rate, regular rhythm. Grossly normal heart sounds.  Good peripheral circulation. Respiratory: Normal respiratory effort.  No retractions. Lungs CTAB. Gastrointestinal: Soft and nontender. No distention.  No CVA tenderness. Genitourinary: deferred Musculoskeletal: Mild bilateral lower extremity edema.  Warm and well perfused Neurologic:  Normal speech and language. No gross focal neurologic deficits are appreciated.  Skin:  Skin is warm, dry and intact. No rash noted. Psychiatric: Mood and affect are normal. Speech and behavior are normal.  ____________________________________________   LABS (all labs ordered are listed, but only  abnormal results are displayed)  Labs Reviewed  CBC - Abnormal; Notable for the following:       Result Value   Hemoglobin 9.4 (*)    HCT 31.0 (*)    MCV 67.8 (*)    MCH 20.5 (*)    MCHC 30.2 (*)    RDW 21.7 (*)    All other components within normal limits  COMPREHENSIVE METABOLIC PANEL - Abnormal; Notable for the following:    Glucose, Bld 116 (*)    Total Protein 8.8 (*)    All other components within normal limits  BRAIN NATRIURETIC PEPTIDE - Abnormal; Notable for the following:    B Natriuretic Peptide 263.0 (*)    All other components within normal limits  TROPONIN I - Abnormal; Notable for the following:    Troponin I 0.05 (*)    All other components within normal limits   ____________________________________________  EKG  ED ECG REPORT I, Jene Every, the attending physician, personally viewed and interpreted this ECG.  Date: 03/01/2017 EKG Time: 8:09 PM Rate: 78 Rhythm: normal sinus rhythm QRS Axis: Left axis deviation Intervals: normal ST/T Wave abnormalities: normal Conduction Disturbances: Incomplete right bundle branch block   ____________________________________________  RADIOLOGY  Chest x-ray unchanged from prior ____________________________________________   PROCEDURES  Procedure(s) performed: No    Critical Care performed: No ____________________________________________   INITIAL IMPRESSION / ASSESSMENT AND PLAN / ED COURSE  Pertinent labs & imaging results that were available during my care of the patient were reviewed by me and considered in my medical decision making (see chart for details).  Patient presents with markedly elevated blood pressure and mild shortness of breath. She does have lower extremity edema as well, concern for CHF. We will check labs, chest x-ray, EKG and reevaluate   Patient with elevated troponin but this is typical for her. EKG unchanged from prior. Blood pressure is trending down on its own, we will give  IV hydralazine and restart her home medications. If blood pressure improved suspect she will be appropriate for discharge with outpatient follow-up  I have asked Dr. Derrill Kay to monitor blood pressure and disposition as appropriate    ____________________________________________   FINAL CLINICAL IMPRESSION(S) / ED DIAGNOSES  Final diagnoses:  Essential hypertension      NEW MEDICATIONS STARTED DURING THIS VISIT:  New Prescriptions   HYDROCHLOROTHIAZIDE (HYDRODIURIL) 25 MG TABLET    Take 1 tablet (25 mg total) by mouth daily.   LISINOPRIL (PRINIVIL,ZESTRIL) 10 MG TABLET    Take 1 tablet (10 mg total) by mouth daily.     Note:  This document was prepared using Dragon voice recognition software and may include unintentional dictation errors.    Jene Every, MD 03/01/17 [phone]

## 2017-03-01 NOTE — ED Notes (Signed)
Pt. States her shortness of breath started a couple days ago.  Pt. States she is going through some finaicial stressors and has not been able to take her blood pressure medication for over a year.

## 2017-03-01 NOTE — ED Notes (Signed)
Rn Matt notified of pt in room 1

## 2017-03-01 NOTE — ED Notes (Signed)
Lab called to report elevated troponin of 0.05, Dr. Cyril Loosen informed.

## 2017-03-02 MED ORDER — HYDROCHLOROTHIAZIDE 25 MG PO TABS: 25.0000 mg | ORAL_TABLET | Freq: Once | ORAL | Status: DC

## 2017-03-02 NOTE — ED Notes (Signed)
Pt. Going home by self.  Pt. Given discharge instructions and additional information on obtaining medications.

## 2017-03-02 NOTE — Discharge Instructions (Signed)
Please seek medical attention for any high fevers, chest pain, shortness of breath, change in behavior, persistent vomiting, bloody stool or any other new or concerning symptoms.  

## 2017-07-30 IMAGING — CR DG CHEST 2V
1 series · 2 of 2 positions shown · non-contrast
Comparison: 09/29/2015

CLINICAL DATA: Cough, congestion

EXAM:
CHEST  2 VIEW

[Series 1: dg chest 2 view · 0.14mm/px · 2 of 2 slices shown]
[im 1/2]
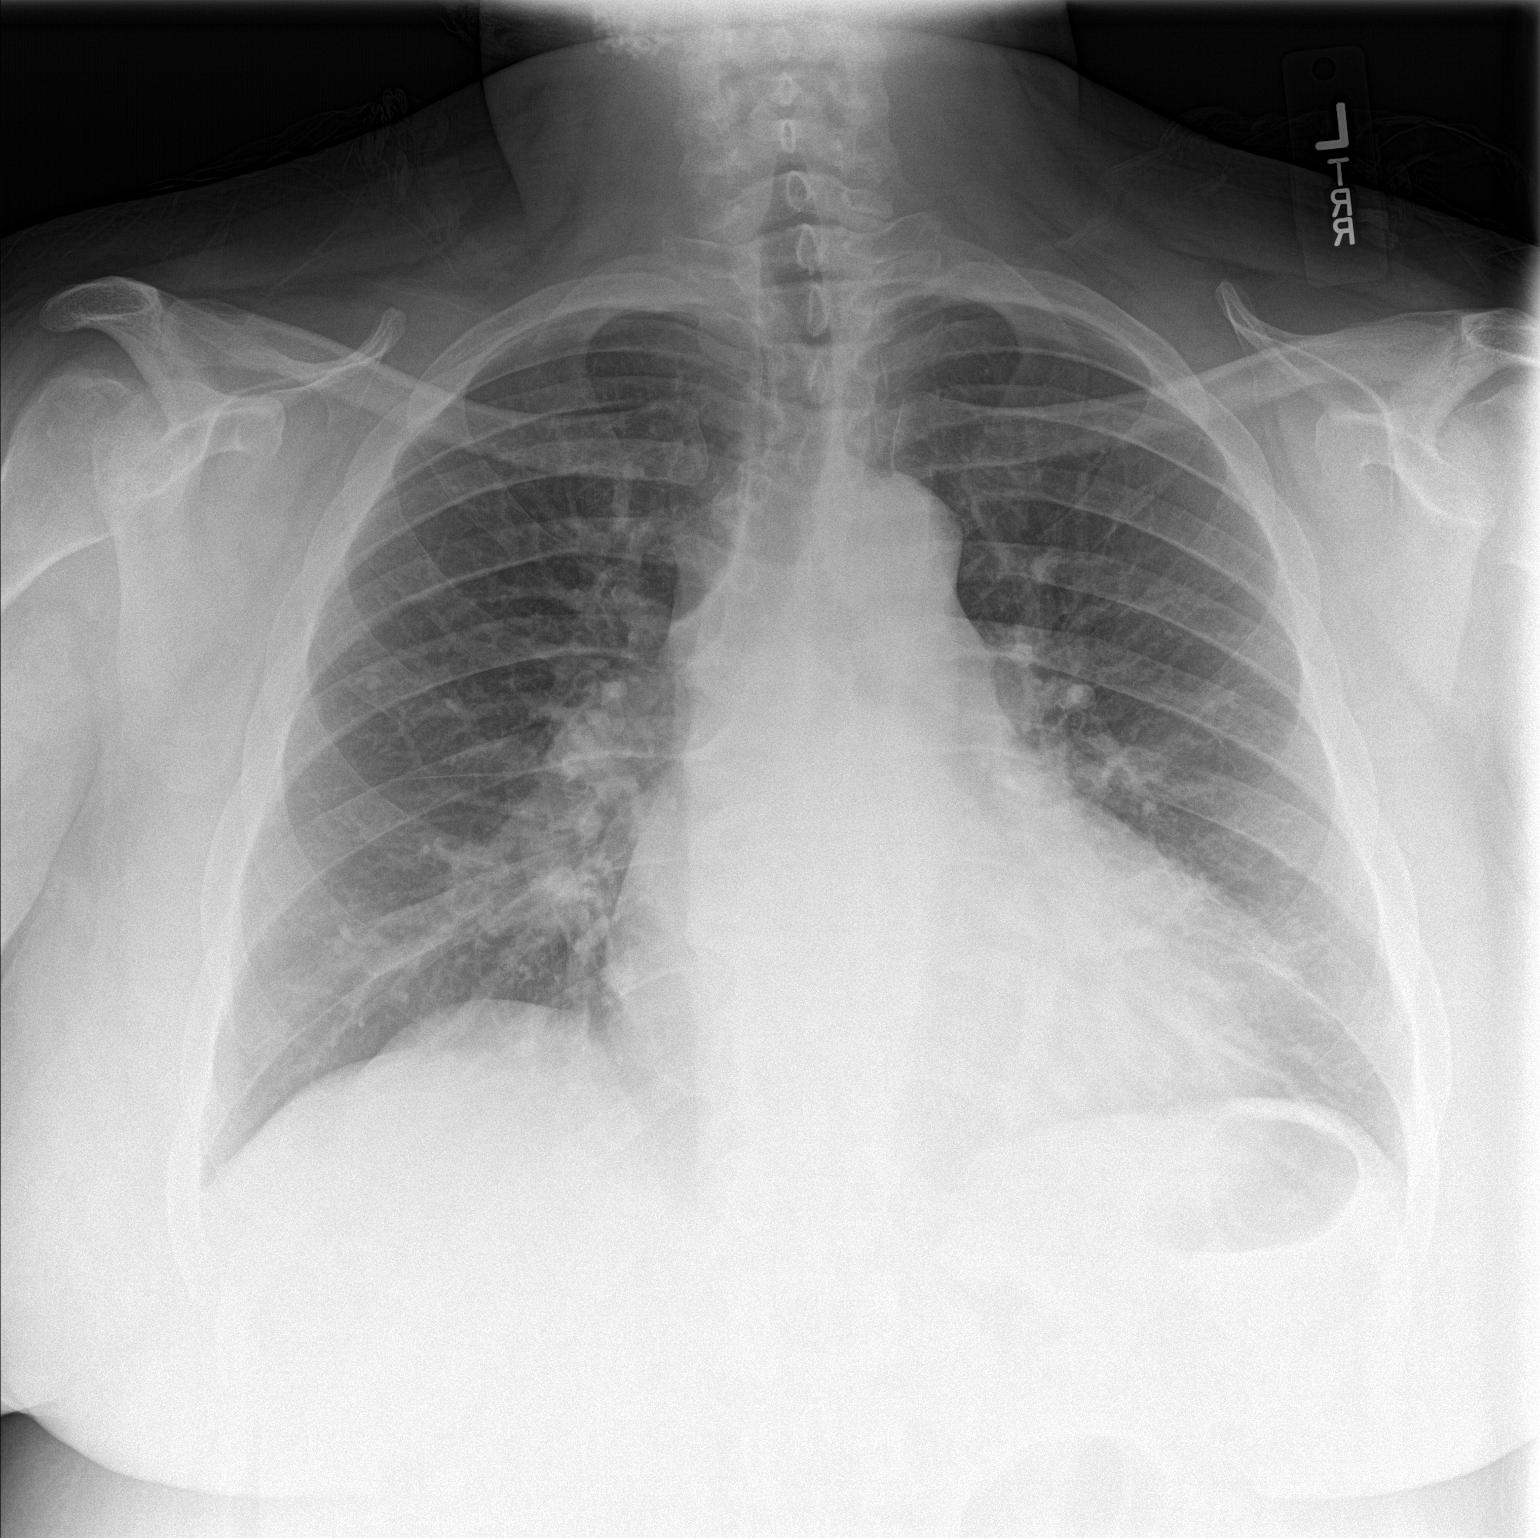
[im 2/2]
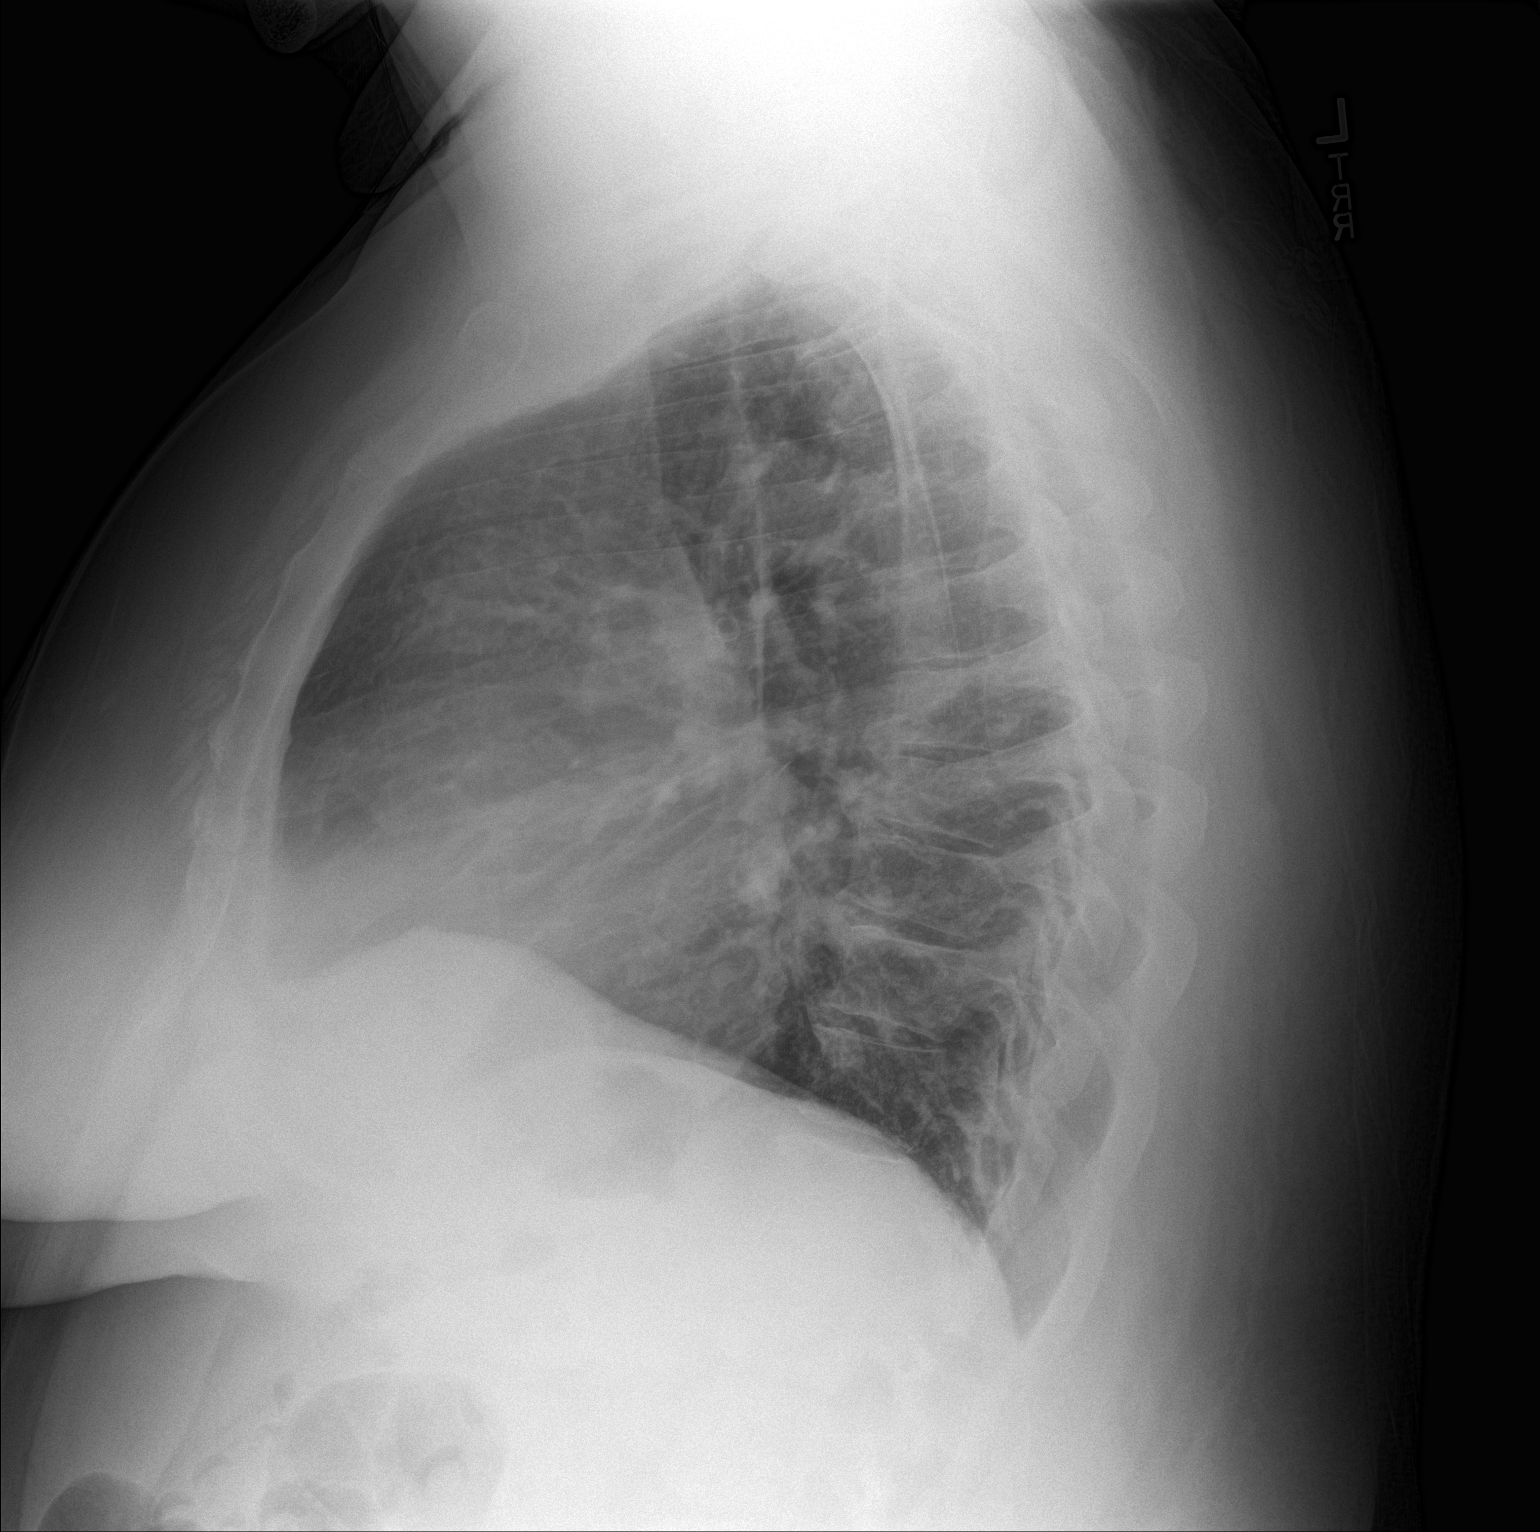

[2 of 2 positions shown; findings below may reference images not displayed]

FINDINGS: Lungs are clear.  No pleural effusion or pneumothorax.

The heart is top-normal in size.

Degenerative changes of the visualized thoracolumbar spine.
IMPRESSION: No evidence of acute cardiopulmonary disease.

## 2019-11-13 ENCOUNTER — Emergency Department: Payer: Self-pay

## 2019-11-13 ENCOUNTER — Other Ambulatory Visit: Payer: Self-pay

## 2019-11-13 ENCOUNTER — Observation Stay (HOSPITAL_BASED_OUTPATIENT_CLINIC_OR_DEPARTMENT_OTHER)
Admit: 2019-11-13 | Discharge: 2019-11-13 | Disposition: A | Discharge status: Home / Self Care | Payer: Self-pay | Attending: Internal Medicine | Admitting: Internal Medicine

## 2019-11-13 ENCOUNTER — Encounter: Payer: Self-pay | Admitting: Emergency Medicine

## 2019-11-13 ENCOUNTER — Inpatient Hospital Stay
Admission: EM | Admit: 2019-11-13 | Discharge: 2019-11-15 | DRG: 304 | Disposition: A | Discharge status: Home / Self Care | Payer: Self-pay | Attending: Internal Medicine | Admitting: Internal Medicine

## 2019-11-13 DIAGNOSIS — I110 Hypertensive heart disease with heart failure: Secondary | ICD-10-CM | POA: Diagnosis present

## 2019-11-13 DIAGNOSIS — E119 Type 2 diabetes mellitus without complications: Secondary | ICD-10-CM

## 2019-11-13 DIAGNOSIS — I161 Hypertensive emergency: Principal | ICD-10-CM | POA: Diagnosis present

## 2019-11-13 DIAGNOSIS — Z20828 Contact with and (suspected) exposure to other viral communicable diseases: Secondary | ICD-10-CM | POA: Diagnosis present

## 2019-11-13 DIAGNOSIS — E66813 Obesity, class 3: Secondary | ICD-10-CM

## 2019-11-13 DIAGNOSIS — Z79899 Other long term (current) drug therapy: Secondary | ICD-10-CM

## 2019-11-13 DIAGNOSIS — I160 Hypertensive urgency: Secondary | ICD-10-CM

## 2019-11-13 DIAGNOSIS — I509 Heart failure, unspecified: Secondary | ICD-10-CM

## 2019-11-13 DIAGNOSIS — Z6841 Body Mass Index (BMI) 40.0 and over, adult: Secondary | ICD-10-CM

## 2019-11-13 DIAGNOSIS — J810 Acute pulmonary edema: Secondary | ICD-10-CM

## 2019-11-13 DIAGNOSIS — I5031 Acute diastolic (congestive) heart failure: Secondary | ICD-10-CM

## 2019-11-13 DIAGNOSIS — Z7984 Long term (current) use of oral hypoglycemic drugs: Secondary | ICD-10-CM

## 2019-11-13 DIAGNOSIS — E1165 Type 2 diabetes mellitus with hyperglycemia: Secondary | ICD-10-CM | POA: Diagnosis present

## 2019-11-13 LAB — BASIC METABOLIC PANEL
Anion gap: 10 (ref 5–15)
BUN: 18 mg/dL (ref 6–20)
CO2: 23 mmol/L (ref 22–32)
Calcium: 9.4 mg/dL (ref 8.9–10.3)
Chloride: 105 mmol/L (ref 98–111)
Creatinine, Ser: 0.8 mg/dL (ref 0.44–1.00)
GFR calc Af Amer: >60 mL/min (ref >60)
GFR calc non Af Amer: >60 mL/min (ref >60)
Glucose, Bld: 183 mg/dL — ABNORMAL HIGH (ref 70–99)
Potassium: 3.8 mmol/L (ref 3.5–5.1)
Sodium: 138 mmol/L (ref 135–145)

## 2019-11-13 LAB — GLUCOSE, CAPILLARY
Glucose-Capillary: 168 mg/dL — ABNORMAL HIGH (ref 70–99)
Glucose-Capillary: 204 mg/dL — ABNORMAL HIGH (ref 70–99)
Glucose-Capillary: 176 mg/dL — ABNORMAL HIGH (ref 70–99)

## 2019-11-13 LAB — ECHOCARDIOGRAM COMPLETE
Height: 66 in
Weight: 4608 oz

## 2019-11-13 LAB — CBC
HCT: 33.6 % — ABNORMAL LOW (ref 36.0–46.0)
Hemoglobin: 9.5 g/dL — ABNORMAL LOW (ref 12.0–15.0)
MCH: 21.2 pg — ABNORMAL LOW (ref 26.0–34.0)
MCHC: 28.3 g/dL — ABNORMAL LOW (ref 30.0–36.0)
MCV: 74.8 fL — ABNORMAL LOW (ref 80.0–100.0)
Platelets: 290 10*3/uL (ref 150–400)
RBC: 4.49 MIL/uL (ref 3.87–5.11)
RDW: 20.7 % — ABNORMAL HIGH (ref 11.5–15.5)
WBC: 5.9 10*3/uL (ref 4.0–10.5)
nRBC: 0 % (ref 0.0–0.2)

## 2019-11-13 LAB — SARS CORONAVIRUS 2 (TAT 6-24 HRS): SARS Coronavirus 2: NEGATIVE

## 2019-11-13 LAB — BRAIN NATRIURETIC PEPTIDE: B Natriuretic Peptide: 823 pg/mL — ABNORMAL HIGH (ref 0.0–100.0)

## 2019-11-13 LAB — TSH: TSH: 1.163 u[IU]/mL (ref 0.350–4.500)

## 2019-11-13 LAB — TROPONIN I (HIGH SENSITIVITY)
Troponin I (High Sensitivity): 102 ng/L (ref <18)
Troponin I (High Sensitivity): 92 ng/L — ABNORMAL HIGH (ref <18)

## 2019-11-13 LAB — HIV ANTIBODY (ROUTINE TESTING W REFLEX): HIV Screen 4th Generation wRfx: NONREACTIVE

## 2019-11-13 MED ORDER — CLONIDINE HCL 0.1 MG PO TABS
0.2000 mg | ORAL_TABLET | Freq: Once | ORAL | Status: AC
  Administered 2019-11-13: 09:00:00 0.2 mg via ORAL
  Filled 2019-11-13: qty 2

## 2019-11-13 MED ORDER — INSULIN ASPART 100 UNIT/ML ~~LOC~~ SOLN
0.0000 [IU] | Freq: Three times a day (TID) | SUBCUTANEOUS | Status: DC
  Administered 2019-11-13: 3 [IU] via SUBCUTANEOUS
  Administered 2019-11-13 – 2019-11-14 (×2): 2 [IU] via SUBCUTANEOUS
  Administered 2019-11-14: 08:00:00 1 [IU] via SUBCUTANEOUS
  Administered 2019-11-14 – 2019-11-15 (×3): 2 [IU] via SUBCUTANEOUS
  Filled 2019-11-13 (×6): qty 1

## 2019-11-13 MED ORDER — INSULIN ASPART 100 UNIT/ML ~~LOC~~ SOLN: 0.0000 [IU] | Freq: Every day | SUBCUTANEOUS | Status: DC

## 2019-11-13 MED ORDER — ENOXAPARIN SODIUM 40 MG/0.4ML ~~LOC~~ SOLN
40.0000 mg | Freq: Two times a day (BID) | SUBCUTANEOUS | Status: DC
  Administered 2019-11-13 – 2019-11-15 (×4): 40 mg via SUBCUTANEOUS
  Filled 2019-11-13 (×6): qty 0.4

## 2019-11-13 MED ORDER — HYDRALAZINE HCL 20 MG/ML IJ SOLN: 10.0000 mg | INTRAMUSCULAR | Status: DC | PRN

## 2019-11-13 MED ORDER — PERFLUTREN LIPID MICROSPHERE
1.0000 mL | INTRAVENOUS | Status: AC | PRN
  Administered 2019-11-13: 4 mL via INTRAVENOUS
  Filled 2019-11-13: qty 10

## 2019-11-13 MED ORDER — ACETAMINOPHEN 650 MG RE SUPP: 650.0000 mg | Freq: Four times a day (QID) | RECTAL | Status: DC | PRN

## 2019-11-13 MED ORDER — INFLUENZA VAC SPLIT QUAD 0.5 ML IM SUSY: 0.5000 mL | PREFILLED_SYRINGE | INTRAMUSCULAR | Status: DC

## 2019-11-13 MED ORDER — FUROSEMIDE 10 MG/ML IJ SOLN
40.0000 mg | Freq: Once | INTRAMUSCULAR | Status: AC
  Administered 2019-11-13: 40 mg via INTRAVENOUS
  Filled 2019-11-13: qty 4

## 2019-11-13 MED ORDER — HYDRALAZINE HCL 20 MG/ML IJ SOLN
10.0000 mg | INTRAMUSCULAR | Status: DC | PRN
  Administered 2019-11-13 – 2019-11-14 (×3): 10 mg via INTRAVENOUS
  Filled 2019-11-13: qty 1
  Filled 2019-11-13: qty 0.5
  Filled 2019-11-13 (×3): qty 1

## 2019-11-13 MED ORDER — ONDANSETRON HCL 4 MG/2ML IJ SOLN: 4.0000 mg | Freq: Four times a day (QID) | INTRAMUSCULAR | Status: DC | PRN

## 2019-11-13 MED ORDER — FUROSEMIDE 10 MG/ML IJ SOLN
40.0000 mg | Freq: Two times a day (BID) | INTRAMUSCULAR | Status: DC
  Administered 2019-11-13 – 2019-11-15 (×4): 40 mg via INTRAVENOUS
  Filled 2019-11-13 (×5): qty 4

## 2019-11-13 MED ORDER — ENOXAPARIN SODIUM 40 MG/0.4ML ~~LOC~~ SOLN: 40.0000 mg | SUBCUTANEOUS | Status: DC

## 2019-11-13 MED ORDER — PNEUMOCOCCAL VAC POLYVALENT 25 MCG/0.5ML IJ INJ: 0.5000 mL | INJECTION | INTRAMUSCULAR | Status: DC

## 2019-11-13 MED ORDER — ONDANSETRON HCL 4 MG PO TABS
4.0000 mg | ORAL_TABLET | Freq: Four times a day (QID) | ORAL | Status: DC | PRN
  Filled 2019-11-13: qty 1

## 2019-11-13 MED ORDER — ALBUTEROL SULFATE (2.5 MG/3ML) 0.083% IN NEBU: 5.0000 mg | INHALATION_SOLUTION | Freq: Once | RESPIRATORY_TRACT | Status: DC

## 2019-11-13 MED ORDER — HYDRALAZINE HCL 20 MG/ML IJ SOLN
10.0000 mg | Freq: Once | INTRAMUSCULAR | Status: AC
  Administered 2019-11-13: 10 mg via INTRAVENOUS
  Filled 2019-11-13: qty 1

## 2019-11-13 MED ORDER — ACETAMINOPHEN 325 MG PO TABS
650.0000 mg | ORAL_TABLET | Freq: Four times a day (QID) | ORAL | Status: DC | PRN
  Administered 2019-11-14 – 2019-11-15 (×3): 650 mg via ORAL
  Filled 2019-11-13 (×3): qty 2

## 2019-11-13 MED ORDER — CARVEDILOL 6.25 MG PO TABS
6.2500 mg | ORAL_TABLET | Freq: Two times a day (BID) | ORAL | Status: DC
  Administered 2019-11-13 – 2019-11-15 (×5): 6.25 mg via ORAL
  Filled 2019-11-13 (×6): qty 1

## 2019-11-13 MED ORDER — HYDRALAZINE HCL 20 MG/ML IJ SOLN: 10.0000 mg | Freq: Once | INTRAMUSCULAR | Status: DC

## 2019-11-13 MED ORDER — LISINOPRIL 20 MG PO TABS
20.0000 mg | ORAL_TABLET | Freq: Every day | ORAL | Status: DC
  Administered 2019-11-13 – 2019-11-15 (×3): 20 mg via ORAL
  Filled 2019-11-13: qty 2
  Filled 2019-11-13 (×2): qty 1

## 2019-11-13 NOTE — Progress Notes (Signed)
Anticoagulation monitoring(Lovenox):  53yo  female ordered Lovenox 40 mg Q24h  Filed Weights   11/13/19 0717  Weight: 288 lb (130.6 kg)   Body mass index is 46.48 kg/m.   Lab Results  Component Value Date   CREATININE 0.80 11/13/2019   CREATININE 0.64 03/01/2017   CREATININE 0.73 04/21/2016   Estimated Creatinine Clearance: 112.7 mL/min (by C-G formula based on SCr of 0.8 mg/dL). Hemoglobin & Hematocrit     Component Value Date/Time   HGB 9.5 (L) 11/13/2019 0737   HGB 8.1 (L) 02/19/2014 2247   HCT 33.6 (L) 11/13/2019 0737   HCT 27.2 (L) 02/19/2014 2247     Per Protocol for Patient with estCrcl > 30 ml/min and BMI > 40, will transition to Lovenox 40 mg Q12h.

## 2019-11-13 NOTE — H&P (Addendum)
History and Physical    Ermel Verne JXB:[id number] DOB: Jan 09, 1966 DOA: 11/13/2019  I have briefly reviewed the patient's prior medical records in [facility]  [facility]P: [facility] [place]  Patient coming from: home  Chief Complaint: shortness of breath  HPI: [name] is a 53 y.o. female with medical history significant of poorly controlled hypertension, noted urine to home medications due to financial reasons, diet-controlled diabetes mellitus, presents to the hospital with chief complaint of shortness of breath and lower extremity swelling.  Patient has not been taking her blood pressure medications for the past year due to financial reasons, and notes that her blood pressure is quite high.  Over the last several days patient has been experiencing increasing shortness of breath with ambulation, as well as lower extremity swelling.  She has had episodes of intermittent blurry vision but none currently.  She knows that some of the symptoms that she has been intermittently experiencing are related to high blood pressure.  She denies any chest pain.  She denies any abdominal pain, nausea or vomiting.  She has had no recent fever or chills, no cough or chest congestion.  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  ED Course: In the emergency room patient is afebrile, blood pressure is in the 130Q systolic over 657 diastolic, and she is satting 98% on room air.  Blood work reveals normal kidney function, glucose of 183, BNP is elevated at 823 and high-sensitivity troponin is high at 102.  CBC shows microcytic anemia. A chest x-ray shows cardiomegaly, pulmonary vascular congestion and bilateral pulmonary edema consistent with CHF.  She was given Lasix, hydralazine, and we are asked to admit.  Review of Systems: All systems reviewed, and apart from HPI, all negative  Past Medical History:  Diagnosis  Date  . Diabetes mellitus without complication ([place])   . Hypertension     History reviewed. No pertinent surgical history.   reports that she has never smoked. She has never used smokeless tobacco. She reports that she does not drink alcohol or use drugs.  No Known Allergies  History reviewed. No pertinent family history.  Prior to Admission medications   Medication Sig Start Date End Date Taking? Authorizing Provider  hydrochlorothiazide (HYDRODIURIL) 25 MG tablet Take 1 tablet (25 mg total) by mouth daily. 03/01/17   Lavonia Drafts, MD  lisinopril (PRINIVIL,ZESTRIL) 10 MG tablet Take 1 tablet (10 mg total) by mouth daily. 03/01/17 03/01/18  Lavonia Drafts, MD  metFORMIN (GLUCOPHAGE) 500 MG tablet Take 500 mg by mouth 2 (two) times daily with a meal.    [provider]    Physical Exam: Vitals:   11/13/19 0717 11/13/19 0751 11/13/19 0800 11/13/19 0844  BP:  (S) (!) 268/124 (!) 248/118 (!) 256/120  Pulse:      Resp:      Temp:      TempSrc:      SpO2:      Weight: 130.6 kg     Height: 5\' 6"  (1.676 m)       Constitutional: NAD, calm, comfortable Eyes: PERRL, lids and conjunctivae normal ENMT: Mucous membranes are moist. Neck: normal, supple Respiratory: Mild bibasilar crackles, no wheezing heard.  Normal respiratory effort Cardiovascular: Regular rate and rhythm, no murmurs / rubs / gallops.  1+ lower extremity edema Abdomen: no tenderness, no masses palpated. Bowel sounds positive.  Musculoskeletal: no clubbing / cyanosis. Normal muscle tone.  Skin: no rashes, lesions, ulcers. No induration Neurologic: CN 2-12 grossly intact. Strength 5/5 in all 4.  Psychiatric: Normal judgment and insight. Alert and oriented x 3. Normal mood.   Labs on Admission: I have personally reviewed following labs and imaging studies  CBC: Recent Labs  Lab 11/13/19 0737  WBC 5.9  HGB 9.5*  HCT 33.6*  MCV 74.8*  PLT 290   Basic Metabolic Panel: Recent Labs  Lab 11/13/19 0737    NA 138  K 3.8  CL 105  CO2 23  GLUCOSE 183*  BUN 18  CREATININE 0.80  CALCIUM 9.4   Liver Function Tests: No results for input(s): AST, ALT, ALKPHOS, BILITOT, PROT, ALBUMIN in the last 168 hours. Coagulation Profile: No results for input(s): INR, PROTIME in the last 168 hours. BNP (last 3 results) No results for input(s): PROBNP in the last 8760 hours. CBG: No results for input(s): GLUCAP in the last 168 hours. Thyroid Function Tests: No results for input(s): TSH, T4TOTAL, FREET4, T3FREE, THYROIDAB in the last 72 hours. Urine analysis:    Component Value Date/Time   COLORURINE YELLOW (A) 07/20/2016 1209   APPEARANCEUR CLEAR (A) 07/20/2016 1209   APPEARANCEUR Cloudy 08/10/2013 1229   LABSPEC 1.017 07/20/2016 1209   LABSPEC 1.024 08/10/2013 1229   PHURINE 7.0 07/20/2016 1209   GLUCOSEU 150 (A) 07/20/2016 1209   GLUCOSEU Negative 08/10/2013 1229   HGBUR NEGATIVE 07/20/2016 1209   BILIRUBINUR NEGATIVE 07/20/2016 1209   BILIRUBINUR Negative 08/10/2013 1229   KETONESUR NEGATIVE 07/20/2016 1209   PROTEINUR 30 (A) 07/20/2016 1209   UROBILINOGEN 0.2 09/06/2010 1027   NITRITE NEGATIVE 07/20/2016 1209   LEUKOCYTESUR NEGATIVE 07/20/2016 1209   LEUKOCYTESUR 3+ 08/10/2013 1229     Radiological Exams on Admission: DG Chest 2 View  Result Date: 11/13/2019 CLINICAL DATA:  Shortness of breath. EXAM: CHEST - 2 VIEW COMPARISON:  Apr 21, 2016. FINDINGS: Mild cardiomegaly is noted. Central pulmonary vascular congestion is noted with probable bilateral pulmonary edema. No pneumothorax or significant pleural effusion is noted. Bony thorax is unremarkable. IMPRESSION: Mild cardiomegaly with central pulmonary vascular congestion and probable bilateral pulmonary edema is noted consistent with congestive heart failure. No significant pleural effusion is noted. Electronically Signed   By: Lupita Raider M.D.   On: 11/13/2019 08:45    EKG: Independently reviewed.  Sinus  rhythm  Assessment/Plan  Principal Problem Hypertensive emergency -Patient systolic blood pressures is in the 200 range, she is relatively asymptomatic, has no headache, no blurry vision, and does not appear to feel that -We will start clonidine x1 to assist with short-term reduction, start lisinopril and Coreg, already received hydralazine and Lasix -BNP is high, she has chest x-ray consistent with pulmonary edema, will schedule Lasix -Goal blood pressure for her would be roughly around 160 systolic, would avoid dropping lower than that she may get symptomatic  Active Problems Acute CHF, unspecified -2D echo has been ordered and is pending -Start Lasix scheduled -Strict ins and outs, daily weights -Started on ACE inhibitor, beta-blocker  Type 2 diabetes mellitus -Hyperglycemic in the ED, will obtain hemoglobin A1c and place on sliding scale.  She was supposed to be on Metformin.  Obesity -Based on BMI of 46, patient will benefit from weight loss  She does not have a PCP, advised patient to establish care with [facility] as she lives in [facility], she is unaware of the [employer] and she would benefit from discharge regimen that  is part of that list  DVT prophylaxis: Lovenox Code Status: Full code Family Communication: No family at bedside Disposition Plan: Home when ready Bed Type: Telemetry Consults called: None Obs/Inp: obs  Pamella Pertostin Luvina Poirier, MD, PhD [facility]  Contact via [email or web address]  11/13/2019, 8:57 AM

## 2019-11-13 NOTE — ED Notes (Signed)
Pt ambulatory to toilet with steady gait.  

## 2019-11-13 NOTE — ED Notes (Signed)
Date and time results received: 11/13/19 0815  Test: troponin Critical Value: 102  Name of Provider Notified: Dr. Jacqualine Code

## 2019-11-13 NOTE — ED Notes (Signed)
Pt assisted with TV and provided lunch tray.

## 2019-11-13 NOTE — ED Notes (Signed)
ECHO at bedside.

## 2019-11-13 NOTE — ED Triage Notes (Signed)
Pt presents via pov from home with sob and htn. She reports that she has not taken blood pressure meds in a year and that she knows her pressure is up because of how she feels. Pt states she has been sob x 1 week and that she has swelling in right hand and left leg. Pt alert & oriented; nad noted.

## 2019-11-13 NOTE — ED Notes (Signed)
Pt ambulatory to toilet to urinate.  

## 2019-11-13 NOTE — ED Provider Notes (Signed)
[facility] Emergency Department Provider Note ____________________________________________   First MD Initiated Contact with Patient 11/13/19 [phone]     (approximate)  I have reviewed the triage vital signs and the nursing notes.  HISTORY  Chief Complaint Shortness of Breath and Hypertension   HPI [name] is a 53 y.o. female   here for evaluation for feeling short of breath for the last day  Patient reports that she is nose for the last few days a little bit of swelling in her hands and feet, and then also started feeling little short of breath yesterday.  She reports same symptoms were blood pressure was really high a while ago  She does not currently take any medication for her blood pressure.  Denies fevers chills cough or exposure to coronavirus.  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  No nausea or vomiting.  No sweats or fevers.  Past Medical History:  Diagnosis Date  . Diabetes mellitus without complication ([place])   . Hypertension     There are no problems to display for this patient.   History reviewed. No pertinent surgical history.  Prior to Admission medications   Medication Sig Start Date End Date Taking? Authorizing Provider  etodolac (LODINE) 400 MG tablet Take 1 tablet (400 mg total) by mouth 2 (two) times daily. Patient not taking: Reported on 03/01/2017 07/20/16   Johnn Hai, PA-C  Ferrous Sulfate (IRON) 325 (65 FE) MG TABS Take 1 tablet by mouth daily. Patient not taking: Reported on 03/01/2017 09/29/15   Duanne Guess, PA-C  hydrochlorothiazide (HYDRODIURIL) 25 MG tablet Take 1 tablet (25 mg total) by mouth daily. 03/01/17   Lavonia Drafts, MD  lisinopril (PRINIVIL,ZESTRIL) 10 MG tablet Take 1 tablet (10 mg total) by mouth daily. 03/01/17 03/01/18  Lavonia Drafts, MD    metFORMIN (GLUCOPHAGE) 500 MG tablet Take 500 mg by mouth 2 (two) times daily with a meal.    [provider]  naproxen (NAPROSYN) 500 MG tablet Take 1 tablet (500 mg total) by mouth 2 (two) times daily with a meal. Patient not taking: Reported on 03/01/2017 06/30/16   Arlyss Repress, PA-C  oxyCODONE-acetaminophen (ROXICET) 5-325 MG tablet Take 1-2 tablets by mouth every 4 (four) hours as needed for severe pain. Patient not taking: Reported on 03/01/2017 06/30/16   Arlyss Repress, PA-C    Allergies Patient has no known allergies.  History reviewed. No pertinent family history.  Social History Social History   Tobacco Use  . Smoking status: Never Smoker  . Smokeless tobacco: Never Used  Substance Use Topics  . Alcohol use: No  . Drug use: No    Review of Systems Constitutional: No fever/chills Eyes: No visual changes. ENT: No sore throat. Cardiovascular: Denies chest pain. Respiratory: Slight feeling of shortness of breath for the last day Gastrointestinal: No abdominal pain.   Genitourinary: Negative for dysuria. Musculoskeletal: Negative for back pain. Skin: Negative for rash. Neurological: Negative for headaches, areas of focal weakness or numbness.    ____________________________________________   PHYSICAL EXAM:  VITAL SIGNS: ED Triage Vitals  Enc Vitals Group     BP 11/13/19 0716 (!) 268/119     Pulse Rate 11/13/19 0716 97     Resp 11/13/19 0716 20     Temp 11/13/19 0716 98.3 F (36.8  C)     Temp Source 11/13/19 0716 Oral     SpO2 11/13/19 0716 98 %     Weight 11/13/19 0717 288 lb (130.6 kg)     Height 11/13/19 0717 5\' 6"  (1.676 m)     Head Circumference --      Peak Flow --      Pain Score 11/13/19 0716 7     Pain Loc --      Pain Edu? --      Excl. in GC? --     Constitutional: Alert and oriented. Well appearing and in no acute distress.  Sitting upright, pleasant and conversant with no distress. Eyes: Conjunctivae are normal. Head:  Atraumatic. Nose: No congestion/rhinnorhea. Mouth/Throat: Mucous membranes are moist. Neck: No stridor.  Cardiovascular: Normal rate, regular rhythm. Grossly normal heart sounds.  Good peripheral circulation. Respiratory: Normal respiratory effort.  No retractions. Lungs CTAB.  Speaks in full clear sentences. Gastrointestinal: Soft and nontender. No distention. Musculoskeletal: No lower extremity tenderness severely trace edema of the hands and ankles. Neurologic:  Normal speech and language. No gross focal neurologic deficits are appreciated.  Skin:  Skin is warm, dry and intact. No rash noted. Psychiatric: Mood and affect are normal. Speech and behavior are normal.  ____________________________________________   LABS (all labs ordered are listed, but only abnormal results are displayed)  Labs Reviewed  CBC - Abnormal; Notable for the following components:      Result Value   Hemoglobin 9.5 (*)    HCT 33.6 (*)    MCV 74.8 (*)    MCH 21.2 (*)    MCHC 28.3 (*)    RDW 20.7 (*)    All other components within normal limits  BASIC METABOLIC PANEL - Abnormal; Notable for the following components:   Glucose, Bld 183 (*)    All other components within normal limits  TROPONIN I (HIGH SENSITIVITY) - Abnormal; Notable for the following components:   Troponin I (High Sensitivity) 102 (*)    All other components within normal limits  SARS CORONAVIRUS 2 (TAT 6-24 HRS)  BRAIN NATRIURETIC PEPTIDE   ____________________________________________  EKG  Reviewed inter by me at 7:20 AM Heart rate 90 QRS 120 QTc 460 Normal sinus rhythm, left axis deviation, there is new compared with previous EKG T wave inversion and slight depression in 1 and aVL.  This is new compared to previous.  Of note, the patient is not complaining of any chest pain, but does report dyspnea at this time ____________________________________________  RADIOLOGY  No results found.  DG Chest 2 View  Result Date:  11/13/2019 CLINICAL DATA:  Shortness of breath. EXAM: CHEST - 2 VIEW COMPARISON:  Apr 21, 2016. FINDINGS: Mild cardiomegaly is noted. Central pulmonary vascular congestion is noted with probable bilateral pulmonary edema. No pneumothorax or significant pleural effusion is noted. Bony thorax is unremarkable. IMPRESSION: Mild cardiomegaly with central pulmonary vascular congestion and probable bilateral pulmonary edema is noted consistent with congestive heart failure. No significant pleural effusion is noted. Electronically Signed   By: Lupita RaiderJames  Green Jr M.D.   On: 11/13/2019 08:45    Chest x-ray reviewed by me ____________________________________________   PROCEDURES  Procedure(s) performed: None  Procedures  Critical Care performed: Yes, see critical care note(s)  CRITICAL CARE Performed by: Sharyn CreamerMark Shanee Batch   Total critical care time: 30 minutes  Critical care time was exclusive of separately billable procedures and treating other patients.  Critical care was necessary to treat or prevent imminent or life-threatening  deterioration.  Critical care was time spent personally by me on the following activities: development of treatment plan with patient and/or surrogate as well as nursing, discussions with consultants, evaluation of patient's response to treatment, examination of patient, obtaining history from patient or surrogate, ordering and performing treatments and interventions, ordering and review of laboratory studies, ordering and review of radiographic studies, pulse oximetry and re-evaluation of patient's condition.  ____________________________________________   INITIAL IMPRESSION / ASSESSMENT AND PLAN / ED COURSE  Pertinent labs & imaging results that were available during my care of the patient were reviewed by me and considered in my medical decision making (see chart for details).   Presents for evaluation of shortness of breath and swelling in her extremities.  She appears  to have had a similar history of this and has severe uncontrolled hypertension.  I am concerned that the patient has hypertensive emergency or hypertensive urgency.  She denies acute infectious symptoms, reports negative Covid test on Thursday which is done routinely at her employment  There is no associated chest pain but EKG changes are present concerning for possible associated ischemia.  Patient is awake alert oriented tolerating severe hypertension quite well.  [name] was evaluated in Emergency Department on 11/13/2019 for the symptoms described in the history of present illness. She was evaluated in the context of the global COVID-19 pandemic, which necessitated consideration that the patient might be at risk for infection with the SARS-CoV-2 virus that causes COVID-19. Institutional protocols and algorithms that pertain to the evaluation of patients at risk for COVID-19 are in a state of rapid change based on information released by regulatory bodies including the CDC and federal and state organizations. These policies and algorithms were followed during the patient's care in the ED.  ----------------------------------------- 8:24 AM on 11/13/2019 -----------------------------------------  Patient resting.  Blood pressure remains severely elevated.  Chest x-ray reviewed by me, concerning for severe cardiomegaly and pulmonary edema, doubt atypical infection based on her clinical history.  Will provide Lasix, hydralazine, blood pressure control and diuresis is current plan for.  Discussed with the patient she is comfortable with this plan understanding of need to be hospitalized as I am highly concerned she has hypertensive emergency with exhibited changes of cardiomegaly and pulmonary edema/CHF.       Patient admitted, blood pressure improving.  Diuresing well at this time.  Hospitalist service admitting ____________________________________________   FINAL CLINICAL  IMPRESSION(S) / ED DIAGNOSES  Final diagnoses:  Hypertensive emergency  Acute congestive heart failure, unspecified heart failure type [facility])        Note:  This document was prepared using Dragon voice recognition software and may include unintentional dictation errors       Sharyn Creamer, MD 11/13/19 1035

## 2019-11-13 NOTE — ED Notes (Signed)
Patient assisted to the bathroom 

## 2019-11-13 NOTE — ED Notes (Signed)
Pt ambulatory to toilet with steady gait noted.  

## 2019-11-14 DIAGNOSIS — E1165 Type 2 diabetes mellitus with hyperglycemia: Secondary | ICD-10-CM

## 2019-11-14 DIAGNOSIS — I160 Hypertensive urgency: Secondary | ICD-10-CM

## 2019-11-14 DIAGNOSIS — I5031 Acute diastolic (congestive) heart failure: Secondary | ICD-10-CM

## 2019-11-14 LAB — COMPREHENSIVE METABOLIC PANEL
ALT: 16 U/L (ref 0–44)
AST: 18 U/L (ref 15–41)
Albumin: 3.5 g/dL (ref 3.5–5.0)
Alkaline Phosphatase: 73 U/L (ref 38–126)
Anion gap: 8 (ref 5–15)
BUN: 20 mg/dL (ref 6–20)
CO2: 26 mmol/L (ref 22–32)
Calcium: 10 mg/dL (ref 8.9–10.3)
Chloride: 103 mmol/L (ref 98–111)
Creatinine, Ser: 0.75 mg/dL (ref 0.44–1.00)
GFR calc Af Amer: >60 mL/min (ref >60)
GFR calc non Af Amer: >60 mL/min (ref >60)
Glucose, Bld: 220 mg/dL — ABNORMAL HIGH (ref 70–99)
Potassium: 3.6 mmol/L (ref 3.5–5.1)
Sodium: 137 mmol/L (ref 135–145)
Total Bilirubin: 1 mg/dL (ref 0.3–1.2)
Total Protein: 7.8 g/dL (ref 6.5–8.1)

## 2019-11-14 LAB — CBC
HCT: 34.2 % — ABNORMAL LOW (ref 36.0–46.0)
Hemoglobin: 10.2 g/dL — ABNORMAL LOW (ref 12.0–15.0)
MCH: 21.9 pg — ABNORMAL LOW (ref 26.0–34.0)
MCHC: 29.8 g/dL — ABNORMAL LOW (ref 30.0–36.0)
MCV: 73.5 fL — ABNORMAL LOW (ref 80.0–100.0)
Platelets: 301 10*3/uL (ref 150–400)
RBC: 4.65 MIL/uL (ref 3.87–5.11)
RDW: 20.9 % — ABNORMAL HIGH (ref 11.5–15.5)
WBC: 8.2 10*3/uL (ref 4.0–10.5)
nRBC: 0 % (ref 0.0–0.2)

## 2019-11-14 LAB — HEMOGLOBIN A1C
Hgb A1c MFr Bld: 7.9 % — ABNORMAL HIGH (ref 4.8–5.6)
Mean Plasma Glucose: 180.03 mg/dL

## 2019-11-14 LAB — GLUCOSE, CAPILLARY
Glucose-Capillary: 146 mg/dL — ABNORMAL HIGH (ref 70–99)
Glucose-Capillary: 190 mg/dL — ABNORMAL HIGH (ref 70–99)
Glucose-Capillary: 170 mg/dL — ABNORMAL HIGH (ref 70–99)
Glucose-Capillary: 138 mg/dL — ABNORMAL HIGH (ref 70–99)

## 2019-11-14 MED ORDER — METFORMIN HCL 500 MG PO TABS
500.0000 mg | ORAL_TABLET | Freq: Two times a day (BID) | ORAL | Status: DC
  Administered 2019-11-14 – 2019-11-15 (×2): 500 mg via ORAL
  Filled 2019-11-14 (×2): qty 1

## 2019-11-14 MED ORDER — HYDRALAZINE HCL 20 MG/ML IJ SOLN: 10.0000 mg | INTRAMUSCULAR | Status: DC | PRN

## 2019-11-14 MED ORDER — SODIUM CHLORIDE 0.9% FLUSH
10.0000 mL | Freq: Two times a day (BID) | INTRAVENOUS | Status: DC
  Administered 2019-11-14 – 2019-11-15 (×2): 10 mL via INTRAVENOUS

## 2019-11-14 MED ORDER — AMLODIPINE BESYLATE 5 MG PO TABS
5.0000 mg | ORAL_TABLET | Freq: Every day | ORAL | Status: DC
  Administered 2019-11-14 – 2019-11-15 (×2): 5 mg via ORAL
  Filled 2019-11-14 (×2): qty 1

## 2019-11-14 MED ORDER — POTASSIUM CHLORIDE CRYS ER 20 MEQ PO TBCR
20.0000 meq | EXTENDED_RELEASE_TABLET | Freq: Two times a day (BID) | ORAL | Status: DC
  Administered 2019-11-14 – 2019-11-15 (×2): 20 meq via ORAL
  Filled 2019-11-14 (×2): qty 1

## 2019-11-14 NOTE — Progress Notes (Signed)
Ch f/u with pt regarding OR for AD education. Pt declined with the education at this time but shared that she has been hospitalized due to complications related to uncontrolled blood pressure. Pt shared that she was SOB and had swelling on her legs (which has improved as reported by pt). Pt shared that her limitations to maintain her BP is because of the financial strain of the medication cost. Ch encouraged pt to share this with the provider or SW before she is d/c. Ch suggested [facility] to help w/ medicine cost but the pt lives in [place]. Pt would benefit from a SW consult. Ch provided a compassionate presence and encourage pt to let the staff know if she has further needs that should be addressed.     11/14/19 1200  Clinical Encounter Type  Visited With Patient  Visit Type Follow-up;Social support;Other (Comment) (AD edu )  Referral From Chaplain  Consult/Referral To Chaplain  Recommendations consult to SW   Spiritual Encounters  Spiritual Needs Emotional;Grief support  Stress Factors  Patient Stress Factors Financial concerns;Health changes;Exhausted;Major life changes;Loss of control  Family Stress Factors None identified  Mental Health Advance Directives  Would patient like information on creating a mental health advance directive? Yes (Inpatient - patient defers creating a mental health advance directive at this time - Information given)

## 2019-11-14 NOTE — Progress Notes (Signed)
Pt new BP is 160/64 Pulse 81. MD notified of new BP.

## 2019-11-14 NOTE — Progress Notes (Signed)
Patient ID: [name], female   DOB: June 22, 1966, 53 y.o.   MRN: [id number] [facility] PROGRESS NOTE  [name] RCV:[id number] DOB: Jul 05, 1966 DOA: 11/13/2019 [facility]: [facility]  HPI/Subjective: Patient coming in not feeling well.  She felt her whole body was swollen.  Felt short of breath.  Some blurred vision.  Some headache.  Objective: Vitals:   11/14/19 0721 11/14/19 0941  BP: (!) 196/85 (!) 170/76  Pulse: 71   Resp:    Temp:    SpO2:      Intake/Output Summary (Last 24 hours) at 11/14/2019 1342 Last data filed at 11/14/2019 1100 Gross per 24 hour  Intake --  Output 1600 ml  Net -1600 ml   Filed Weights   11/13/19 0717 11/13/19 2143 11/14/19 0245  Weight: 130.6 kg 121.2 kg 120.3 kg    ROS: Review of Systems  Constitutional: Negative for chills and fever.  Eyes: Positive for blurred vision.  Respiratory: Positive for shortness of breath. Negative for cough.   Cardiovascular: Negative for chest pain.  Gastrointestinal: Negative for abdominal pain, constipation, diarrhea, nausea and vomiting.  Genitourinary: Negative for dysuria.  Musculoskeletal: Negative for joint pain.  Neurological: Positive for headaches. Negative for dizziness.   Exam: Physical Exam  Constitutional: She is oriented to person, place, and time.  HENT:  Nose: No mucosal edema.  Mouth/Throat: No oropharyngeal exudate or posterior oropharyngeal edema.  Eyes: Pupils are equal, round, and reactive to light. Conjunctivae and lids are normal.  Neck: Carotid bruit is not present.  Cardiovascular: S1 normal and S2 normal. Exam reveals no gallop.  No murmur heard. Respiratory: No respiratory distress. She has decreased breath sounds in the right lower field and the left lower field. She has no wheezes. She has no rhonchi. She has no rales.  GI: Soft. Bowel sounds are normal. There is no abdominal tenderness.  Musculoskeletal:     Right ankle:  Swelling present.     Left ankle: Swelling present.  Lymphadenopathy:    She has no cervical adenopathy.  Neurological: She is alert and oriented to person, place, and time. No cranial nerve deficit.  Skin: Skin is warm. No rash noted. Nails show no clubbing.  Psychiatric: She has a normal mood and affect.      Data Reviewed: Basic Metabolic Panel: Recent Labs  Lab 11/13/19 0737 11/14/19 0447  NA 138 137  K 3.8 3.6  CL 105 103  CO2 23 26  GLUCOSE 183* 220*  BUN 18 20  CREATININE 0.80 0.75  CALCIUM 9.4 10.0   Liver Function Tests: Recent Labs  Lab 11/14/19 0447  AST 18  ALT 16  ALKPHOS 73  BILITOT 1.0  PROT 7.8  ALBUMIN 3.5   CBC: Recent Labs  Lab 11/13/19 0737 11/14/19 0447  WBC 5.9 8.2  HGB 9.5* 10.2*  HCT 33.6* 34.2*  MCV 74.8* 73.5*  PLT 290 301   BNP (last 3 results) Recent Labs    11/13/19 0737  BNP 823.0*    CBG: Recent Labs  Lab 11/13/19 1208 11/13/19 1734 11/13/19 2140 11/14/19 0720 11/14/19 1116  GLUCAP 168* 204* 176* 146* 190*    Recent Results (from the past 240 hour(s))  SARS CORONAVIRUS 2 (TAT 6-24 HRS) Nasopharyngeal Nasopharyngeal Swab     Status: None   Collection Time: 11/13/19  8:24 AM   Specimen: Nasopharyngeal Swab  Result Value Ref Range Status   SARS Coronavirus 2 NEGATIVE NEGATIVE Final    Comment: (NOTE) SARS-CoV-2  target nucleic acids are NOT DETECTED. The SARS-CoV-2 RNA is generally detectable in upper and lower respiratory specimens during the acute phase of infection. Negative results do not preclude SARS-CoV-2 infection, do not rule out co-infections with other pathogens, and should not be used as the sole basis for treatment or other patient management decisions. Negative results must be combined with clinical observations, patient history, and epidemiological information. The expected result is Negative. Fact Sheet for Patients: HairSlick.nohttps://www.fda.gov/media/138098/download Fact Sheet for Healthcare  Providers: quierodirigir.comhttps://www.fda.gov/media/138095/download This test is not yet approved or cleared by the [place] FDA and  has been authorized for detection and/or diagnosis of SARS-CoV-2 by FDA under an Emergency Use Authorization (EUA). This EUA will remain  in effect (meaning this test can be used) for the duration of the COVID-19 declaration under Section 56 4(b)(1) of the Act, 21 U.S.C. section 360bbb-3(b)(1), unless the authorization is terminated or revoked sooner. Performed at [facility], [address] [place], [place] [address]      Studies: DG Chest 2 View  Result Date: 11/13/2019 CLINICAL DATA:  Shortness of breath. EXAM: CHEST - 2 VIEW COMPARISON:  Apr 21, 2016. FINDINGS: Mild cardiomegaly is noted. Central pulmonary vascular congestion is noted with probable bilateral pulmonary edema. No pneumothorax or significant pleural effusion is noted. Bony thorax is unremarkable. IMPRESSION: Mild cardiomegaly with central pulmonary vascular congestion and probable bilateral pulmonary edema is noted consistent with congestive heart failure. No significant pleural effusion is noted. Electronically Signed   By: Lupita RaiderJames  Green Jr M.D.   On: 11/13/2019 08:45   ECHOCARDIOGRAM COMPLETE  Result Date: 11/13/2019   ECHOCARDIOGRAM REPORT   Patient Name:   Wendy FearMARY Griffin [facility] Date of Exam: 11/13/2019 Medical Rec #:  [id number]             Height:       66.0 in Accession #:    [id number]            Weight:       288.0 lb Date of Birth:  1966/05/04            BSA:          2.34 m Patient Age:    52 years              BP:           256/120 mmHg Patient Gender: F                     HR:           48 bpm. Exam Location:  [facility] Procedure: 2D Echo and Intracardiac Opacification Agent Indications:     Hypertensive Emergency, Elevated Troponin  History:         Patient has no prior history of Echocardiogram examinations.                  Risk Factors:Morbid Obesity.  Sonographer:     LTM Referring  Phys:  [id number] Leatha GildingOSTIN M GHERGHE Diagnosing Phys: Dietrich PatesPaula Ross MD  Sonographer Comments: Patient is morbidly obese and Technically challenging study due to limited acoustic windows. IMPRESSIONS  1. Left ventricular ejection fraction, by visual estimation, is 55 to 60%. The left ventricle has normal function. There is mildly increased left ventricular hypertrophy.  2. Definity contrast agent was given IV to delineate the left ventricular endocardial borders.  3. Moderately dilated left ventricular internal cavity size.  4. The left ventricle has no regional wall motion abnormalities.  5.  Extremely poor acoustic windows Definity used. No gross wall motion abnormalities.  6. Global right ventricle has normal systolic function.The right ventricular size is normal. Right vetricular wall thickness was not assessed.  7. Left atrial size was normal.  8. Right atrial size was normal.  9. Mild mitral annular calcification. 10. The mitral valve is grossly normal. Trivial mitral valve regurgitation. 11. The tricuspid valve is grossly normal. Tricuspid valve regurgitation is not demonstrated. 12. The aortic valve is grossly normal. Aortic valve regurgitation is not visualized. 13. The pulmonic valve was not well visualized. Pulmonic valve regurgitation is trivial. 14. The inferior vena cava is normal in size with greater than 50% respiratory variability, suggesting right atrial pressure of 3 mmHg. 15. The interatrial septum was not assessed. FINDINGS  Left Ventricle: Left ventricular ejection fraction, by visual estimation, is 55 to 60%. The left ventricle has normal function. Definity contrast agent was given IV to delineate the left ventricular endocardial borders. The left ventricle has no regional wall motion abnormalities. The left ventricular internal cavity size was moderately dilated left ventricle. There is mildly increased left ventricular hypertrophy. Extremely poor acoustic windows Definity used. No gross wall motion  abnormalities. Right Ventricle: The right ventricular size is normal. Right vetricular wall thickness was not assessed. Global RV systolic function is has normal systolic function. Left Atrium: Left atrial size was normal in size. Right Atrium: Right atrial size was normal in size Pericardium: There is no evidence of pericardial effusion. Mitral Valve: The mitral valve is grossly normal. Mild mitral annular calcification. Trivial mitral valve regurgitation. Tricuspid Valve: The tricuspid valve is grossly normal. Tricuspid valve regurgitation is not demonstrated. Aortic Valve: The aortic valve is grossly normal. Aortic valve regurgitation is not visualized. Aortic regurgitation PHT measures 597 msec. Aortic valve mean gradient measures 3.0 mmHg. Aortic valve peak gradient measures 5.3 mmHg. Aortic valve area, by VTI measures 3.18 cm. Pulmonic Valve: The pulmonic valve was not well visualized. Pulmonic valve regurgitation is trivial. Pulmonic regurgitation is trivial. Aorta: The aortic root is normal in size and structure. Venous: The inferior vena cava is normal in size with greater than 50% respiratory variability, suggesting right atrial pressure of 3 mmHg. IAS/Shunts: The interatrial septum was not assessed.  LEFT VENTRICLE PLAX 2D LVIDd:         6.04 cm  Diastology LVIDs:         4.79 cm  LV e' lateral:   4.03 cm/s LV PW:         1.22 cm  LV E/e' lateral: 20.0 LV IVS:        1.20 cm  LV e' medial:    3.92 cm/s LVOT diam:     2.60 cm  LV E/e' medial:  20.5 LV SV:         76 ml LV SV Index:   29.83 LVOT Area:     5.31 cm  RIGHT VENTRICLE RV S prime:     13.70 cm/s LEFT ATRIUM            Index LA Vol (A4C): 110.0 ml 47.11 ml/m  AORTIC VALVE AV Area (Vmax):    3.38 cm AV Area (Vmean):   3.44 cm AV Area (VTI):     3.18 cm AV Vmax:           115.00 cm/s AV Vmean:          73.400 cm/s AV VTI:            0.229 m AV  Peak Grad:      5.3 mmHg AV Mean Grad:      3.0 mmHg LVOT Vmax:         73.20 cm/s LVOT Vmean:         47.600 cm/s LVOT VTI:          0.137 m LVOT/AV VTI ratio: 0.60 AI PHT:            597 msec MITRAL VALVE MV Area (PHT): 4.00 cm             SHUNTS MV PHT:        55.00 msec           Systemic VTI:  0.14 m MV E velocity: 80.50 cm/s 103 cm/s  Systemic Diam: 2.60 cm MV A velocity: 28.60 cm/s 70.3 cm/s MV E/A ratio:  2.81       1.5  Dorris Carnes MD Electronically signed by Dorris Carnes MD Signature Date/Time: 11/13/2019/2:41:18 PM    Final     Scheduled Meds: . amLODipine  5 mg Oral Daily  . carvedilol  6.25 mg Oral BID WC  . enoxaparin (LOVENOX) injection  40 mg Subcutaneous Q12H  . furosemide  40 mg Intravenous Q12H  . influenza vac split quadrivalent PF  0.5 mL Intramuscular Tomorrow-1000  . insulin aspart  0-5 Units Subcutaneous QHS  . insulin aspart  0-9 Units Subcutaneous TID WC  . lisinopril  20 mg Oral Daily  . metFORMIN  500 mg Oral BID WC  . pneumococcal 23 valent vaccine  0.5 mL Intramuscular Tomorrow-1000   Continuous Infusions:  Assessment/Plan:  1. Hypertensive urgency.  The patient has not been on medications for blood pressure for about a year and a half.  I would rather get her blood pressure down slowly.  She was started on low-dose lisinopril IV Lasix and Coreg.  I added a little Norvasc in the meantime.  Continue to trend blood pressure daily.  Blood pressure this afternoon is much better than on presentation.  Hopefully will be able to go home tomorrow. 2. Type 2 diabetes mellitus uncontrolled with a hemoglobin A1c of 7.9.  Start low-dose Glucophage. 3. Morbid obesity with a BMI of 42.8.  Diet exercise and weight loss discussed. 4. Acute diastolic congestive heart failure secondary to hypertensive urgency.  Patient being diuresed with IV Lasix.  Continue to monitor today.  Code Status:     Code Status Orders  (From admission, onward)         Start     Ordered   11/13/19 0839  Full code  Continuous     11/13/19 0839        Code Status History    This patient has a  current code status but no historical code status.   Advance Care Planning Activity     Family Communication: Patient deferred me calling family. Disposition Plan: Hopefully tomorrow will be able to discharge home.  Will need transition care team to help out with [facility] appointment and potentially medication management.  Time spent: 28 minutes  [place]

## 2019-11-14 NOTE — Progress Notes (Signed)
Not able to perform ReDs vest reading due to pt. BMI being 43.14

## 2019-11-14 NOTE — Plan of Care (Signed)
  Problem: Education: Goal: Knowledge of General Education information will improve Description: Including pain rating scale, medication(s)/side effects and non-pharmacologic comfort measures Outcome: Progressing   Problem: Clinical Measurements: Goal: Ability to maintain clinical measurements within normal limits will improve Outcome: Progressing Goal: Cardiovascular complication will be avoided Outcome: Progressing  Pt. Bp is declining slowly and her pulse is WNL

## 2019-11-14 NOTE — Plan of Care (Signed)
  Problem: Education: Goal: Knowledge of General Education information will improve Description: Including pain rating scale, medication(s)/side effects and non-pharmacologic comfort measures Outcome: Progressing   Problem: Clinical Measurements: Goal: Diagnostic test results will improve Outcome: Progressing Goal: Cardiovascular complication will be avoided Outcome: Progressing   

## 2019-11-14 NOTE — Progress Notes (Signed)
Pt BP is 206/90. MD notified and given orders to give Hydralazine 10mg  IV.

## 2019-11-15 LAB — BASIC METABOLIC PANEL
Anion gap: 10 (ref 5–15)
BUN: 23 mg/dL — ABNORMAL HIGH (ref 6–20)
CO2: 27 mmol/L (ref 22–32)
Calcium: 9.7 mg/dL (ref 8.9–10.3)
Chloride: 103 mmol/L (ref 98–111)
Creatinine, Ser: 0.81 mg/dL (ref 0.44–1.00)
GFR calc Af Amer: >60 mL/min (ref >60)
GFR calc non Af Amer: >60 mL/min (ref >60)
Glucose, Bld: 176 mg/dL — ABNORMAL HIGH (ref 70–99)
Potassium: 3.7 mmol/L (ref 3.5–5.1)
Sodium: 140 mmol/L (ref 135–145)

## 2019-11-15 LAB — GLUCOSE, CAPILLARY
Glucose-Capillary: 161 mg/dL — ABNORMAL HIGH (ref 70–99)
Glucose-Capillary: 190 mg/dL — ABNORMAL HIGH (ref 70–99)

## 2019-11-15 LAB — MAGNESIUM: Magnesium: 1.9 mg/dL (ref 1.7–2.4)

## 2019-11-15 MED ORDER — AMLODIPINE BESYLATE 5 MG PO TABS: 5.0000 mg | ORAL_TABLET | Freq: Every day | ORAL | 0 refills | Status: DC

## 2019-11-15 MED ORDER — CARVEDILOL 6.25 MG PO TABS: 6.2500 mg | ORAL_TABLET | Freq: Two times a day (BID) | ORAL | 0 refills | Status: DC

## 2019-11-15 MED ORDER — FUROSEMIDE 40 MG PO TABS: 40.0000 mg | ORAL_TABLET | Freq: Every day | ORAL | 0 refills | Status: DC

## 2019-11-15 MED ORDER — LISINOPRIL 20 MG PO TABS: 20.0000 mg | ORAL_TABLET | Freq: Every day | ORAL | 0 refills | Status: DC

## 2019-11-15 MED ORDER — METFORMIN HCL 500 MG PO TABS: 500.0000 mg | ORAL_TABLET | Freq: Two times a day (BID) | ORAL | 0 refills | Status: DC

## 2019-11-15 NOTE — Progress Notes (Signed)
Discharge instructions explained to pt/ verbalized an understanding / iv and tele removed/ transported off unit via wheelchair.  

## 2019-11-15 NOTE — Discharge Instructions (Signed)
Heart Failure, Diagnosis ° °Heart failure means that your heart is not able to pump blood in the right way. This makes it hard for your body to work well. Heart failure is usually a long-term (chronic) condition. You must take good care of yourself and follow your treatment plan from your doctor. °What are the causes? °This condition may be caused by: °· High blood pressure. °· Build up of cholesterol and fat in the arteries. °· Heart attack. This injures the heart muscle. °· Heart valves that do not open and close properly. °· Damage of the heart muscle. This is also called cardiomyopathy. °· Lung disease. °· Abnormal heart rhythms. °What increases the risk? °The risk of heart failure goes up as a person ages. This condition is also more likely to develop in people who: °· Are overweight. °· Are female. °· Smoke or chew tobacco. °· Abuse alcohol or illegal drugs. °· Have taken medicines that can damage the heart. °· Have diabetes. °· Have abnormal heart rhythms. °· Have thyroid problems. °· Have low blood counts (anemia). °What are the signs or symptoms? °Symptoms of this condition include: °· Shortness of breath. °· Coughing. °· Swelling of the feet, ankles, legs, or belly. °· Losing weight for no reason. °· Trouble breathing. °· Waking from sleep because of the need to sit up and get more air. °· Rapid heartbeat. °· Being very tired. °· Feeling dizzy, or feeling like you may pass out (faint). °· Having no desire to eat. °· Feeling like you may vomit (nauseous). °· Peeing (urinating) more at night. °· Feeling confused. °How is this treated? ° °  ° °This condition may be treated with: °· Medicines. These can be given to treat blood pressure and to make the heart muscles stronger. °· Changes in your daily life. These may include eating a healthy diet, staying at a healthy body weight, quitting tobacco and illegal drug use, or doing exercises. °· Surgery. Surgery can be done to open blocked valves, or to put devices in  the heart, such as pacemakers. °· A donor heart (heart transplant). You will receive a healthy heart from a donor. °Follow these instructions at home: °· Treat other conditions as told by your doctor. These may include high blood pressure, diabetes, thyroid disease, or abnormal heart rhythms. °· Learn as much as you can about heart failure. °· Get support as you need it. °· Keep all follow-up visits as told by your doctor. This is important. °Summary °· Heart failure means that your heart is not able to pump blood in the right way. °· This condition is caused by high blood pressure, heart attack, or damage of the heart muscle. °· Symptoms of this condition include shortness of breath and swelling of the feet, ankles, legs, or belly. You may also feel very tired or feel like you may vomit. °· You may be treated with medicines, surgery, or changes in your daily life. °· Treat other health conditions as told by your doctor. °This information is not intended to replace advice given to you by your health care provider. Make sure you discuss any questions you have with your health care provider. °Document Released: 08/27/2008 Document Revised: 02/05/2019 Document Reviewed: 02/05/2019 °Elsevier Patient Education © 2020 Elsevier Inc. ° °

## 2019-11-15 NOTE — Progress Notes (Addendum)
Patient ID: [name],   The patient was admitted to [facility] on 11/13/2019 and discharged 11/15/2019.  Please excuse from work during this time. Patient may return to work without restrictions on 11/20/2019.  Dr Loletha Grayer [facility]

## 2019-11-15 NOTE — Discharge Summary (Signed)
[facility] NAME: [name]    MR#:  [id number]  DATE OF BIRTH:  November 18, 1966  DATE OF ADMISSION:  11/13/2019 ADMITTING PHYSICIAN: Loletha Grayer, MD  DATE OF DISCHARGE: 11/15/2019  PRIMARY CARE PHYSICIAN: [facility]    ADMISSION DIAGNOSIS:  Hypertensive urgency [I16.0] Hypertensive emergency [I16.1] Acute congestive heart failure, unspecified heart failure type ([place]) [I50.9]  DISCHARGE DIAGNOSIS:  Active Problems:   Hypertensive urgency   Uncontrolled type 2 diabetes mellitus with hyperglycemia (HCC)   Acute diastolic CHF (congestive heart failure) (HCC)   Obesity, Class III, BMI 40-49.9 (morbid obesity) ([place])   SECONDARY DIAGNOSIS:   Past Medical History:  Diagnosis Date  . Diabetes mellitus without complication ([place])   . Hypertension     HOSPITAL COURSE:   1.  Hypertensive urgency.  The patient has not been on medications for blood pressure in about a year and a half.  I would rather get her blood pressure down slowly.  She was started on lisinopril, IV Lasix and Coreg here.  I added Norvasc.  The patient will be switched over to oral Lasix upon discharge home.  Blood pressure still on the higher side but steady state on these medications will happen at about 5 days.  Recheck blood pressure as outpatient.  Patient symptomatically feeling better.  Low-salt diet discussed. 2.  Acute diastolic congestive heart failure secondary to hypertensive urgency.  Patient was diuresed with IV Lasix.  Lungs clear upon discharge.  Switch over to oral Lasix upon discharge home. 3.  Type 2 diabetes mellitus uncontrolled with a hemoglobin A1c of 7.9.  Start low-dose Glucophage.  Prescribed a glucometer.  Diet exercise and weight loss discussed with the patient. 4.  Morbid obesity with a BMI of 42.43.  Diet exercise and weight loss discussed.  DISCHARGE CONDITIONS:   Satisfactory  CONSULTS OBTAINED:   None  DRUG ALLERGIES:  No Known Allergies  DISCHARGE MEDICATIONS:   Allergies as of 11/15/2019   No Known Allergies     Medication List    TAKE these medications   amLODipine 5 MG tablet Commonly known as: NORVASC Take 1 tablet (5 mg total) by mouth daily. Start taking on: November 16, 2019   carvedilol 6.25 MG tablet Commonly known as: COREG Take 1 tablet (6.25 mg total) by mouth 2 (two) times daily with a meal.   furosemide 40 MG tablet Commonly known as: Lasix Take 1 tablet (40 mg total) by mouth daily.   lisinopril 20 MG tablet Commonly known as: ZESTRIL Take 1 tablet (20 mg total) by mouth daily. Start taking on: November 16, 2019   metFORMIN 500 MG tablet Commonly known as: GLUCOPHAGE Take 1 tablet (500 mg total) by mouth 2 (two) times daily with a meal.            Durable Medical Equipment  (From admission, onward)         Start     Ordered   11/15/19 1018  DME Glucometer  Once     11/15/19 1017           DISCHARGE INSTRUCTIONS:   Follow-up [facility] 5 days Follow-up CHF clinic  If you experience worsening of your admission symptoms, develop shortness of breath, life threatening emergency, suicidal or homicidal thoughts you must seek medical attention immediately by calling 911 or calling your MD immediately  if symptoms less severe.  You Must read complete instructions/literature along with all the  possible adverse reactions/side effects for all the Medicines you take and that have been prescribed to you. Take any new Medicines after you have completely understood and accept all the possible adverse reactions/side effects.   Please note  You were cared for by a hospitalist during your hospital stay. If you have any questions about your discharge medications or the care you received while you were in the hospital after you are discharged, you can call the unit and asked to speak with the hospitalist on call if the hospitalist that  took care of you is not available. Once you are discharged, your primary care physician will handle any further medical issues. Please note that NO REFILLS for any discharge medications will be authorized once you are discharged, as it is imperative that you return to your primary care physician (or establish a relationship with a primary care physician if you do not have one) for your aftercare needs so that they can reassess your need for medications and monitor your lab values.    Today   CHIEF COMPLAINT:   Chief Complaint  Patient presents with  . Shortness of Breath  . Hypertension    HISTORY OF PRESENT ILLNESS:  [name]  is a 53 y.o. female coming in with shortness of breath and hypertension   VITAL SIGNS:  Blood pressure (!) 172/83, pulse 69, temperature 98.6 F (37 C), temperature source Oral, resp. rate 18, height 5\' 6"  (1.676 m), weight 119.3 kg, last menstrual period 10/01/2019, SpO2 98 %.  I/O:    Intake/Output Summary (Last 24 hours) at 11/15/2019 1532 Last data filed at 11/15/2019 1347 Gross per 24 hour  Intake 720 ml  Output 2300 ml  Net -1580 ml    PHYSICAL EXAMINATION:  GENERAL:  53 y.o.-year-old patient lying in the bed with no acute distress.  EYES: Pupils equal, round, reactive to light and accommodation. No scleral icterus. Extraocular muscles intact.  HEENT: Head atraumatic, normocephalic. Oropharynx and nasopharynx clear.  NECK:  Supple, no jugular venous distention. No thyroid enlargement, no tenderness.  LUNGS: Normal breath sounds bilaterally, no wheezing, rales,rhonchi or crepitation. No use of accessory muscles of respiration.  CARDIOVASCULAR: S1, S2 normal. No murmurs, rubs, or gallops.  ABDOMEN: Soft, non-tender, non-distended. Bowel sounds present. No organomegaly or mass.  EXTREMITIES: Trace pedal edema.no cyanosis, or clubbing.  NEUROLOGIC: Cranial nerves II through XII are intact. Muscle strength 5/5 in all extremities. Sensation intact.  Gait not checked.  PSYCHIATRIC: The patient is alert and oriented x 3.  SKIN: No obvious rash, lesion, or ulcer.   DATA REVIEW:   CBC Recent Labs  Lab 11/14/19 0447  WBC 8.2  HGB 10.2*  HCT 34.2*  PLT 301    Chemistries  Recent Labs  Lab 11/14/19 0447 11/15/19 0441  NA 137 140  K 3.6 3.7  CL 103 103  CO2 26 27  GLUCOSE 220* 176*  BUN 20 23*  CREATININE 0.75 0.81  CALCIUM 10.0 9.7  MG  --  1.9  AST 18  --   ALT 16  --   ALKPHOS 73  --   BILITOT 1.0  --     Microbiology Results  Results for orders placed or performed during the hospital encounter of 11/13/19  SARS CORONAVIRUS 2 (TAT 6-24 HRS) Nasopharyngeal Nasopharyngeal Swab     Status: None   Collection Time: 11/13/19  8:24 AM   Specimen: Nasopharyngeal Swab  Result Value Ref Range Status   SARS Coronavirus 2 NEGATIVE NEGATIVE Final  Comment: (NOTE) SARS-CoV-2 target nucleic acids are NOT DETECTED. The SARS-CoV-2 RNA is generally detectable in upper and lower respiratory specimens during the acute phase of infection. Negative results do not preclude SARS-CoV-2 infection, do not rule out co-infections with other pathogens, and should not be used as the sole basis for treatment or other patient management decisions. Negative results must be combined with clinical observations, patient history, and epidemiological information. The expected result is Negative. Fact Sheet for Patients: HairSlick.no Fact Sheet for Healthcare Providers: quierodirigir.com This test is not yet approved or cleared by the [place] FDA and  has been authorized for detection and/or diagnosis of SARS-CoV-2 by FDA under an Emergency Use Authorization (EUA). This EUA will remain  in effect (meaning this test can be used) for the duration of the COVID-19 declaration under Section 56 4(b)(1) of the Act, 21 U.S.C. section 360bbb-3(b)(1), unless the authorization is terminated  or revoked sooner. Performed at [facility], [address], [place], [place] [address]       Management plans discussed with the patient, and she is in agreement.  CODE STATUS:     Code Status Orders  (From admission, onward)         Start     Ordered   11/13/19 0839  Full code  Continuous     11/13/19 0839        Code Status History    This patient has a current code status but no historical code status.   Advance Care Planning Activity      TOTAL TIME TAKING CARE OF THIS PATIENT: 35 minutes.    Alford Highland M.D on 11/15/2019 at 3:32 PM  Between 7am to 6pm - Pager - [phone]  After 6pm go to [email or web address] - Social research officer, government  [facility]  CC: Primary care physician; [facility]

## 2019-11-15 NOTE — TOC Progression Note (Signed)
Transition of Care [facility]) - Progression Note    Patient Details  Name: Denice Cardon MRN: [id number] Date of Birth: Jan 12, 1966  [facility] CM/SW Contact  Ross Ludwig, [place] Phone Number: 11/15/2019, 1:32 PM  Clinical Narrative:     CSW provided Glucometer for patient, which was delivered to patient's room.       Expected Discharge Plan and Services           Expected Discharge Date: 11/15/19                                     Social Determinants of Health (SDOH) Interventions    Readmission Risk Interventions No flowsheet data found.

## 2019-11-16 ENCOUNTER — Telehealth: Payer: Self-pay | Admitting: Family

## 2019-11-16 NOTE — Telephone Encounter (Signed)
F/U with patient about discharge from hospital on 12/14 with a discharge survey. We addressed weight, diet, medications, pain scale, signs and symptoms and confirmed her new patient appt with Darylene Price for 12/22/ 20 @ 830am. Patient states shes well.

## 2019-11-22 NOTE — Progress Notes (Deleted)
   Patient ID: [name], female    DOB: [birth year], 53 y.o.   MRN: [id number]  HPI  [name] is a 53 y/o female with a history of   Echo report from 11/13/2019 reviewed and showed an EF of 55-60% along with trivial MR.   Admitted 11/13/2019 due to HTN and acute on chronic heart failure. Initially given IV lasix and then transitioned to oral diuretics. Started on antihypertensives. Discharged after 2 days.   She presents today for her initial visit with a chief complaint of   Review of Systems    Physical Exam   Assessment & Plan:  1: Chronic heart failure with preserved ejection fraction- - NYHA class - BNP 11/13/2019 was 823.0  2: HTN- - BP - BMP from 11/15/2019 reviewed and showed sodium 140, potassium 3.7, creatinine 0.81 and GFR >60  3: DM- - A1c 11/13/2019 was 7.9%

## 2019-11-23 ENCOUNTER — Ambulatory Visit: Payer: Self-pay | Admitting: Family

## 2019-12-01 NOTE — Progress Notes (Signed)
Patient ID: [name], female    DOB: 08-25-1966, 53 y.o.   MRN: [id number]  HPI  [name] is a 53 y/o female with a history of DM, HTN and chronic heart failure.   Echo report from 11/13/2019 reviewed and showed an EF of 55-60% along with trivial MR.   Admitted 11/13/2019 due to HTN and acute on chronic heart failure. Initially given IV lasix and then transitioned to oral diuretics. Started on antihypertensives. Discharged after 2 days.   She presents today for her initial visit with a chief complaint of minimal fatigue upon moderate exertion. She describes this as having been present for several months. She has associated difficulty sleeping and swelling in her right hand along with this. She denies any dizziness, arm/hand numbness, abdominal swelling, palpitations, pedal edema, chest pain, shortness of breath or cough.   She does not have any scales. She has a history of tendonitis in her right wrist area but doesn't recall any sort of injury. 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   Past Medical History:  Diagnosis Date  . CHF (congestive heart failure) (HCC)   . Diabetes mellitus without complication (HCC)   . Hypertension    History reviewed. No pertinent surgical history. History reviewed. No pertinent family history. Social History   Tobacco Use  . Smoking status: Never Smoker  . Smokeless tobacco: Never Used  Substance Use Topics  . Alcohol use: No   No Known Allergies Prior to Admission medications   Medication Sig Start Date End Date Taking? Authorizing Provider  amLODipine (NORVASC) 5 MG tablet Take 1 tablet (5 mg total) by mouth daily. 11/16/19  Yes Alford Highland, MD  carvedilol (COREG) 6.25 MG tablet Take 1 tablet (6.25 mg total) by mouth 2 (two) times daily with a meal. 11/15/19  Yes Wieting, Richard, MD  furosemide (LASIX) 40 MG tablet Take 1 tablet (40 mg total) by mouth daily.  11/15/19  Yes Wieting, Richard, MD  lisinopril (ZESTRIL) 20 MG tablet Take 1 tablet (20 mg total) by mouth daily. 11/16/19  Yes Alford Highland, MD  metFORMIN (GLUCOPHAGE) 500 MG tablet Take 1 tablet (500 mg total) by mouth 2 (two) times daily with a meal. 11/15/19  Yes Alford Highland, MD    Review of Systems  Constitutional: Positive for fatigue. Negative for appetite change.  HENT: Positive for postnasal drip. Negative for congestion and sore throat.   Eyes: Negative.   Respiratory: Negative for cough, chest tightness and shortness of breath.   Cardiovascular: Negative for chest pain, palpitations and leg swelling.  Gastrointestinal: Negative for abdominal distention and abdominal pain.  Endocrine: Negative.   Genitourinary: Negative.   Musculoskeletal: Positive for arthralgias (right arm w/ swelling).  Skin: Negative.   Allergic/Immunologic: Negative.   Neurological: Negative for dizziness, light-headedness and numbness.  Hematological: Negative for adenopathy. Does not bruise/bleed easily.  Psychiatric/Behavioral: Positive for sleep disturbance (due to arm pain). Negative for dysphoric mood. The patient is not nervous/anxious.    Vitals:   12/02/19 0835 12/02/19 0924  BP: (!) 234/90 (!) 220/86  Pulse: 83   Resp: 18   SpO2: 99%   Weight: 264 lb 6.4 oz (119.9 kg)   Height: 5\' 7"  (1.702 m)    Wt Readings from Last 3 Encounters:  12/02/19 264 lb 6.4 oz (119.9 kg)  11/15/19 262 lb 14.4 oz (119.3 kg)  03/01/17 280 lb (127 kg)  Lab Results  Component Value Date   CREATININE 0.81 11/15/2019   CREATININE 0.75 11/14/2019   CREATININE 0.80 11/13/2019     Physical Exam Vitals and nursing note reviewed.  Constitutional:      Appearance: Normal appearance.  HENT:     Head: Normocephalic and atraumatic.  Cardiovascular:     Rate and Rhythm: Normal rate and regular rhythm.  Pulmonary:     Effort: Pulmonary effort is normal. No respiratory distress.     Breath sounds: No  wheezing or rales.  Abdominal:     General: There is no distension.     Palpations: Abdomen is soft.  Musculoskeletal:        General: Swelling (right hand) present. No tenderness.     Cervical back: Normal range of motion and neck supple.     Right lower leg: No edema.     Left lower leg: No edema.  Skin:    General: Skin is warm and dry.  Neurological:     General: No focal deficit present.     Mental Status: She is alert and oriented to person, place, and time.     Comments: Equal bilateral radial pulses; both hands/ lower arms equally warm  Psychiatric:        Mood and Affect: Mood normal.        Behavior: Behavior normal.        Thought Content: Thought content normal.      Assessment & Plan:  1: Chronic heart failure with preserved ejection fraction- - NYHA class II - euvolemic today - doesn't have scales so a set was given to her today; instructed to weigh daily, write the weight down and call for an overnight weight gain of >2 pounds or a weekly weight gain of >5 pounds - not adding salt and patient and son have been reading food labels for sodium content; reviewed the importance of following a 2000mg  sodium diet and written dietary information was given to her about this - BNP 11/13/2019 was 823.0  2: HTN- - BP elevated but she hadn't taken any of her medications yet this morning; patient took her morning medications while in the office and rechecked BP ~ 1/2 hour later was coming down - information given to her about [facility] as she doesn't have any medical insurance and lives in [place] - also gave her an application for [employer] - BMP from 11/15/2019 reviewed and showed sodium 140, potassium 3.7, creatinine 0.81 and GFR >60  3: DM- - A1c 11/13/2019 was 7.9% - fasting glucose in clinic today was 157  4: Hand swelling- - patient says that she's had tendonitis in that wrist/hand before & has been taking ibuprofen for the  pain - advised to limit ibuprofen as it can contribute to HTN - elevate the arm on a pillow when sitting - can also get compression sleeve for the wrist  - equal radial pulses and both extremities warm so not currently concerned about vascular involvement - did make an appointment with orthopaedic but they require $250 up front since she doesn't have insurance and she's not sure she wants to pay that; son is going to call and R/S and give the above therapy time to work - work note given to keep her out for 2 weeks until I see her again  Medication bottles were reviewed.   Return in 2 weeks or sooner for any questions/problems before then.

## 2019-12-02 ENCOUNTER — Encounter: Payer: Self-pay | Admitting: Family

## 2019-12-02 ENCOUNTER — Ambulatory Visit: Payer: Self-pay | Attending: Family | Admitting: Family

## 2019-12-02 ENCOUNTER — Other Ambulatory Visit: Payer: Self-pay

## 2019-12-02 VITALS — BP 220/86 | HR 83 | Resp 18 | Ht 67.0 in | Wt 264.4 lb

## 2019-12-02 DIAGNOSIS — I10 Essential (primary) hypertension: Secondary | ICD-10-CM

## 2019-12-02 DIAGNOSIS — E119 Type 2 diabetes mellitus without complications: Secondary | ICD-10-CM | POA: Insufficient documentation

## 2019-12-02 DIAGNOSIS — Z7984 Long term (current) use of oral hypoglycemic drugs: Secondary | ICD-10-CM | POA: Insufficient documentation

## 2019-12-02 DIAGNOSIS — I110 Hypertensive heart disease with heart failure: Secondary | ICD-10-CM | POA: Insufficient documentation

## 2019-12-02 DIAGNOSIS — I5032 Chronic diastolic (congestive) heart failure: Secondary | ICD-10-CM | POA: Insufficient documentation

## 2019-12-02 DIAGNOSIS — Z79899 Other long term (current) drug therapy: Secondary | ICD-10-CM | POA: Insufficient documentation

## 2019-12-02 DIAGNOSIS — M7989 Other specified soft tissue disorders: Secondary | ICD-10-CM | POA: Insufficient documentation

## 2019-12-02 LAB — GLUCOSE, CAPILLARY: Glucose-Capillary: 157 mg/dL — ABNORMAL HIGH (ref 70–99)

## 2019-12-02 NOTE — Patient Instructions (Addendum)
Begin weighing daily and call for an overnight weight gain of > 2 pounds or a weekly weight gain of >5 pounds.   Call [place] at [phone] [address] [phone]  Elevate right arm on a pillow and get compression sleeve for the wrist/hand area.

## 2019-12-15 ENCOUNTER — Encounter: Payer: Self-pay | Admitting: Emergency Medicine

## 2019-12-15 ENCOUNTER — Emergency Department
Admission: EM | Admit: 2019-12-15 | Discharge: 2019-12-15 | Disposition: A | Discharge status: Home / Self Care | Payer: Self-pay | Attending: Emergency Medicine | Admitting: Emergency Medicine

## 2019-12-15 ENCOUNTER — Encounter: Payer: Self-pay | Admitting: Family

## 2019-12-15 ENCOUNTER — Other Ambulatory Visit: Payer: Self-pay

## 2019-12-15 ENCOUNTER — Ambulatory Visit: Payer: Self-pay | Attending: Family | Admitting: Family

## 2019-12-15 VITALS — BP 220/100 | HR 67 | Resp 20 | Ht 67.0 in | Wt 265.0 lb

## 2019-12-15 DIAGNOSIS — I110 Hypertensive heart disease with heart failure: Secondary | ICD-10-CM | POA: Insufficient documentation

## 2019-12-15 DIAGNOSIS — E119 Type 2 diabetes mellitus without complications: Secondary | ICD-10-CM | POA: Insufficient documentation

## 2019-12-15 DIAGNOSIS — I5032 Chronic diastolic (congestive) heart failure: Secondary | ICD-10-CM | POA: Insufficient documentation

## 2019-12-15 DIAGNOSIS — Z7984 Long term (current) use of oral hypoglycemic drugs: Secondary | ICD-10-CM | POA: Insufficient documentation

## 2019-12-15 DIAGNOSIS — Z79899 Other long term (current) drug therapy: Secondary | ICD-10-CM | POA: Insufficient documentation

## 2019-12-15 DIAGNOSIS — I509 Heart failure, unspecified: Secondary | ICD-10-CM | POA: Insufficient documentation

## 2019-12-15 DIAGNOSIS — M79601 Pain in right arm: Secondary | ICD-10-CM | POA: Insufficient documentation

## 2019-12-15 DIAGNOSIS — I10 Essential (primary) hypertension: Secondary | ICD-10-CM

## 2019-12-15 DIAGNOSIS — M7989 Other specified soft tissue disorders: Secondary | ICD-10-CM | POA: Insufficient documentation

## 2019-12-15 LAB — CBC
HCT: 37.3 % (ref 36.0–46.0)
Hemoglobin: 11 g/dL — ABNORMAL LOW (ref 12.0–15.0)
MCH: 22.1 pg — ABNORMAL LOW (ref 26.0–34.0)
MCHC: 29.5 g/dL — ABNORMAL LOW (ref 30.0–36.0)
MCV: 75.1 fL — ABNORMAL LOW (ref 80.0–100.0)
Platelets: 255 10*3/uL (ref 150–400)
RBC: 4.97 MIL/uL (ref 3.87–5.11)
RDW: 21.3 % — ABNORMAL HIGH (ref 11.5–15.5)
WBC: 6.1 10*3/uL (ref 4.0–10.5)
nRBC: 0 % (ref 0.0–0.2)

## 2019-12-15 LAB — TROPONIN I (HIGH SENSITIVITY)
Troponin I (High Sensitivity): 37 ng/L — ABNORMAL HIGH (ref <18)
Troponin I (High Sensitivity): 35 ng/L — ABNORMAL HIGH (ref <18)

## 2019-12-15 LAB — BASIC METABOLIC PANEL
Anion gap: 10 (ref 5–15)
BUN: 17 mg/dL (ref 6–20)
CO2: 25 mmol/L (ref 22–32)
Calcium: 9.7 mg/dL (ref 8.9–10.3)
Chloride: 102 mmol/L (ref 98–111)
Creatinine, Ser: 0.9 mg/dL (ref 0.44–1.00)
GFR calc Af Amer: >60 mL/min (ref >60)
GFR calc non Af Amer: >60 mL/min (ref >60)
Glucose, Bld: 169 mg/dL — ABNORMAL HIGH (ref 70–99)
Potassium: 3.8 mmol/L (ref 3.5–5.1)
Sodium: 137 mmol/L (ref 135–145)

## 2019-12-15 LAB — BRAIN NATRIURETIC PEPTIDE: B Natriuretic Peptide: 112 pg/mL — ABNORMAL HIGH (ref 0.0–100.0)

## 2019-12-15 LAB — GLUCOSE, CAPILLARY: Glucose-Capillary: 126 mg/dL — ABNORMAL HIGH (ref 70–99)

## 2019-12-15 MED ORDER — LISINOPRIL 10 MG PO TABS
20.0000 mg | ORAL_TABLET | Freq: Once | ORAL | Status: AC
  Administered 2019-12-15: 13:00:00 20 mg via ORAL
  Filled 2019-12-15: qty 2

## 2019-12-15 MED ORDER — HYDRALAZINE HCL 25 MG PO TABS: 25.0000 mg | ORAL_TABLET | Freq: Three times a day (TID) | ORAL | 0 refills | Status: DC

## 2019-12-15 MED ORDER — HYDRALAZINE HCL 20 MG/ML IJ SOLN
20.0000 mg | Freq: Once | INTRAMUSCULAR | Status: AC
  Administered 2019-12-15: 16:00:00 20 mg via INTRAVENOUS
  Filled 2019-12-15: qty 1

## 2019-12-15 MED ORDER — AMLODIPINE BESYLATE 5 MG PO TABS: 10.0000 mg | ORAL_TABLET | Freq: Every day | ORAL | 0 refills | Status: DC

## 2019-12-15 MED ORDER — AMLODIPINE BESYLATE 5 MG PO TABS
5.0000 mg | ORAL_TABLET | Freq: Once | ORAL | Status: AC
  Administered 2019-12-15: 13:00:00 5 mg via ORAL
  Filled 2019-12-15: qty 1

## 2019-12-15 MED ORDER — OXYCODONE-ACETAMINOPHEN 5-325 MG PO TABS
1.0000 | ORAL_TABLET | Freq: Once | ORAL | Status: AC
  Administered 2019-12-15: 1 via ORAL
  Filled 2019-12-15: qty 1

## 2019-12-15 MED ORDER — LISINOPRIL 40 MG PO TABS: 40.0000 mg | ORAL_TABLET | Freq: Every day | ORAL | 1 refills | Status: DC

## 2019-12-15 MED ORDER — HYDRALAZINE HCL 20 MG/ML IJ SOLN
10.0000 mg | Freq: Once | INTRAMUSCULAR | Status: AC
  Administered 2019-12-15: 14:00:00 10 mg via INTRAVENOUS
  Filled 2019-12-15: qty 1

## 2019-12-15 NOTE — Patient Instructions (Signed)
Continue weighing daily and call for an overnight weight gain of > 2 pounds or a weekly weight gain of >5 pounds. 

## 2019-12-15 NOTE — ED Provider Notes (Addendum)
Procedures     ----------------------------------------- 3:07 PM on 12/15/2019 -----------------------------------------   Remains asymptomatic, feeling well.  At last check blood pressure is 215/90.  This is acceptable improvement for now.  Dr. Peter Minium has written prescriptions for antihypertensives, I had a again encouraged her to take these medicines, warned against the dangers of long-term uncontrolled hypertension.  Answered all questions.  She is stable for discharge at this point.   Sharman Cheek, MD 12/15/19 1508  ----------------------------------------- 4:18 PM on 12/15/2019 -----------------------------------------  Blood pressure remained severely elevated, so patient was given a second dose of IV hydralazine 20 mg along with Percocet for her wrist pain.  This did improve her blood pressure to 200/90.  Seeing this kind of resistance of her blood pressure, I suspect she will require a third agent in addition to the lisinopril and amlodipine that has already been prescribed.  Adding on hydralazine p.o.    Sharman Cheek, MD 12/15/19 [phone]

## 2019-12-15 NOTE — ED Provider Notes (Signed)
[facility] Department Provider Note   ____________________________________________    I have reviewed the triage vital signs and the nursing notes.   HISTORY  Chief Complaint Hypertension     HPI [name] is a 54 y.o. female with a history of CHF, diabetes, hypertension who was sent over from cardiologist office for elevated blood pressure.  Patient reports he feels quite well and has no complaints.  Denies shortness of breath or chest pain.  No palpitations.  Reports compliance with her medications, she does not remember if they were.  Per medical record she takes lisinopril, amlodipine.  She denies swelling in her legs.  No fevers or chills or cough.  Past Medical History:  Diagnosis Date  . CHF (congestive heart failure) (HCC)   . Diabetes mellitus without complication (HCC)   . Hypertension     Patient Active Problem List   Diagnosis Date Noted  . Uncontrolled type 2 diabetes mellitus with hyperglycemia (HCC)   . Acute diastolic CHF (congestive heart failure) (HCC)   . Obesity, Class III, BMI 40-49.9 (morbid obesity) (HCC)   . Hypertensive urgency 11/13/2019    History reviewed. No pertinent surgical history.  Prior to Admission medications   Medication Sig Start Date End Date Taking? Authorizing Provider  carvedilol (COREG) 6.25 MG tablet Take 1 tablet (6.25 mg total) by mouth 2 (two) times daily with a meal. 11/15/19  Yes Wieting, Richard, MD  furosemide (LASIX) 40 MG tablet Take 1 tablet (40 mg total) by mouth daily. 11/15/19  Yes Alford Highland, MD  metFORMIN (GLUCOPHAGE) 500 MG tablet Take 1 tablet (500 mg total) by mouth 2 (two) times daily with a meal. 11/15/19  Yes Wieting, Richard, MD  amLODipine (NORVASC) 5 MG tablet Take 2 tablets (10 mg total) by mouth daily. 12/15/19   Jene Every, MD  lisinopril (ZESTRIL) 40 MG tablet Take 1 tablet (40 mg total) by mouth daily. 12/15/19   Jene Every, MD      Allergies Patient has no known allergies.  No family history on file.  Social History Social History   Tobacco Use  . Smoking status: Never Smoker  . Smokeless tobacco: Never Used  Substance Use Topics  . Alcohol use: No  . Drug use: No    Review of Systems  Constitutional: No fever/chills Eyes: No visual changes.  ENT: No sore throat. Cardiovascular: As above Respiratory: As above Gastrointestinal: No abdominal pain.  No nausea, no vomiting.   Genitourinary: Negative for dysuria. Musculoskeletal: Negative for back pain. Skin: Negative for rash. Neurological: Negative for headaches    ____________________________________________   PHYSICAL EXAM:  VITAL SIGNS: ED Triage Vitals  Enc Vitals Group     BP 12/15/19 0936 (!) 234/108     Pulse Rate 12/15/19 0936 72     Resp 12/15/19 0936 18     Temp 12/15/19 0936 98.5 F (36.9 C)     Temp Source 12/15/19 0936 Oral     SpO2 12/15/19 0936 99 %     Weight 12/15/19 0936 120.2 kg (265 lb)     Height 12/15/19 0936 1.702 m (5\' 7" )     Head Circumference --      Peak Flow --      Pain Score 12/15/19 0943 0     Pain Loc --      Pain Edu? --      Excl. in GC? --     Constitutional: Alert and oriented.  Pleasant, well-appearing  Nose: No congestion/rhinnorhea. Mouth/Throat: Mucous membranes are moist.   Cardiovascular: Normal rate, regular rhythm. Good peripheral circulation. Respiratory: Normal respiratory effort.  No retractions.  Clear auscultation bilaterally Gastrointestinal: Soft and nontender. No distention.  Musculoskeletal: No lower extremity tenderness nor edema.  Warm and well perfused Neurologic:  Normal speech and language. No gross focal neurologic deficits are appreciated.  Skin:  Skin is warm, dry and intact. No rash noted. Psychiatric: Mood and affect are normal. Speech and behavior are normal.  ____________________________________________   LABS (all labs ordered are listed, but only abnormal  results are displayed)  Labs Reviewed  BASIC METABOLIC PANEL - Abnormal; Notable for the following components:      Result Value   Glucose, Bld 169 (*)    All other components within normal limits  CBC - Abnormal; Notable for the following components:   Hemoglobin 11.0 (*)    MCV 75.1 (*)    MCH 22.1 (*)    MCHC 29.5 (*)    RDW 21.3 (*)    All other components within normal limits  BRAIN NATRIURETIC PEPTIDE - Abnormal; Notable for the following components:   B Natriuretic Peptide 112.0 (*)    All other components within normal limits  TROPONIN I (HIGH SENSITIVITY) - Abnormal; Notable for the following components:   Troponin I (High Sensitivity) 37 (*)    All other components within normal limits  TROPONIN I (HIGH SENSITIVITY) - Abnormal; Notable for the following components:   Troponin I (High Sensitivity) 35 (*)    All other components within normal limits   ____________________________________________  EKG  ED ECG REPORT I, Jene Every, the attending physician, personally viewed and interpreted this ECG.  Date: 12/15/2019  Rhythm: normal sinus rhythm QRS Axis: normal Intervals: normal ST/T Wave abnormalities: T wave inversions inferiorly Narrative Interpretation: no evidence of acute ischemia  ____________________________________________  RADIOLOGY  None ____________________________________________   PROCEDURES  Procedure(s) performed: No  Procedures   Critical Care performed: No ____________________________________________   INITIAL IMPRESSION / ASSESSMENT AND PLAN / ED COURSE  Pertinent labs & imaging results that were available during my care of the patient were reviewed by me and considered in my medical decision making (see chart for details).  Patient presents with elevated blood pressure, initial triage blood pressure with systolic of 234/108 however the patient is well-appearing and has no complaints.  EKG demonstrates T wave inversions,  these appear new from 6 months ago but no more recent records.  Troponin is elevated at 37 initially, second troponin is 35, looking back at troponins in the past this appears to be a chronic elevation.  We will give additional dose of her home blood pressure medications and reevaluate her blood pressure.    Discussed with patient, she does not want to stay in the hospital for her elevated blood pressure, she wants to increase her blood pressure medication at home and see if this brings her blood pressure down.  She does state that she will come back if any change in her status.  She states she feels too well to be in the hospital.  I think this is a reasonable plan is looking back in her history she has chronically elevated difficult to control blood pressures which are not significantly changed today.    ____________________________________________   FINAL CLINICAL IMPRESSION(S) / ED DIAGNOSES  Final diagnoses:  Essential hypertension        Note:  This document was prepared using Dragon voice recognition software and may include  unintentional dictation errors.   Lavonia Drafts, MD 12/15/19 1313

## 2019-12-15 NOTE — Discharge Instructions (Signed)
We have doubled your blood pressure medication doses, please start taking the new doses ASAP.  Follow-up with CHF clinic to see if your blood pressure is improving.  Return to the ED if any chest pain shortness of breath palpitations or other concerns

## 2019-12-15 NOTE — ED Notes (Signed)
Took BP medication this morning. Went to [facility] this am for appointment and they took BP. BP was high and they sent her here. States no symptoms. Denies: CP, dizziness, blurred vision.

## 2019-12-15 NOTE — ED Notes (Signed)
ED Provider at bedside. 

## 2019-12-15 NOTE — ED Triage Notes (Signed)
Pt reports went to the CHF clinic this am and her BP was elevated so they sent her here. Pt denies all symptoms and reports she took her meds this am.

## 2019-12-15 NOTE — Progress Notes (Signed)
Patient ID: [name], female    DOB: January 20, 1966, 54 y.o.   MRN: [id number]  HPI  [name] is a 54 y/o female with a history of DM, HTN and chronic heart failure.   Echo report from 11/13/2019 reviewed and showed an EF of 55-60% along with trivial MR.   Admitted 11/13/2019 due to HTN and acute on chronic heart failure. Initially given IV lasix and then transitioned to oral diuretics. Started on antihypertensives. Discharged after 2 days.   She presents today for a follow-up visit with a chief complaint of minimal fatigue upon moderate exertion. She describes this as chronic in nature having been present for several years. She has associated right arm pain (constant) and difficulty sleeping due to arm pain. She denies any dizziness, headache, abdominal distention, palpitations, pedal edema, chest pain, shortness of breath, cough, visual disturbances or weight gain.  Has an appointment scheduled with [facility] next week to establish for primary care. Has a BP cuff at home but has to get batteries for it.   She says that she's taken all her medications this morning and hasn't missed any doses. Not adding salt to her food.   Biggest complaint is of her continued right arm pain. Does have it wrapped in an ACE bandage with some improvement in the swelling. Has been propping her arm up during the day.    Past Medical History:  Diagnosis Date  . CHF (congestive heart failure) ([place])   . Diabetes mellitus without complication ([place])   . Hypertension    No past surgical history on file. No family history on file. Social History   Tobacco Use  . Smoking status: Never Smoker  . Smokeless tobacco: Never Used  Substance Use Topics  . Alcohol use: No   No Known Allergies  Prior to Admission medications   Medication Sig Start Date End Date Taking? Authorizing Provider  amLODipine (NORVASC) 5 MG tablet Take 1 tablet (5 mg total) by mouth daily. 11/16/19  Yes  Loletha Grayer, MD  carvedilol (COREG) 6.25 MG tablet Take 1 tablet (6.25 mg total) by mouth 2 (two) times daily with a meal. 11/15/19  Yes Wieting, Richard, MD  furosemide (LASIX) 40 MG tablet Take 1 tablet (40 mg total) by mouth daily. 11/15/19  Yes Wieting, Richard, MD  lisinopril (ZESTRIL) 20 MG tablet Take 1 tablet (20 mg total) by mouth daily. 11/16/19  Yes Loletha Grayer, MD  metFORMIN (GLUCOPHAGE) 500 MG tablet Take 1 tablet (500 mg total) by mouth 2 (two) times daily with a meal. 11/15/19  Yes Loletha Grayer, MD    Review of Systems  Constitutional: Positive for fatigue. Negative for appetite change.  HENT: Positive for postnasal drip. Negative for congestion and sore throat.   Eyes: Negative.  Negative for visual disturbance.  Respiratory: Negative for cough, chest tightness and shortness of breath.   Cardiovascular: Negative for chest pain, palpitations and leg swelling.  Gastrointestinal: Negative for abdominal distention and abdominal pain.  Endocrine: Negative.   Genitourinary: Negative.   Musculoskeletal: Positive for arthralgias (right arm w/ swelling).  Skin: Negative.   Allergic/Immunologic: Negative.   Neurological: Negative for dizziness, light-headedness and numbness.  Hematological: Negative for adenopathy. Does not bruise/bleed easily.  Psychiatric/Behavioral: Positive for sleep disturbance (due to arm pain). Negative for dysphoric mood. The patient is not nervous/anxious.    Vitals:   12/15/19 0832 12/15/19 0848  BP: (!) 218/104 (!) 220/100  Pulse: 67   Resp: 20  SpO2: 100%   Weight: 265 lb (120.2 kg)   Height: 5\' 7"  (1.702 m)    Wt Readings from Last 3 Encounters:  12/15/19 265 lb (120.2 kg)  12/02/19 264 lb 6.4 oz (119.9 kg)  11/15/19 262 lb 14.4 oz (119.3 kg)   Lab Results  Component Value Date   CREATININE 0.81 11/15/2019   CREATININE 0.75 11/14/2019   CREATININE 0.80 11/13/2019    Physical Exam Vitals and nursing note reviewed.   Constitutional:      Appearance: Normal appearance.  HENT:     Head: Normocephalic and atraumatic.  Cardiovascular:     Rate and Rhythm: Normal rate and regular rhythm.  Pulmonary:     Effort: Pulmonary effort is normal. No respiratory distress.     Breath sounds: No wheezing or rales.  Abdominal:     General: There is no distension.     Palpations: Abdomen is soft.  Musculoskeletal:        General: Swelling (right hand although less) present. No tenderness.     Cervical back: Normal range of motion and neck supple.     Right lower leg: No edema.     Left lower leg: No edema.  Skin:    General: Skin is warm and dry.  Neurological:     General: No focal deficit present.     Mental Status: She is alert and oriented to person, place, and time.     Comments: Equal bilateral radial pulses; both hands/ lower arms equally warm; decreased grasp in right hand  Psychiatric:        Mood and Affect: Mood normal.        Behavior: Behavior normal.        Thought Content: Thought content normal.     Assessment & Plan:  1: Chronic heart failure with preserved ejection fraction- - NYHA class II - euvolemic today - weighing daily; reminded to call for an overnight weight gain of >2 pounds or a weekly weight gain of >5 pounds - weight stable from last visit here 2 weeks ago - not adding salt and patient and son have been reading food labels for sodium content; reviewed the importance of following a 2000mg  sodium diet  - BNP 11/13/2019 was 823.0  2: HTN- - BP markedly elevated even upon recheck (220/100); was elevated at last visit but she hadn't taken any of her medications  - patient has taken all her medications this morning and says that she hasn't missed any of her doses - says that she has an appointment next week to get established with [facility]  - BMP from 11/15/2019 reviewed and showed sodium 140, potassium 3.7, creatinine 0.81 and GFR >60  3: DM- -  A1c 11/13/2019 was 7.9% - fasting glucose in clinic today was 126  4: Hand swelling- - patient says that she's had tendonitis in that wrist/hand before  - advised to limit ibuprofen as it can contribute to HTN - has been elevating her arm when sitting and swelling appears to be less - has right lower arm wrapped in kerlex - equal radial pulses and both extremities warm so not currently concerned about vascular involvement  Patient did not bring her medications nor a list. Each medication was verbally reviewed with the patient and she was encouraged to bring the bottles to every visit to confirm accuracy of list.  Based on extremely high BP even after rechecking after she's been sitting for 15 minutes, feel it's best  for patient to go to the ED to have her BP treated. Explained that even though she doesn't feel bad or have headache, visual issues etc, she is at an extremely high risk of a stroke.   Called ED triage nurse to advise of patient coming. Patient was taken to the ED in a wheelchair by a volunteer.

## 2019-12-17 ENCOUNTER — Telehealth: Payer: Self-pay | Admitting: Family

## 2019-12-17 NOTE — Telephone Encounter (Signed)
LVM with patient regarding her recent ED visit immediately following her f/u appt with [place] in [facility]. Per Inetta Fermo, LVM reminding her of her hydralazine prescription to have filled as well as reminder to get batteries for her BP cuff at home and to document daily. Also told her to call us if needed.     [name], [place]

## 2019-12-23 ENCOUNTER — Ambulatory Visit: Payer: Self-pay | Admitting: Family

## 2019-12-27 NOTE — Progress Notes (Signed)
Patient ID: Ande Therrell, female    DOB: [birth year], 54 y.o.   MRN: [id number]  HPI  Ms Talsma is a 54 y/o female with a history of DM, HTN and chronic heart failure.   Echo report from 11/13/2019 reviewed and showed an EF of 55-60% along with trivial MR.   Was in the ED 12/15/19 due to HTN. Chronic elevated troponin. Given IV hydralazine with slight improvement in BP. RX for oral hydralazine given and she was released. Admitted 11/13/2019 due to HTN and acute on chronic heart failure. Initially given IV lasix and then transitioned to oral diuretics. Started on antihypertensives. Discharged after 2 days.   She presents today for a follow-up visit with a chief complaint of minimal fatigue upon moderate exertion. She describes this as chronic in nature having been present for several years. She has associated difficulty sleeping and right arm pain along with this. She denies any dizziness, abdominal distention, palpitations, pedal edema, chest pain, shortness of breath, cough or weight gain.   Has seen PCP at [facility] and was given hydralazine that she's been taking BID. She takes her BP at home with a wrist monitor 3-4 times/ day and the top number ranges from 130's-190's.   Past Medical History:  Diagnosis Date  . CHF (congestive heart failure) ([place])   . Diabetes mellitus without complication ([place])   . Hypertension    No past surgical history on file. No family history on file. Social History   Tobacco Use  . Smoking status: Never Smoker  . Smokeless tobacco: Never Used  Substance Use Topics  . Alcohol use: No   No Known Allergies   Prior to Admission medications   Medication Sig Start Date End Date Taking? Authorizing Provider  amLODipine (NORVASC) 5 MG tablet Take 2 tablets (10 mg total) by mouth daily. 12/15/19  Yes Lavonia Drafts, MD  carvedilol (COREG) 6.25 MG tablet Take 1 tablet (6.25 mg total) by mouth 2 (two) times daily with a meal. 11/15/19  Yes Wieting,  Richard, MD  furosemide (LASIX) 40 MG tablet Take 1 tablet (40 mg total) by mouth daily. 11/15/19  Yes Wieting, Richard, MD  hydrALAZINE (APRESOLINE) 25 MG tablet Take 1 tablet (25 mg total) by mouth 3 (three) times daily. Patient taking differently: Take 25 mg by mouth 2 (two) times daily.  12/15/19 01/14/20 Yes Carrie Mew, MD  lisinopril (ZESTRIL) 40 MG tablet Take 1 tablet (40 mg total) by mouth daily. 12/15/19  Yes Lavonia Drafts, MD  metFORMIN (GLUCOPHAGE) 500 MG tablet Take 1 tablet (500 mg total) by mouth 2 (two) times daily with a meal. 11/15/19  Yes Loletha Grayer, MD    Review of Systems  Constitutional: Positive for fatigue. Negative for appetite change.  HENT: Positive for postnasal drip. Negative for congestion and sore throat.   Eyes: Negative.  Negative for visual disturbance.  Respiratory: Negative for cough, chest tightness and shortness of breath.   Cardiovascular: Negative for chest pain, palpitations and leg swelling.  Gastrointestinal: Negative for abdominal distention and abdominal pain.  Endocrine: Negative.   Genitourinary: Negative.   Musculoskeletal: Positive for arthralgias (right arm w/ swelling).  Skin: Negative.   Allergic/Immunologic: Negative.   Neurological: Negative for dizziness, light-headedness and numbness.  Hematological: Negative for adenopathy. Does not bruise/bleed easily.  Psychiatric/Behavioral: Positive for sleep disturbance (due to arm pain). Negative for dysphoric mood. The patient is not nervous/anxious.    Vitals:   12/28/19 1237  BP: (!) 221/75  Pulse: Marland Kitchen)  55  Resp: 18  SpO2: 97%  Weight: 264 lb (119.7 kg)  Height: 5\' 7"  (1.702 m)   Wt Readings from Last 3 Encounters:  12/28/19 264 lb (119.7 kg)  12/15/19 265 lb (120.2 kg)  12/15/19 265 lb (120.2 kg)   Lab Results  Component Value Date   CREATININE 0.90 12/15/2019   CREATININE 0.81 11/15/2019   CREATININE 0.75 11/14/2019    Physical Exam Vitals and nursing note  reviewed.  Constitutional:      Appearance: Normal appearance.  HENT:     Head: Normocephalic and atraumatic.  Cardiovascular:     Rate and Rhythm: Normal rate and regular rhythm.  Pulmonary:     Effort: Pulmonary effort is normal. No respiratory distress.     Breath sounds: No wheezing or rales.  Abdominal:     General: There is no distension.     Palpations: Abdomen is soft.  Musculoskeletal:        General: Swelling (right hand ) present. No tenderness.     Cervical back: Normal range of motion and neck supple.     Right lower leg: No edema.     Left lower leg: No edema.  Skin:    General: Skin is warm and dry.  Neurological:     General: No focal deficit present.     Mental Status: She is alert and oriented to person, place, and time.  Psychiatric:        Mood and Affect: Mood normal.        Behavior: Behavior normal.        Thought Content: Thought content normal.     Assessment & Plan:  1: Chronic heart failure with preserved ejection fraction- - NYHA class II - euvolemic today - weighing daily; reminded to call for an overnight weight gain of >2 pounds or a weekly weight gain of >5 pounds - weight stable from last visit here 2 weeks ago - not adding salt and patient and son have been reading food labels for sodium content; reviewed the importance of following a 2000mg  sodium diet  - BNP 12/15/19 was 112.0  2: HTN- - BP remains elevated; patient asymptomatic - has seen PCP at [facility] and is now taking hydralazine BID; will increase this to TID - instructed to check BP 2x/ day and vary the times of day that she checks the BP - has f/u with PCP in ~ 1 month - BMP from 12/15/19 reviewed and showed sodium 137, potassium 3.8, creatinine 0.90 and GFR >60  3: DM- - A1c 11/13/2019 was 7.9% - fasting glucose in clinic today was 137  4: Hand swelling- - patient says that she's had tendonitis in that wrist/hand before  - advised to limit  ibuprofen as it can contribute to HTN - has right lower arm wrapped in kerlex - patient says that her PCP placed her on a cream to apply but she hasn't picked it up yet  Patient brought 3 of her bottles but not everything. Emphasized that she needed to bring all her bottles to every visit.   Return in 6 weeks or sooner for any questions/problems before then.

## 2019-12-28 ENCOUNTER — Ambulatory Visit: Payer: Self-pay | Attending: Family | Admitting: Family

## 2019-12-28 ENCOUNTER — Encounter: Payer: Self-pay | Admitting: Family

## 2019-12-28 ENCOUNTER — Other Ambulatory Visit: Payer: Self-pay

## 2019-12-28 VITALS — BP 221/75 | HR 55 | Resp 18 | Ht 67.0 in | Wt 264.0 lb

## 2019-12-28 DIAGNOSIS — E119 Type 2 diabetes mellitus without complications: Secondary | ICD-10-CM | POA: Insufficient documentation

## 2019-12-28 DIAGNOSIS — I10 Essential (primary) hypertension: Secondary | ICD-10-CM

## 2019-12-28 DIAGNOSIS — I110 Hypertensive heart disease with heart failure: Secondary | ICD-10-CM | POA: Insufficient documentation

## 2019-12-28 DIAGNOSIS — I5032 Chronic diastolic (congestive) heart failure: Secondary | ICD-10-CM | POA: Insufficient documentation

## 2019-12-28 DIAGNOSIS — Z7984 Long term (current) use of oral hypoglycemic drugs: Secondary | ICD-10-CM | POA: Insufficient documentation

## 2019-12-28 DIAGNOSIS — M7989 Other specified soft tissue disorders: Secondary | ICD-10-CM | POA: Insufficient documentation

## 2019-12-28 DIAGNOSIS — Z79899 Other long term (current) drug therapy: Secondary | ICD-10-CM | POA: Insufficient documentation

## 2019-12-28 LAB — GLUCOSE, CAPILLARY: Glucose-Capillary: 137 mg/dL — ABNORMAL HIGH (ref 70–99)

## 2019-12-28 NOTE — Patient Instructions (Addendum)
Continue weighing daily and call for an overnight weight gain of > 2 pounds or a weekly weight gain of >5 pounds.  Increase hydralazine to three times daily. Take at 8am, 4pm and then at bedtime

## 2020-01-31 NOTE — Progress Notes (Signed)
Patient ID: [name], female    DOB: 04/24/1966, 54 y.o.   MRN: [id number]  HPI  [name] is a 54 y/o female with a history of DM, HTN and chronic heart failure.   Echo report from 11/13/2019 reviewed and showed an EF of 55-60% along with trivial MR.   Was in the ED 12/15/19 due to HTN. Chronic elevated troponin. Given IV hydralazine with slight improvement in BP. RX for oral hydralazine given and she was released. Admitted 11/13/2019 due to HTN and acute on chronic heart failure. Initially given IV lasix and then transitioned to oral diuretics. Started on antihypertensives. Discharged after 2 days.   She presents today for a follow-up visit with a chief complaint of minimal fatigue upon moderate exertion. She describes this as chronic in nature having been present for several years. She has associated postnasal drip along with this. She denies any difficulty sleeping, dizziness, abdominal distention, palpitations, pedal edema, chest pain, shortness of breath, cough or fatigue.   Has recently had her hydralazine increased by her PCP. Swelling is completely gone in her right hand now.   Past Medical History:  Diagnosis Date  . CHF (congestive heart failure) ([place])   . Diabetes mellitus without complication ([place])   . Hypertension     Social History   Tobacco Use  . Smoking status: Never Smoker  . Smokeless tobacco: Never Used  Substance Use Topics  . Alcohol use: No   No Known Allergies  Prior to Admission medications   Medication Sig Start Date End Date Taking? Authorizing Provider  amLODipine (NORVASC) 5 MG tablet Take 2 tablets (10 mg total) by mouth daily. 12/15/19  Yes Lavonia Drafts, MD  carvedilol (COREG) 6.25 MG tablet Take 1 tablet (6.25 mg total) by mouth 2 (two) times daily with a meal. 11/15/19  Yes Wieting, Richard, MD  furosemide (LASIX) 40 MG tablet Take 1 tablet (40 mg total) by mouth daily. 11/15/19  Yes Wieting, Richard, MD  hydrALAZINE (APRESOLINE) 25  MG tablet Take 1 tablet (25 mg total) by mouth 3 (three) times daily. Patient taking differently: Take 50 mg by mouth 3 (three) times daily.  12/15/19 02/01/20 Yes Carrie Mew, MD  lisinopril (ZESTRIL) 40 MG tablet Take 1 tablet (40 mg total) by mouth daily. 12/15/19  Yes Lavonia Drafts, MD  metFORMIN (GLUCOPHAGE) 500 MG tablet Take 1 tablet (500 mg total) by mouth 2 (two) times daily with a meal. 11/15/19  Yes Loletha Grayer, MD     Review of Systems  Constitutional: Positive for fatigue. Negative for appetite change.  HENT: Positive for postnasal drip. Negative for congestion and sore throat.   Eyes: Negative.  Negative for visual disturbance.  Respiratory: Negative for cough, chest tightness and shortness of breath.   Cardiovascular: Negative for chest pain, palpitations and leg swelling.  Gastrointestinal: Negative for abdominal distention and abdominal pain.  Endocrine: Negative.   Genitourinary: Negative.   Musculoskeletal: Negative for arthralgias.  Skin: Negative.   Allergic/Immunologic: Negative.   Neurological: Negative for dizziness, light-headedness and numbness.  Hematological: Negative for adenopathy. Does not bruise/bleed easily.  Psychiatric/Behavioral: Negative for dysphoric mood and sleep disturbance ("better"). The patient is not nervous/anxious.    Vitals:   02/01/20 1233  BP: (!) 197/80  Pulse: (!) 50  Resp: 18  SpO2: 100%  Weight: 262 lb 9.6 oz (119.1 kg)  Height: 5\' 4"  (1.626 m)   Wt Readings from Last 3 Encounters:  02/01/20 262 lb 9.6 oz (119.1 kg)  12/28/19 264 lb (119.7 kg)  12/15/19 265 lb (120.2 kg)   Lab Results  Component Value Date   CREATININE 0.90 12/15/2019   CREATININE 0.81 11/15/2019   CREATININE 0.75 11/14/2019     Physical Exam Vitals and nursing note reviewed.  Constitutional:      Appearance: Normal appearance.  HENT:     Head: Normocephalic and atraumatic.  Cardiovascular:     Rate and Rhythm: Normal rate and regular  rhythm.  Pulmonary:     Effort: Pulmonary effort is normal. No respiratory distress.     Breath sounds: No wheezing or rales.  Abdominal:     General: There is no distension.     Palpations: Abdomen is soft.  Musculoskeletal:        General: No tenderness.     Cervical back: Normal range of motion and neck supple.     Right lower leg: No edema.     Left lower leg: No edema.  Skin:    General: Skin is warm and dry.  Neurological:     General: No focal deficit present.     Mental Status: She is alert and oriented to person, place, and time.  Psychiatric:        Mood and Affect: Mood normal.        Behavior: Behavior normal.        Thought Content: Thought content normal.     Assessment & Plan:  1: Chronic heart failure with preserved ejection fraction- - NYHA class II - euvolemic today - weighing daily; reminded to call for an overnight weight gain of >2 pounds or a weekly weight gain of >5 pounds - weight down 2 pounds from last visit here 1 month ago - not adding salt and patient and son have been reading food labels for sodium content; reviewed the importance of following a 2000mg  sodium diet  - BNP 12/15/19 was 112.0 - has not received her flu vaccine for this season yet; good handwashing encouraged  2: HTN- - BP slightly better from last visit - hydralazine increased to TID at last visit - had f/u with PCP ~ 1 week ago & PCP just increased hydralazine to 50mg  TID (was 25mg  TID) - may need further adjustment - BMP from 12/15/19 reviewed and showed sodium 137, potassium 3.8, creatinine 0.90 and GFR >60  3: DM- - A1c 11/13/2019 was 7.9% - fasting glucose in clinic today was 180  4: Hand swelling- - resolved  Medication list was reviewed.  Patient sees PCP on 02/29/20 so will have patient return here in 2 months or sooner for any questions/problems before then.

## 2020-02-01 ENCOUNTER — Encounter: Payer: Self-pay | Admitting: Family

## 2020-02-01 ENCOUNTER — Ambulatory Visit: Payer: Self-pay | Attending: Family | Admitting: Family

## 2020-02-01 ENCOUNTER — Other Ambulatory Visit: Payer: Self-pay

## 2020-02-01 VITALS — BP 197/80 | HR 50 | Resp 18 | Ht 64.0 in | Wt 262.6 lb

## 2020-02-01 DIAGNOSIS — I10 Essential (primary) hypertension: Secondary | ICD-10-CM

## 2020-02-01 DIAGNOSIS — I5032 Chronic diastolic (congestive) heart failure: Secondary | ICD-10-CM | POA: Insufficient documentation

## 2020-02-01 DIAGNOSIS — R0982 Postnasal drip: Secondary | ICD-10-CM | POA: Insufficient documentation

## 2020-02-01 DIAGNOSIS — R5383 Other fatigue: Secondary | ICD-10-CM | POA: Insufficient documentation

## 2020-02-01 DIAGNOSIS — Z79899 Other long term (current) drug therapy: Secondary | ICD-10-CM | POA: Insufficient documentation

## 2020-02-01 DIAGNOSIS — M7989 Other specified soft tissue disorders: Secondary | ICD-10-CM | POA: Insufficient documentation

## 2020-02-01 DIAGNOSIS — I110 Hypertensive heart disease with heart failure: Secondary | ICD-10-CM | POA: Insufficient documentation

## 2020-02-01 DIAGNOSIS — Z7984 Long term (current) use of oral hypoglycemic drugs: Secondary | ICD-10-CM | POA: Insufficient documentation

## 2020-02-01 DIAGNOSIS — E119 Type 2 diabetes mellitus without complications: Secondary | ICD-10-CM | POA: Insufficient documentation

## 2020-02-01 DIAGNOSIS — Z7901 Long term (current) use of anticoagulants: Secondary | ICD-10-CM | POA: Insufficient documentation

## 2020-02-01 LAB — GLUCOSE, CAPILLARY: Glucose-Capillary: 180 mg/dL — ABNORMAL HIGH (ref 70–99)

## 2020-02-01 NOTE — Patient Instructions (Signed)
Continue weighing daily and call for an overnight weight gain of > 2 pounds or a weekly weight gain of >5 pounds. 

## 2020-02-23 ENCOUNTER — Telehealth: Payer: Self-pay | Admitting: Pharmacy Technician

## 2020-02-23 NOTE — Telephone Encounter (Signed)
Patient failed to provide 2021 proof of income.  No additional medication assistance will be provided by [facility] without the required proof of income documentation.  Patient notified by letter.  [name] J. Joseph Johns Care Manager [facility]   [address] [place], [place]  [address]     This is to inform you that you are no longer eligible to receive medication assistance at [facility].  The reason(s) are:    _____Your total gross monthly household income exceeds 250% of the Federal Poverty Level.   _____Tangible assets (savings, checking, stocks/bonds, pension, retirement, etc.) exceeds our limit  _____You are eligible to receive benefits from Medicaid, [facility] or HIV Medication              Assistance Program _____You are eligible to receive benefits from a Medicare Part "D" plan _____You have prescription insurance  _____You are not an [place] resident __X__Failure to provide all requested proof of income information for 2021.    Medication assistance will resume once all requested financial information has been returned to our clinic.  If you have questions, please contact our clinic at [phone].    Thank you,  [facility] 

## 2020-03-29 ENCOUNTER — Ambulatory Visit: Payer: Self-pay | Admitting: Family

## 2020-04-07 ENCOUNTER — Telehealth: Payer: Self-pay | Admitting: Family

## 2020-04-07 ENCOUNTER — Ambulatory Visit: Payer: Self-pay | Admitting: Family

## 2020-04-07 NOTE — Telephone Encounter (Signed)
Patient did not show for her [facility] appointment on 04/07/20. Will attempt to reschedule.  

## 2020-04-07 NOTE — Progress Notes (Deleted)
Patient ID: [name], female    DOB: May 03, 1966, 54 y.o.   MRN: [id number]  HPI  [name] is a 54 y/o female with a history of DM, HTN and chronic heart failure.   Echo report from 11/13/2019 reviewed and showed an EF of 55-60% along with trivial MR and mild LVH.   Was in the ED 12/15/19 due to HTN. Chronic elevated troponin. Given IV hydralazine with slight improvement in BP. RX for oral hydralazine given and she was released. Admitted 11/13/2019 due to HTN and acute on chronic heart failure. Initially given IV lasix and then transitioned to oral diuretics. Started on antihypertensives. Discharged after 2 days.   She presents today for a follow-up visit with a chief complaint of   Past Medical History:  Diagnosis Date  . CHF (congestive heart failure) ([facility])   . Diabetes mellitus without complication ([facility])   . Hypertension     Social History   Tobacco Use  . Smoking status: Never Smoker  . Smokeless tobacco: Never Used  Substance Use Topics  . Alcohol use: No   No Known Allergies     Review of Systems  Constitutional: Positive for fatigue. Negative for appetite change.  HENT: Positive for postnasal drip. Negative for congestion and sore throat.   Eyes: Negative.  Negative for visual disturbance.  Respiratory: Negative for cough, chest tightness and shortness of breath.   Cardiovascular: Negative for chest pain, palpitations and leg swelling.  Gastrointestinal: Negative for abdominal distention and abdominal pain.  Endocrine: Negative.   Genitourinary: Negative.   Musculoskeletal: Negative for arthralgias.  Skin: Negative.   Allergic/Immunologic: Negative.   Neurological: Negative for dizziness, light-headedness and numbness.  Hematological: Negative for adenopathy. Does not bruise/bleed easily.  Psychiatric/Behavioral: Negative for dysphoric mood and sleep disturbance ("better"). The patient is not nervous/anxious.       Physical Exam Vitals and  nursing note reviewed.  Constitutional:      Appearance: Normal appearance.  HENT:     Head: Normocephalic and atraumatic.  Cardiovascular:     Rate and Rhythm: Normal rate and regular rhythm.  Pulmonary:     Effort: Pulmonary effort is normal. No respiratory distress.     Breath sounds: No wheezing or rales.  Abdominal:     General: There is no distension.     Palpations: Abdomen is soft.  Musculoskeletal:        General: No tenderness.     Cervical back: Normal range of motion and neck supple.     Right lower leg: No edema.     Left lower leg: No edema.  Skin:    General: Skin is warm and dry.  Neurological:     General: No focal deficit present.     Mental Status: She is alert and oriented to person, place, and time.  Psychiatric:        Mood and Affect: Mood normal.        Behavior: Behavior normal.        Thought Content: Thought content normal.     Assessment & Plan:  1: Chronic heart failure with preserved ejection fraction with structural changes- - NYHA class II - euvolemic today - weighing daily; reminded to call for an overnight weight gain of >2 pounds or a weekly weight gain of >5 pounds - weight 262.6 pounds from last visit here 2 months ago - not adding salt and patient and son have been reading food labels for sodium content; reviewed the importance  of following a 2000mg  sodium diet  - BNP 12/15/19 was 112.0   2: HTN- - BP   - BMP from 12/15/19 reviewed and showed sodium 137, potassium 3.8, creatinine 0.90 and GFR >60  3: DM- - A1c 11/13/2019 was 7.9% - fasting glucose in clinic today was     Medication list was reviewed.

## 2021-03-30 ENCOUNTER — Other Ambulatory Visit: Payer: Self-pay | Admitting: Physician Assistant

## 2021-03-30 DIAGNOSIS — Z1231 Encounter for screening mammogram for malignant neoplasm of breast: Secondary | ICD-10-CM

## 2021-04-25 ENCOUNTER — Ambulatory Visit: Payer: Self-pay | Attending: Oncology

## 2021-04-26 ENCOUNTER — Encounter: Payer: Self-pay | Admitting: Physician Assistant

## 2021-04-27 ENCOUNTER — Telehealth: Payer: Self-pay | Admitting: *Deleted

## 2021-05-25 ENCOUNTER — Other Ambulatory Visit: Payer: Self-pay

## 2021-05-25 ENCOUNTER — Ambulatory Visit (LOCAL_COMMUNITY_HEALTH_CENTER): Payer: Self-pay

## 2021-05-25 DIAGNOSIS — Z111 Encounter for screening for respiratory tuberculosis: Secondary | ICD-10-CM

## 2021-05-25 NOTE — Progress Notes (Signed)
pp

## 2021-05-28 ENCOUNTER — Ambulatory Visit (LOCAL_COMMUNITY_HEALTH_CENTER): Payer: Self-pay

## 2021-05-28 ENCOUNTER — Other Ambulatory Visit: Payer: Self-pay

## 2021-05-28 DIAGNOSIS — Z111 Encounter for screening for respiratory tuberculosis: Secondary | ICD-10-CM

## 2021-05-28 LAB — TB SKIN TEST
Induration: 0 mm
TB Skin Test: NEGATIVE

## 2023-01-06 ENCOUNTER — Other Ambulatory Visit: Payer: Self-pay

## 2023-01-06 ENCOUNTER — Emergency Department: Payer: Medicaid Other

## 2023-01-06 ENCOUNTER — Encounter: Payer: Self-pay | Admitting: Certified Registered Nurse Anesthetist

## 2023-01-06 ENCOUNTER — Inpatient Hospital Stay
Admission: EM | Admit: 2023-01-06 | Discharge: 2023-01-11 | DRG: 391 | Disposition: A | Discharge status: Home / Self Care | Payer: Medicaid Other | Attending: Internal Medicine | Admitting: Internal Medicine

## 2023-01-06 ENCOUNTER — Inpatient Hospital Stay (HOSPITAL_COMMUNITY)
Admit: 2023-01-06 | Discharge: 2023-01-06 | Disposition: A | Discharge status: Home / Self Care | Payer: Medicaid Other | Attending: Internal Medicine | Admitting: Internal Medicine

## 2023-01-06 DIAGNOSIS — I5021 Acute systolic (congestive) heart failure: Secondary | ICD-10-CM

## 2023-01-06 DIAGNOSIS — E8729 Other acidosis: Secondary | ICD-10-CM

## 2023-01-06 DIAGNOSIS — B9620 Unspecified Escherichia coli [E. coli] as the cause of diseases classified elsewhere: Secondary | ICD-10-CM | POA: Diagnosis present

## 2023-01-06 DIAGNOSIS — I420 Dilated cardiomyopathy: Secondary | ICD-10-CM | POA: Diagnosis present

## 2023-01-06 DIAGNOSIS — Z8616 Personal history of COVID-19: Secondary | ICD-10-CM

## 2023-01-06 DIAGNOSIS — I160 Hypertensive urgency: Secondary | ICD-10-CM | POA: Diagnosis not present

## 2023-01-06 DIAGNOSIS — K5730 Diverticulosis of large intestine without perforation or abscess without bleeding: Secondary | ICD-10-CM | POA: Diagnosis present

## 2023-01-06 DIAGNOSIS — D696 Thrombocytopenia, unspecified: Secondary | ICD-10-CM | POA: Diagnosis present

## 2023-01-06 DIAGNOSIS — I110 Hypertensive heart disease with heart failure: Secondary | ICD-10-CM | POA: Diagnosis present

## 2023-01-06 DIAGNOSIS — E860 Dehydration: Secondary | ICD-10-CM | POA: Diagnosis present

## 2023-01-06 DIAGNOSIS — I5043 Acute on chronic combined systolic (congestive) and diastolic (congestive) heart failure: Secondary | ICD-10-CM | POA: Diagnosis present

## 2023-01-06 DIAGNOSIS — I5031 Acute diastolic (congestive) heart failure: Secondary | ICD-10-CM

## 2023-01-06 DIAGNOSIS — R1310 Dysphagia, unspecified: Secondary | ICD-10-CM | POA: Diagnosis not present

## 2023-01-06 DIAGNOSIS — Z1152 Encounter for screening for COVID-19: Secondary | ICD-10-CM

## 2023-01-06 DIAGNOSIS — R1110 Vomiting, unspecified: Secondary | ICD-10-CM | POA: Diagnosis not present

## 2023-01-06 DIAGNOSIS — E872 Acidosis, unspecified: Secondary | ICD-10-CM | POA: Diagnosis present

## 2023-01-06 DIAGNOSIS — I255 Ischemic cardiomyopathy: Secondary | ICD-10-CM | POA: Diagnosis present

## 2023-01-06 DIAGNOSIS — I4819 Other persistent atrial fibrillation: Secondary | ICD-10-CM | POA: Diagnosis present

## 2023-01-06 DIAGNOSIS — K5900 Constipation, unspecified: Secondary | ICD-10-CM | POA: Diagnosis present

## 2023-01-06 DIAGNOSIS — N390 Urinary tract infection, site not specified: Secondary | ICD-10-CM | POA: Diagnosis present

## 2023-01-06 DIAGNOSIS — K2970 Gastritis, unspecified, without bleeding: Secondary | ICD-10-CM | POA: Diagnosis present

## 2023-01-06 DIAGNOSIS — I2489 Other forms of acute ischemic heart disease: Secondary | ICD-10-CM | POA: Diagnosis present

## 2023-01-06 DIAGNOSIS — K640 First degree hemorrhoids: Secondary | ICD-10-CM | POA: Diagnosis present

## 2023-01-06 DIAGNOSIS — R7989 Other specified abnormal findings of blood chemistry: Secondary | ICD-10-CM

## 2023-01-06 DIAGNOSIS — K209 Esophagitis, unspecified without bleeding: Secondary | ICD-10-CM | POA: Diagnosis present

## 2023-01-06 DIAGNOSIS — R778 Other specified abnormalities of plasma proteins: Secondary | ICD-10-CM | POA: Diagnosis not present

## 2023-01-06 DIAGNOSIS — Z79899 Other long term (current) drug therapy: Secondary | ICD-10-CM

## 2023-01-06 DIAGNOSIS — E876 Hypokalemia: Secondary | ICD-10-CM | POA: Diagnosis present

## 2023-01-06 DIAGNOSIS — E669 Obesity, unspecified: Secondary | ICD-10-CM | POA: Diagnosis present

## 2023-01-06 DIAGNOSIS — Z7901 Long term (current) use of anticoagulants: Secondary | ICD-10-CM

## 2023-01-06 DIAGNOSIS — E1169 Type 2 diabetes mellitus with other specified complication: Secondary | ICD-10-CM

## 2023-01-06 DIAGNOSIS — I4891 Unspecified atrial fibrillation: Principal | ICD-10-CM

## 2023-01-06 DIAGNOSIS — Z6833 Body mass index (BMI) 33.0-33.9, adult: Secondary | ICD-10-CM | POA: Diagnosis not present

## 2023-01-06 DIAGNOSIS — R634 Abnormal weight loss: Secondary | ICD-10-CM | POA: Diagnosis present

## 2023-01-06 DIAGNOSIS — E119 Type 2 diabetes mellitus without complications: Secondary | ICD-10-CM | POA: Diagnosis present

## 2023-01-06 DIAGNOSIS — D72819 Decreased white blood cell count, unspecified: Secondary | ICD-10-CM | POA: Insufficient documentation

## 2023-01-06 DIAGNOSIS — I429 Cardiomyopathy, unspecified: Secondary | ICD-10-CM | POA: Diagnosis not present

## 2023-01-06 DIAGNOSIS — Z8673 Personal history of transient ischemic attack (TIA), and cerebral infarction without residual deficits: Secondary | ICD-10-CM

## 2023-01-06 DIAGNOSIS — N179 Acute kidney failure, unspecified: Secondary | ICD-10-CM | POA: Diagnosis present

## 2023-01-06 DIAGNOSIS — Z7984 Long term (current) use of oral hypoglycemic drugs: Secondary | ICD-10-CM

## 2023-01-06 LAB — COMPREHENSIVE METABOLIC PANEL
ALT: 16 U/L (ref 0–44)
AST: 26 U/L (ref 15–41)
Albumin: 3.4 g/dL — ABNORMAL LOW (ref 3.5–5.0)
Alkaline Phosphatase: 53 U/L (ref 38–126)
Anion gap: 18 — ABNORMAL HIGH (ref 5–15)
BUN: 23 mg/dL — ABNORMAL HIGH (ref 6–20)
CO2: 24 mmol/L (ref 22–32)
Calcium: 11.2 mg/dL — ABNORMAL HIGH (ref 8.9–10.3)
Chloride: 95 mmol/L — ABNORMAL LOW (ref 98–111)
Creatinine, Ser: 1.21 mg/dL — ABNORMAL HIGH (ref 0.44–1.00)
GFR, Estimated: 52 mL/min — ABNORMAL LOW (ref >60)
Glucose, Bld: 184 mg/dL — ABNORMAL HIGH (ref 70–99)
Potassium: 4.4 mmol/L (ref 3.5–5.1)
Sodium: 137 mmol/L (ref 135–145)
Total Bilirubin: 1.5 mg/dL — ABNORMAL HIGH (ref 0.3–1.2)
Total Protein: 8.3 g/dL — ABNORMAL HIGH (ref 6.5–8.1)

## 2023-01-06 LAB — URINALYSIS, ROUTINE W REFLEX MICROSCOPIC
Bilirubin Urine: NEGATIVE
Glucose, UA: NEGATIVE mg/dL
Ketones, ur: 5 mg/dL — AB
Nitrite: NEGATIVE
Protein, ur: 100 mg/dL — AB
Specific Gravity, Urine: 1.019 (ref 1.005–1.030)
WBC, UA: >50 WBC/hpf (ref 0–5)
pH: 5 (ref 5.0–8.0)

## 2023-01-06 LAB — ECHOCARDIOGRAM COMPLETE
AR max vel: 3.21 cm2
AV Area VTI: 2.71 cm2
AV Area mean vel: 3.19 cm2
AV Mean grad: 4 mmHg
AV Peak grad: 9.6 mmHg
Ao pk vel: 1.55 m/s
Area-P 1/2: 4.74 cm2
Height: 66 in
MV VTI: 3.16 cm2
P 1/2 time: 918 msec
S' Lateral: 5.4 cm
Weight: 3332.8 oz

## 2023-01-06 LAB — BASIC METABOLIC PANEL
Anion gap: 12 (ref 5–15)
BUN: 22 mg/dL — ABNORMAL HIGH (ref 6–20)
CO2: 24 mmol/L (ref 22–32)
Calcium: 9.7 mg/dL (ref 8.9–10.3)
Chloride: 96 mmol/L — ABNORMAL LOW (ref 98–111)
Creatinine, Ser: 1.11 mg/dL — ABNORMAL HIGH (ref 0.44–1.00)
GFR, Estimated: 58 mL/min — ABNORMAL LOW (ref >60)
Glucose, Bld: 163 mg/dL — ABNORMAL HIGH (ref 70–99)
Potassium: 5 mmol/L (ref 3.5–5.1)
Sodium: 132 mmol/L — ABNORMAL LOW (ref 135–145)

## 2023-01-06 LAB — CK: Total CK: 22 U/L — ABNORMAL LOW (ref 38–234)

## 2023-01-06 LAB — GLUCOSE, CAPILLARY: Glucose-Capillary: 95 mg/dL (ref 70–99)

## 2023-01-06 LAB — CBC WITH DIFFERENTIAL/PLATELET
Abs Immature Granulocytes: 0.01 10*3/uL (ref 0.00–0.07)
Basophils Absolute: 0 10*3/uL (ref 0.0–0.1)
Basophils Relative: 0 %
Eosinophils Absolute: 0 10*3/uL (ref 0.0–0.5)
Eosinophils Relative: 0 %
HCT: 52.9 % — ABNORMAL HIGH (ref 36.0–46.0)
Hemoglobin: 16.7 g/dL — ABNORMAL HIGH (ref 12.0–15.0)
Immature Granulocytes: 0 %
Lymphocytes Relative: 24 %
Lymphs Abs: 1 10*3/uL (ref 0.7–4.0)
MCH: 25.2 pg — ABNORMAL LOW (ref 26.0–34.0)
MCHC: 31.6 g/dL (ref 30.0–36.0)
MCV: 79.7 fL — ABNORMAL LOW (ref 80.0–100.0)
Monocytes Absolute: 0.5 10*3/uL (ref 0.1–1.0)
Monocytes Relative: 13 %
Neutro Abs: 2.7 10*3/uL (ref 1.7–7.7)
Neutrophils Relative %: 63 %
Platelets: 196 10*3/uL (ref 150–400)
RBC: 6.64 MIL/uL — ABNORMAL HIGH (ref 3.87–5.11)
RDW: 18.4 % — ABNORMAL HIGH (ref 11.5–15.5)
WBC: 4.2 10*3/uL (ref 4.0–10.5)
nRBC: 0.5 % — ABNORMAL HIGH (ref 0.0–0.2)

## 2023-01-06 LAB — RESP PANEL BY RT-PCR (FLU A&B, COVID) ARPGX2
Influenza A by PCR: NEGATIVE
Influenza B by PCR: NEGATIVE
SARS Coronavirus 2 by RT PCR: NEGATIVE

## 2023-01-06 LAB — CBG MONITORING, ED
Glucose-Capillary: 121 mg/dL — ABNORMAL HIGH (ref 70–99)
Glucose-Capillary: 132 mg/dL — ABNORMAL HIGH (ref 70–99)
Glucose-Capillary: 159 mg/dL — ABNORMAL HIGH (ref 70–99)
Glucose-Capillary: 164 mg/dL — ABNORMAL HIGH (ref 70–99)
Glucose-Capillary: 185 mg/dL — ABNORMAL HIGH (ref 70–99)

## 2023-01-06 LAB — HIV ANTIBODY (ROUTINE TESTING W REFLEX): HIV Screen 4th Generation wRfx: NONREACTIVE

## 2023-01-06 LAB — PROTIME-INR
INR: 1.2 (ref 0.8–1.2)
Prothrombin Time: 15.1 seconds (ref 11.4–15.2)

## 2023-01-06 LAB — MAGNESIUM: Magnesium: 2.4 mg/dL (ref 1.7–2.4)

## 2023-01-06 LAB — HEMOGLOBIN A1C
Hgb A1c MFr Bld: 8.2 % — ABNORMAL HIGH (ref 4.8–5.6)
Mean Plasma Glucose: 188.64 mg/dL

## 2023-01-06 LAB — BETA-HYDROXYBUTYRIC ACID: Beta-Hydroxybutyric Acid: 1.53 mmol/L — ABNORMAL HIGH (ref 0.05–0.27)

## 2023-01-06 LAB — LACTIC ACID, PLASMA
Lactic Acid, Venous: 3.8 mmol/L (ref 0.5–1.9)
Lactic Acid, Venous: 2.2 mmol/L (ref 0.5–1.9)

## 2023-01-06 LAB — BRAIN NATRIURETIC PEPTIDE: B Natriuretic Peptide: 553.3 pg/mL — ABNORMAL HIGH (ref 0.0–100.0)

## 2023-01-06 LAB — TSH: TSH: 0.689 u[IU]/mL (ref 0.350–4.500)

## 2023-01-06 LAB — TROPONIN I (HIGH SENSITIVITY)
Troponin I (High Sensitivity): 93 ng/L — ABNORMAL HIGH (ref <18)
Troponin I (High Sensitivity): 84 ng/L — ABNORMAL HIGH (ref <18)

## 2023-01-06 LAB — HEPARIN LEVEL (UNFRACTIONATED)
Heparin Unfractionated: <0.1 IU/mL — ABNORMAL LOW (ref 0.30–0.70)
Heparin Unfractionated: 1.04 IU/mL — ABNORMAL HIGH (ref 0.30–0.70)

## 2023-01-06 LAB — APTT: aPTT: 30 seconds (ref 24–36)

## 2023-01-06 MED ORDER — SODIUM CHLORIDE 0.9 % IV SOLN
1.0000 g | INTRAVENOUS | Status: AC
  Administered 2023-01-06: 1 g via INTRAVENOUS
  Filled 2023-01-06: qty 10

## 2023-01-06 MED ORDER — ACETAMINOPHEN 650 MG RE SUPP: 650.0000 mg | Freq: Four times a day (QID) | RECTAL | Status: DC | PRN

## 2023-01-06 MED ORDER — SODIUM CHLORIDE 0.9 % IV SOLN
2.0000 g | INTRAVENOUS | Status: DC
  Administered 2023-01-07 – 2023-01-09 (×3): 2 g via INTRAVENOUS
  Filled 2023-01-06 (×3): qty 20

## 2023-01-06 MED ORDER — HYDRALAZINE HCL 20 MG/ML IJ SOLN
5.0000 mg | Freq: Once | INTRAMUSCULAR | Status: AC
  Administered 2023-01-06: 5 mg via INTRAVENOUS
  Filled 2023-01-06: qty 1

## 2023-01-06 MED ORDER — HEPARIN BOLUS VIA INFUSION
5000.0000 [IU] | Freq: Once | INTRAVENOUS | Status: AC
  Administered 2023-01-06: 5000 [IU] via INTRAVENOUS
  Filled 2023-01-06: qty 5000

## 2023-01-06 MED ORDER — HYDRALAZINE HCL 20 MG/ML IJ SOLN
5.0000 mg | INTRAMUSCULAR | Status: DC | PRN
  Administered 2023-01-07: 5 mg via INTRAVENOUS
  Filled 2023-01-06 (×2): qty 1

## 2023-01-06 MED ORDER — ENOXAPARIN SODIUM 40 MG/0.4ML IJ SOSY: 40.0000 mg | PREFILLED_SYRINGE | INTRAMUSCULAR | Status: DC

## 2023-01-06 MED ORDER — HEPARIN (PORCINE) 25000 UT/250ML-% IV SOLN
1050.0000 [IU]/h | INTRAVENOUS | Status: AC
  Administered 2023-01-06: 1200 [IU]/h via INTRAVENOUS
  Filled 2023-01-06: qty 250

## 2023-01-06 MED ORDER — ONDANSETRON HCL 4 MG/2ML IJ SOLN
4.0000 mg | Freq: Four times a day (QID) | INTRAMUSCULAR | Status: DC | PRN
  Administered 2023-01-08: 4 mg via INTRAVENOUS
  Filled 2023-01-06: qty 2

## 2023-01-06 MED ORDER — LACTATED RINGERS IV SOLN: INTRAVENOUS | Status: AC

## 2023-01-06 MED ORDER — ONDANSETRON HCL 4 MG PO TABS: 4.0000 mg | ORAL_TABLET | Freq: Four times a day (QID) | ORAL | Status: DC | PRN

## 2023-01-06 MED ORDER — INSULIN ASPART 100 UNIT/ML IJ SOLN
0.0000 [IU] | INTRAMUSCULAR | Status: DC
  Administered 2023-01-06 (×2): 3 [IU] via SUBCUTANEOUS
  Administered 2023-01-06: 2 [IU] via SUBCUTANEOUS
  Administered 2023-01-07: 3 [IU] via SUBCUTANEOUS
  Administered 2023-01-07: 5 [IU] via SUBCUTANEOUS
  Administered 2023-01-08: 3 [IU] via SUBCUTANEOUS
  Administered 2023-01-08 – 2023-01-09 (×4): 2 [IU] via SUBCUTANEOUS
  Administered 2023-01-09: 3 [IU] via SUBCUTANEOUS
  Administered 2023-01-09 (×2): 2 [IU] via SUBCUTANEOUS
  Administered 2023-01-10: 5 [IU] via SUBCUTANEOUS
  Administered 2023-01-10 (×2): 3 [IU] via SUBCUTANEOUS
  Administered 2023-01-11: 5 [IU] via SUBCUTANEOUS
  Administered 2023-01-11: 3 [IU] via SUBCUTANEOUS
  Administered 2023-01-11: 5 [IU] via SUBCUTANEOUS
  Administered 2023-01-11: 3 [IU] via SUBCUTANEOUS
  Filled 2023-01-06 (×20): qty 1

## 2023-01-06 MED ORDER — PANTOPRAZOLE SODIUM 40 MG IV SOLR
40.0000 mg | Freq: Two times a day (BID) | INTRAVENOUS | Status: DC
  Administered 2023-01-06 – 2023-01-11 (×11): 40 mg via INTRAVENOUS
  Filled 2023-01-06 (×11): qty 10

## 2023-01-06 MED ORDER — SODIUM CHLORIDE 0.9 % IV SOLN
INTRAVENOUS | Status: DC
  Administered 2023-01-07: 1000 mL via INTRAVENOUS

## 2023-01-06 MED ORDER — ONDANSETRON HCL 4 MG/2ML IJ SOLN
4.0000 mg | INTRAMUSCULAR | Status: AC
  Administered 2023-01-06: 4 mg via INTRAVENOUS
  Filled 2023-01-06: qty 2

## 2023-01-06 MED ORDER — ACETAMINOPHEN 325 MG PO TABS: 650.0000 mg | ORAL_TABLET | Freq: Four times a day (QID) | ORAL | Status: DC | PRN

## 2023-01-06 MED ORDER — SODIUM CHLORIDE 0.9 % IV BOLUS
500.0000 mL | Freq: Once | INTRAVENOUS | Status: AC
  Administered 2023-01-06: 500 mL via INTRAVENOUS

## 2023-01-06 MED ORDER — MORPHINE SULFATE (PF) 2 MG/ML IV SOLN: 2.0000 mg | INTRAVENOUS | Status: DC | PRN

## 2023-01-06 NOTE — Assessment & Plan Note (Signed)
BP 189/122 on arrival Likely related to inability to take oral meds, versus noncompliance.  History of hospitalization in 2020 with hypertensive urgency and acute diastolic CHF Hydralazine IV every 6 until able to tolerate orally

## 2023-01-06 NOTE — ED Notes (Signed)
Assumed care from [facility]. Pt resting comfortably in bed at this time. Pt denies any current needs or questions. Call light with in reach.

## 2023-01-06 NOTE — Assessment & Plan Note (Signed)
Secondary to poor oral intake IV hydration and monitor

## 2023-01-06 NOTE — Consult Note (Signed)
ANTICOAGULATION CONSULT NOTE   Pharmacy Consult for Heparin Indication:  New Onset Atrial Fibrillation  No Known Allergies  Patient Measurements: Height: 5\' 6"  (167.6 cm) Weight: 94.5 kg (208 lb 4.8 oz) IBW/kg (Calculated) : 59.3 Heparin Dosing Weight: 80.2 kg  Vital Signs: Temp: 98.4 F (36.9 C) (02/05 1957) BP: 171/101 (02/05 1957) Pulse Rate: 87 (02/05 1957)  Labs: Recent Labs    01/06/23 0034 01/06/23 0035 01/06/23 0333 01/06/23 1606 01/06/23 2317  HGB  --  16.7*  --   --   --   HCT  --  52.9*  --   --   --   PLT  --  196  --   --   --   APTT  --   --   --  30  --   LABPROT  --   --   --  15.1  --   INR  --   --   --  1.2  --   HEPARINUNFRC  --   --   --  <0.10* 1.04*  CREATININE  --  1.21*  --  1.11*  --   CKTOTAL  --  22*  --   --   --   TROPONINIHS 93*  --  84*  --   --      Estimated Creatinine Clearance: 64.8 mL/min (A) (by C-G formula based on SCr of 1.11 mg/dL (H)).   Medical History: Past Medical History:  Diagnosis Date   CHF (congestive heart failure) ([place])    Diabetes mellitus without complication ([facility])    Hypertension     Medications:  No history of chronic anticoagulant use PTA  Assessment: 57 year old female who has a past medical history of hypertension, diabetes, and congestive heart failure presenting to the ED with nausea and vomiting. Upon further assessment and EKG results, was found to have new onset atrial fibrillation. Pharmacy has been consulted to initiation and titrate heparin infusion. Baseline labs have been ordered and are pending.  Goal of Therapy:  Heparin level 0.3-0.7 units/ml Monitor platelets by anticoagulation protocol: Yes   2/05 2317 HL 1.04, supratherapeutic  Plan:  Decrease heparin infusion to 1050 units/hr Recheck HL @ 0400 after rate change, prior to stopping Continue to monitor H&H and platelets  **Addendum** Per GI team, patient is to have EGD at 1000 tomorrow morning(01/07/23). Heparin infusion must be  stopped 6 hours prior to procedure. Stop time has been placed. Will follow up post-EGD with restart of heparin infusion.  Renda Rolls, PharmD, [facility] 01/06/2023 11:42 PM

## 2023-01-06 NOTE — Consult Note (Signed)
ANTICOAGULATION CONSULT NOTE - Initial Consult  Pharmacy Consult for Heparin Indication:  New Onset Atrial Fibrillation  No Known Allergies  Patient Measurements: Height: 5\' 6"  (167.6 cm) Weight: 94.5 kg (208 lb 4.8 oz) IBW/kg (Calculated) : 59.3 Heparin Dosing Weight: 80.2 kg  Vital Signs: Temp: 98.4 F (36.9 C) (02/05 1137) Temp Source: Oral (02/05 1137) BP: 175/91 (02/05 1230) Pulse Rate: 66 (02/05 1230)  Labs: Recent Labs    01/06/23 0034 01/06/23 0035 01/06/23 0333  HGB  --  16.7*  --   HCT  --  52.9*  --   PLT  --  196  --   CREATININE  --  1.21*  --   CKTOTAL  --  22*  --   TROPONINIHS 93*  --  84*    Estimated Creatinine Clearance: 59.4 mL/min (A) (by C-G formula based on SCr of 1.21 mg/dL (H)).   Medical History: Past Medical History:  Diagnosis Date   CHF (congestive heart failure) ([place])    Diabetes mellitus without complication ([facility])    Hypertension     Medications:  No history of chronic anticoagulant use PTA  Assessment: 57 year old female who has a past medical history of hypertension, diabetes, and congestive heart failure presenting to the ED with nausea and vomiting. Upon further assessment and EKG results, was found to have new onset atrial fibrillation. Pharmacy has been consulted to initiation and titrate heparin infusion. Baseline labs have been ordered and are pending.  Goal of Therapy:  Heparin level 0.3-0.7 units/ml Monitor platelets by anticoagulation protocol: Yes   Plan:  Give 5000 units bolus x 1 Start heparin infusion at 1200 units/hr Check anti-Xa level in 6 hours and daily while on heparin Continue to monitor H&H and platelets  **Addendum** Per GI team, patient is to have EGD at 1000 tomorrow morning(01/07/23). Heparin infusion must be stopped 6 hours prior to procedure. Stop time has been placed. Will follow up post-EGD with restart of heparin infusion.  Lance Coon A Valdez Brannan 01/06/2023,3:12 PM

## 2023-01-06 NOTE — Assessment & Plan Note (Signed)
Troponin elevated at 93 with BNP 553 Patient clinically dry and chest x-ray nonacute Likely secondary to demand ischemia from elevated blood pressure, dehydration Continue to trend Echocardiogram to evaluate for focal wall motion abnormality in view of risk factors

## 2023-01-06 NOTE — Assessment & Plan Note (Addendum)
Calcium elevated at 11.2 Hydrate and monitor Further diagnostic evaluation if not corrected

## 2023-01-06 NOTE — Assessment & Plan Note (Signed)
Secondary to poor oral intake and with some concern for underlying occult process BMI change from above 40>33 in 2 years Patient not up-to-date with age-related cancer screenings

## 2023-01-06 NOTE — Assessment & Plan Note (Signed)
Sliding scale insulin coverage 

## 2023-01-06 NOTE — H&P (View-Only) (Signed)
GI Inpatient Consult Note  Reason for Consult: nausea/vomiting  Attending Requesting Consult: Dr. Damita Dunnings  History of Present Illness: [name] is a 57 y.o. female seen for evaluation of nausea/vomiting at the request of Dr. Damita Dunnings.  PMHx of CHF, DM, HTN. Reports over the past 2-4 weeks developing issues w/ significant nausea/vomiting, she reports anytime she eats she will vomit bile/clear liquids with the food mixed in soon after the oral intake. She feels she is vomiting at least daily but sometimes several times a day. She denies any dysphagia, GERD, or epigastric pain. Feels she swallows foods well. She denies any med changes, etc when symptoms began. Reports was not taking any meds when symptoms started except ibuprofen daily for back pain and head aches.   She reports having some mild constipation with the vomiting likely due to poor PO intake. Reports occasional mild lower abd pain but no severe pain. No post prandial abd pain. She denies any rectal bleeding. No diarrhea. No dark stools.   She denies tobacco intake. She has very occasional alcohol intake.   No known family GI hx. Reports she has not been to the doctor in a few years. She lives with her son.    Last Colonoscopy: none prior Last Endoscopy: none prior   Past Medical History:  Past Medical History:  Diagnosis Date   CHF (congestive heart failure) ([place])    Diabetes mellitus without complication ([place])    Hypertension     Problem List: Patient Active Problem List   Diagnosis Date Noted   Hypercalcemia 01/06/2023   High anion gap metabolic acidosis 46/50/3546   Dysphagia 01/06/2023   Unintentional weight loss 01/06/2023   AKI (acute kidney injury) ([place]) 01/06/2023   Intractable vomiting 01/06/2023   Urinary tract infection 01/06/2023   Diabetes mellitus without complication ([place]) 56/81/2751   Lactic acidosis 01/06/2023   Elevated troponin 70/12/7492   Acute diastolic CHF (congestive heart  failure) (H[facility])    Obesity, Class III, BMI 40-49.9 (morbid obesity) ([place])    Hypertensive urgency 11/13/2019    Past Surgical History: History reviewed. No pertinent surgical history.  Allergies: No Known Allergies  Home Medications: (Not in a hospital admission)  Home medication reconciliation was completed with the patient.   Scheduled Inpatient Medications:    enoxaparin (LOVENOX) injection  40 mg Subcutaneous Q24H   insulin aspart  0-15 Units Subcutaneous Q4H    Continuous Inpatient Infusions:    [START ON 01/07/2023] cefTRIAXone (ROCEPHIN)  IV     lactated ringers 100 mL/hr at 01/06/23 0458    PRN Inpatient Medications:  acetaminophen **OR** acetaminophen, hydrALAZINE, morphine injection, ondansetron **OR** ondansetron (ZOFRAN) IV  Family History: Denies any family history of colon polyps or colon cancer.  Social History:   reports that she has never smoked. She has never used smokeless tobacco. She reports current alcohol use. She reports that she does not use drugs.   Review of Systems: Constitutional: + weight loss Eyes: No changes in vision. ENT: No oral lesions, sore throat.  GI: see HPI.  Heme/Lymph: No easy bruising.  CV: No chest pain.  GU: No hematuria.  Integumentary: No rashes.  Neuro: No headaches.  Psych: No depression/anxiety.  Endocrine: No heat/cold intolerance.  Allergic/Immunologic: No urticaria.  Resp: No cough, SOB.  Musculoskeletal: No joint swelling.    Physical Examination: BP (!) 152/100   Pulse 91   Temp 98.3 F (36.8 C)   Resp 16   Ht 5\' 6"  (1.676 m)  Wt 94.5 kg   LMP 12/03/2022 (Approximate)   SpO2 97%   BMI 33.62 kg/m  Gen: NAD, alert and oriented x 4, sitting on side of bed  HEENT: PEERLA, EOMI, Neck: supple, no JVD or thyromegaly Chest: CTA bilaterally, no wheezes, crackles, or other adventitious sounds CV: RRR, no m/g/c/r Abd: soft, NT, ND, +BS in all four quadrants; no HSM, guarding, ridigity, or rebound  tenderness Ext: no edema, well perfused with 2+ pulses, Skin: no rash or lesions noted Lymph: no LAD  Data: Lab Results  Component Value Date   WBC 4.2 01/06/2023   HGB 16.7 (H) 01/06/2023   HCT 52.9 (H) 01/06/2023   MCV 79.7 (L) 01/06/2023   PLT 196 01/06/2023   Recent Labs  Lab 01/06/23 0035  HGB 16.7*   Lab Results  Component Value Date   NA 137 01/06/2023   K 4.4 01/06/2023   CL 95 (L) 01/06/2023   CO2 24 01/06/2023   BUN 23 (H) 01/06/2023   CREATININE 1.21 (H) 01/06/2023   Lab Results  Component Value Date   ALT 16 01/06/2023   AST 26 01/06/2023   ALKPHOS 53 01/06/2023   BILITOT 1.5 (H) 01/06/2023   No results for input(s): "APTT", "INR", "PTT" in the last 168 hours.  IMPRESSION: Negative abdominal radiographs.  No acute cardiopulmonary diseas  Assessment/Plan: Ms. Seki is a 58 y.o. female admitted for vomiting, dysphagia, and poor PO intake.  Intractable vomiting - ddx would include gastritis, PUD (has daily NSAID use), associated w/ weight loss of about #25lbs. Needs EGD for evaluation, will plan for today. Likely will need PPI after EGD. If EGD unremarkable would consider abd imaging.  AKI - related to dehydration from #1.  Unintentional wt loss-likely due to #1, ultimately will need colonoscopy as outpatient for evaluation but would not tolerate prep currently. Hypercalcemia HTN urgency - per primary team, not taking any BP meds at home. Improving since admission DM  Elevated lactic acid - improving after rehydration   Recommendations:  EGD today Hold AM Lovenox  NPO until procedure Continue supportive care per primary team   Case was discussed with Dr. Virgina Jock. Thank you for the consult. Please call with questions or concerns.  Ezzard Standing, PA-C [facility]

## 2023-01-06 NOTE — Plan of Care (Signed)
  Problem: Education: Goal: Knowledge of General Education information will improve Description: Including pain rating scale, medication(s)/side effects and non-pharmacologic comfort measures Outcome: Not Progressing   Problem: Health Behavior/Discharge Planning: Goal: Ability to manage health-related needs will improve Outcome: Not Progressing   Problem: Clinical Measurements: Goal: Ability to maintain clinical measurements within normal limits will improve Outcome: Not Progressing Goal: Will remain free from infection Outcome: Not Progressing Goal: Diagnostic test results will improve Outcome: Not Progressing Goal: Respiratory complications will improve Outcome: Not Progressing Goal: Cardiovascular complication will be avoided Outcome: Not Progressing   Problem: Activity: Goal: Risk for activity intolerance will decrease Outcome: Not Progressing   Problem: Nutrition: Goal: Adequate nutrition will be maintained Outcome: Not Progressing   Problem: Coping: Goal: Level of anxiety will decrease Outcome: Not Progressing   Problem: Elimination: Goal: Will not experience complications related to bowel motility Outcome: Not Progressing Goal: Will not experience complications related to urinary retention Outcome: Not Progressing  Problem: Safety: Goal: Ability to remain free from injury will improve Outcome: Not Progressing   Problem: Skin Integrity: Goal: Risk for impaired skin integrity will decrease Outcome: Not Progressing   Problem: Education: Goal: Knowledge of disease or condition will improve Outcome: Not Progressing Goal: Understanding of medication regimen will improve Outcome: Not Progressing Goal: Individualized Educational Video(s) Outcome: Not Progressing   Problem: Activity: Goal: Ability to tolerate increased activity will improve Outcome: Not Progressing   Problem: Cardiac: Goal: Ability to achieve and maintain adequate cardiopulmonary perfusion  will improve Outcome: Not Progressing   Problem: Health Behavior/Discharge Planning: Goal: Ability to safely manage health-related needs after discharge will improve Outcome: Not Progressing   Problem: Pain Managment: Goal: General experience of comfort will improve 01/06/2023 2252 by Neomia Glass, RN Outcome: Progressing 01/06/2023 2252 by Neomia Glass, RN Outcome: Not Progressing Patient reports needing walker a home able to manage all ADL care at home patient reports vomiting with any oral intake at home Son does shopping for patient

## 2023-01-06 NOTE — Consult Note (Signed)
Cardiology Consultation   Patient ID: [name] MRN: [id number]; DOB: 1966-03-31  Admit date: 01/06/2023 Date of Consult: 01/06/2023  PCP:  Marya Fossa, Cordelia Poche   [facility] Providers Cardiologist:  None      New consult done by Dr. Kirke Corin  Patient Profile:   [name] is a 57 y.o. female with a hx of hypertension, diabetes, congestive heart failure, who is being seen 01/06/2023 for the evaluation of  at the request of Dr. Kirby Crigler.  History of Present Illness:   [name] is a 57 year old female who has a past medical history of hypertension, diabetes, congestive heart failure.  She presented to the [facility] emergency department today stating that she was unable to keep any food on her stomach.  She does have approximately 45 days only that she can do is drink soups and juices and water but anything solid that she eats she will vomit up with approximately 10 tenths.  She states that did not feel like things are getting stuck in her chest but rather when she eats something once and hits her stomach so that she will throw up almost anything solid.  She states that she is lost over 25 pounds in the last 45 days.  She also stated she has not been able to swallow any of her medications in the last week.  She denies any chest pain, shortness of breath, palpitations, lightheadedness.  She has an occasional cough.  Initial vitals: Blood pressure 189/122, pulse of 78, respirations of 19, temperature 97.9  Pertinent labs RBC 6.64, hemoglobin 16.7, chloride 95, blood glucose of 184, BUN 23, serum creatinine of 1.21, calcium 11.2, albumin 3.4, GFR 52, BNP 553.3, total CK of 22, lactic acid 3.8, high-sensitivity troponin of 93 and 84, respiratory panel negative  Imaging: DG abdomen acute revealed negative abdominal radiographs with no acute cardiopulmonary disease  Medications given in the emergency department: Zofran 4 mg IVP, sodium chloride 500 mL, Rocephin 1 g  IVPB, hydroxylate 5 mg IVP   Past Medical History:  Diagnosis Date   CHF (congestive heart failure) (HCC)    Diabetes mellitus without complication (HCC)    Hypertension     History reviewed. No pertinent surgical history.   Home Medications:  Prior to Admission medications   Medication Sig Start Date End Date Taking? Authorizing Provider  amLODipine (NORVASC) 5 MG tablet Take 2 tablets (10 mg total) by mouth daily. 12/15/19  Yes Jene Every, MD  carvedilol (COREG) 6.25 MG tablet Take 1 tablet (6.25 mg total) by mouth 2 (two) times daily with a meal. 11/15/19  Yes Wieting, Richard, MD  furosemide (LASIX) 40 MG tablet Take 1 tablet (40 mg total) by mouth daily. 11/15/19  Yes Wieting, Richard, MD  hydrALAZINE (APRESOLINE) 25 MG tablet Take 1 tablet (25 mg total) by mouth 3 (three) times daily. 12/15/19 01/06/23 Yes Sharman Cheek, MD  lisinopril (ZESTRIL) 40 MG tablet Take 1 tablet (40 mg total) by mouth daily. 12/15/19  Yes Jene Every, MD  metFORMIN (GLUCOPHAGE) 500 MG tablet Take 1 tablet (500 mg total) by mouth 2 (two) times daily with a meal. 11/15/19  Yes Wieting, Richard, MD  pantoprazole (PROTONIX) 40 MG tablet Take 40 mg by mouth in the morning. Take 30 minutes before first meal for reflux and heartburn. Patient not taking: Reported on 01/06/2023 12/10/22   [provider]    Inpatient Medications: Scheduled Meds:  insulin aspart  0-15 Units Subcutaneous Q4H   Continuous Infusions:  sodium chloride 20 mL/hr at 01/06/23 1305   [START ON 01/07/2023] cefTRIAXone (ROCEPHIN)  IV     PRN Meds: acetaminophen **OR** acetaminophen, hydrALAZINE, morphine injection, ondansetron **OR** ondansetron (ZOFRAN) IV  Allergies:   No Known Allergies  Social History:   Social History   Socioeconomic History   Marital status: Single    Spouse name: Not on file   Number of children: Not on file   Years of education: Not on file   Highest education level: Not on file   Occupational History   Not on file  Tobacco Use   Smoking status: Never   Smokeless tobacco: Never  Vaping Use   Vaping Use: Never used  Substance and Sexual Activity   Alcohol use: Yes   Drug use: No   Sexual activity: Not Currently  Other Topics Concern   Not on file  Social History Narrative   Not on file   Social Determinants of Health   Financial Resource Strain: Not on file  Food Insecurity: No Food Insecurity (01/06/2023)   Hunger Vital Sign    Worried About Running Out of Food in the Last Year: Never true    Ran Out of Food in the Last Year: Never true  Transportation Needs: Unmet Transportation Needs (01/06/2023)   PRAPARE - Hydrologist (Medical): Yes    Lack of Transportation (Non-Medical): No  Physical Activity: Not on file  Stress: Not on file  Social Connections: Not on file  Intimate Partner Violence: Not At Risk (01/06/2023)   Humiliation, Afraid, Rape, and Kick questionnaire    Fear of Current or Ex-Partner: No    Emotionally Abused: No    Physically Abused: No    Sexually Abused: No    Family History:   History reviewed. No pertinent family history.   ROS:  Please see the history of present illness.  Review of Systems  Constitutional:  Positive for malaise/fatigue and weight loss.  Gastrointestinal:  Positive for abdominal pain, nausea and vomiting.  Neurological:  Positive for weakness.    All other ROS reviewed and negative.     Physical Exam/Data:   Vitals:   01/06/23 1130 01/06/23 1137 01/06/23 1200 01/06/23 1230  BP: (!) 169/90 (!) 169/90 (!) 152/96 (!) 175/91  Pulse: (!) 55  (!) 51 66  Resp: 13  14 12   Temp:  98.4 F (36.9 C)    TempSrc:  Oral    SpO2: 99%  96% 99%  Weight:      Height:       No intake or output data in the 24 hours ending 01/06/23 1404    01/06/2023   12:36 AM 02/01/2020   12:33 PM 12/28/2019   12:37 PM  Last 3 Weights  Weight (lbs) 208 lb 4.8 oz 262 lb 9.6 oz 264 lb  Weight (kg)  94.484 kg 119.115 kg 119.75 kg     Body mass index is 33.62 kg/m.  General:  Well nourished, well developed, in no acute distress HEENT: normal Neck: no JVD Vascular: No carotid bruits; Distal pulses 2+ bilaterally Cardiac:  normal S1, S2; irregularly irregular; no murmur  Lungs:  clear upper lobes with diminished bases to auscultation bilaterally, no wheezing, rhonchi or rales, respirations are unlabored on room air Abd: soft, nontender, no hepatomegaly  Ext: no edema Musculoskeletal:  No deformities, BUE and BLE strength normal and equal Skin: warm and dry  Neuro:  CNs 2-12 intact, no focal abnormalities noted Psych:  Normal  affect   EKG:  The EKG was personally reviewed and demonstrates: Atrial fibrillation rate of 100- 110 with a left anterior fascicular block and LVH Telemetry:  Telemetry was personally reviewed and demonstrates:  rate controlled atrial fibrillation 70-80, large quantity of aberrancy/PVC's  Relevant CV Studies: TTE 01/05/23 1. Left ventricular ejection fraction, by estimation, is 35 to 40%. The  left ventricle has moderately decreased function. The left ventricle  demonstrates global hypokinesis. The left ventricular internal cavity size  was moderately dilated. There is mild   left ventricular hypertrophy. Left ventricular diastolic parameters are  indeterminate.   2. Right ventricular systolic function is normal. The right ventricular  size is normal. Tricuspid regurgitation signal is inadequate for assessing  PA pressure.   3. Left atrial size was moderately dilated.   4. Right atrial size was mildly dilated.   5. The mitral valve is normal in structure. Mild to moderate mitral valve  regurgitation. No evidence of mitral stenosis.   6. The aortic valve is normal in structure. Aortic valve regurgitation is  mild to moderate. Aortic valve sclerosis/calcification is present, without  any evidence of aortic stenosis.   7. Pulmonic valve regurgitation is  moderate.   8. Aortic dilatation noted. There is mild dilatation of the aortic root,  measuring 39 mm.   9. The inferior vena cava is normal in size with greater than 50%  respiratory variability, suggesting right atrial pressure of 3 mmHg.   Laboratory Data:  High Sensitivity Troponin:   Recent Labs  Lab 01/06/23 0034 01/06/23 0333  TROPONINIHS 93* 84*     Chemistry Recent Labs  Lab 01/06/23 0034 01/06/23 0035  NA  --  137  K  --  4.4  CL  --  95*  CO2  --  24  GLUCOSE  --  184*  BUN  --  23*  CREATININE  --  1.21*  CALCIUM  --  11.2*  MG 2.4  --   GFRNONAA  --  52*  ANIONGAP  --  18*    Recent Labs  Lab 01/06/23 0035  PROT 8.3*  ALBUMIN 3.4*  AST 26  ALT 16  ALKPHOS 53  BILITOT 1.5*   Lipids No results for input(s): "CHOL", "TRIG", "HDL", "LABVLDL", "LDLCALC", "CHOLHDL" in the last 168 hours.  Hematology Recent Labs  Lab 01/06/23 0035  WBC 4.2  RBC 6.64*  HGB 16.7*  HCT 52.9*  MCV 79.7*  MCH 25.2*  MCHC 31.6  RDW 18.4*  PLT 196   Thyroid  Recent Labs  Lab 01/06/23 0034  TSH 0.689    BNP Recent Labs  Lab 01/06/23 0034  BNP 553.3*    DDimer No results for input(s): "DDIMER" in the last 168 hours.   Radiology/Studies:  ECHOCARDIOGRAM COMPLETE  Result Date: 01/06/2023    ECHOCARDIOGRAM REPORT   Patient Name:   [name] [facility] Date of Exam: 01/06/2023 Medical Rec #:  [id number]             Height:       66.0 in Accession #:    [id number]            Weight:       208.3 lb Date of Birth:  [birth year]            BSA:          2.035 m Patient Age:    56 years              BP:  152/100 mmHg Patient Gender: F                     HR:           83 bpm. Exam Location:  [facility] Procedure: 2D Echo, Cardiac Doppler and Color Doppler Indications:     CHF  History:         Patient has prior history of Echocardiogram examinations, most                  recent 11/13/2019. CHF; Risk Factors:Hypertension and Diabetes.  Sonographer:     Wenda Low  Referring Phys:  [id number] Athena Masse Diagnosing Phys: Kathlyn Sacramento MD  Sonographer Comments: Patient is obese. IMPRESSIONS  1. Left ventricular ejection fraction, by estimation, is 35 to 40%. The left ventricle has moderately decreased function. The left ventricle demonstrates global hypokinesis. The left ventricular internal cavity size was moderately dilated. There is mild  left ventricular hypertrophy. Left ventricular diastolic parameters are indeterminate.  2. Right ventricular systolic function is normal. The right ventricular size is normal. Tricuspid regurgitation signal is inadequate for assessing PA pressure.  3. Left atrial size was moderately dilated.  4. Right atrial size was mildly dilated.  5. The mitral valve is normal in structure. Mild to moderate mitral valve regurgitation. No evidence of mitral stenosis.  6. The aortic valve is normal in structure. Aortic valve regurgitation is mild to moderate. Aortic valve sclerosis/calcification is present, without any evidence of aortic stenosis.  7. Pulmonic valve regurgitation is moderate.  8. Aortic dilatation noted. There is mild dilatation of the aortic root, measuring 39 mm.  9. The inferior vena cava is normal in size with greater than 50% respiratory variability, suggesting right atrial pressure of 3 mmHg. FINDINGS  Left Ventricle: Left ventricular ejection fraction, by estimation, is 35 to 40%. The left ventricle has moderately decreased function. The left ventricle demonstrates global hypokinesis. The left ventricular internal cavity size was moderately dilated. There is mild left ventricular hypertrophy. Left ventricular diastolic parameters are indeterminate. Right Ventricle: The right ventricular size is normal. No increase in right ventricular wall thickness. Right ventricular systolic function is normal. Tricuspid regurgitation signal is inadequate for assessing PA pressure. Left Atrium: Left atrial size was moderately dilated. Right  Atrium: Right atrial size was mildly dilated. Pericardium: There is no evidence of pericardial effusion. Mitral Valve: The mitral valve is normal in structure. Mild mitral annular calcification. Mild to moderate mitral valve regurgitation. No evidence of mitral valve stenosis. MV peak gradient, 2.7 mmHg. The mean mitral valve gradient is 1.0 mmHg. Tricuspid Valve: The tricuspid valve is normal in structure. Tricuspid valve regurgitation is trivial. No evidence of tricuspid stenosis. Aortic Valve: The aortic valve is normal in structure. Aortic valve regurgitation is mild to moderate. Aortic regurgitation PHT measures 918 msec. Aortic valve sclerosis/calcification is present, without any evidence of aortic stenosis. Aortic valve mean  gradient measures 4.0 mmHg. Aortic valve peak gradient measures 9.6 mmHg. Aortic valve area, by VTI measures 2.71 cm. Pulmonic Valve: The pulmonic valve was normal in structure. Pulmonic valve regurgitation is moderate. No evidence of pulmonic stenosis. Aorta: Aortic dilatation noted. There is mild dilatation of the aortic root, measuring 39 mm. Venous: The inferior vena cava is normal in size with greater than 50% respiratory variability, suggesting right atrial pressure of 3 mmHg. IAS/Shunts: There is right bowing of the interatrial septum, suggestive of elevated left atrial pressure. No atrial level shunt detected by color flow  Doppler.  LEFT VENTRICLE PLAX 2D LVIDd:         6.40 cm   Diastology LVIDs:         5.40 cm   LV e' medial:    6.09 cm/s LV PW:         1.60 cm   LV E/e' medial:  9.8 LV IVS:        1.30 cm   LV e' lateral:   5.44 cm/s LVOT diam:     2.20 cm   LV E/e' lateral: 11.0 LV SV:         64 LV SV Index:   31 LVOT Area:     3.80 cm  RIGHT VENTRICLE RV Basal diam:  4.10 cm RV Mid diam:    3.90 cm RV S prime:     7.51 cm/s TAPSE (M-mode): 2.2 cm LEFT ATRIUM              Index        RIGHT ATRIUM           Index LA diam:        3.90 cm  1.92 cm/m   RA Area:     23.20  cm LA Vol (A2C):   125.0 ml 61.44 ml/m  RA Volume:   72.60 ml  35.68 ml/m LA Vol (A4C):   67.2 ml  33.03 ml/m LA Biplane Vol: 102.0 ml 50.13 ml/m  AORTIC VALVE                    PULMONIC VALVE AV Area (Vmax):    3.21 cm     PV Vmax:       1.00 m/s AV Area (Vmean):   3.19 cm     PV Peak grad:  4.0 mmHg AV Area (VTI):     2.71 cm AV Vmax:           155.00 cm/s AV Vmean:          86.600 cm/s AV VTI:            0.236 m AV Peak Grad:      9.6 mmHg AV Mean Grad:      4.0 mmHg LVOT Vmax:         131.00 cm/s LVOT Vmean:        72.600 cm/s LVOT VTI:          0.168 m LVOT/AV VTI ratio: 0.71 AI PHT:            918 msec  AORTA Ao Root diam: 3.90 cm Ao Asc diam:  3.10 cm MITRAL VALVE MV Area (PHT): 4.74 cm    SHUNTS MV Area VTI:   3.16 cm    Systemic VTI:  0.17 m MV Peak grad:  2.7 mmHg    Systemic Diam: 2.20 cm MV Mean grad:  1.0 mmHg MV Vmax:       0.83 m/s MV Vmean:      51.3 cm/s MV Decel Time: 160 msec MV E velocity: 59.90 cm/s Lorine Bears MD Electronically signed by Lorine Bears MD Signature Date/Time: 01/06/2023/10:52:26 AM    Final    DG Abdomen Acute W/Chest  Result Date: 01/06/2023 CLINICAL DATA:  Vomiting EXAM: DG ABDOMEN ACUTE WITH 1 VIEW CHEST COMPARISON:  Chest x-ray 11/13/2019 FINDINGS: There is no evidence of dilated bowel loops or free intraperitoneal air. No radiopaque calculi or other significant radiographic abnormality is seen. Heart size and mediastinal contours are within normal limits. Both  lungs are clear. IMPRESSION: Negative abdominal radiographs.  No acute cardiopulmonary disease. Electronically Signed   By: Rolm Baptise M.D.   On: 01/06/2023 02:05     Assessment and Plan:   Intractable vomiting/dysphagia/lactic acidosis/anion gap metabolic acidosis/starvation ketosis -Lactic acid 3.8 with anion gap of 18 -Likely related to dehydration from poor oral intake -Is just recently been started on clear liquid diet according to HPI she has the inability to swallow solid foods but has  been able to tolerate liquids without incident -Continued on IV hydration -GI has been consulted to evaluate for an upper endoscopy related to dysphagia x 1 month   Acute HFrEF  -Echocardiogram completed with an LVEF 35-40% -decreased LVEF likely secondary to new onset atrial fibrillation -BNP 553.3 -Patient states she was hospitalized a while back and was told that she had heart failure -Last echocardiogram done in 2020 showed normal EF -Heart failure education -Escalate GDMT once patient is tolerating oral medications -Patient states she previously had COVID and the flu when she originally got sick -Daily weights, I's and O's, low-sodium diet  New onset atrial fibrillation -Rate controlled with a high burden of aberrancy/PVC noted on telemetry -CHA2DS2-VASc score of at least 4 -Currently not on any anticoagulant therapy awaiting GI procedure to be completed for dysphagia -Continue with cardiac monitoring -Recommend heparin drip transitioning to [place] prior to discharge due to the inability of patient to swallow pills at this time  Elevated high-sensitivity troponin likely demand ischemia secondary to hypertensive urgency, lactic acidosis, elevated BNP -High-sensitivity troponin trended 93 and 84 -Currently remains chest pain-free -No ischemic changes noted on EKG -Continue with telemetry monitoring -EKG as needed for pain or changes  Hypertensive urgency -Blood pressure 175/91 -As needed hydralazine ordered every 6 hours until able to take oral medications -No doses have yet been given -Vital signs per unit protocol  AKI -Serum creatinine 1.21 -Daily BMP -Monitor urine output -Monitor/trend/replete electrolytes as needed -Avoid nephrotoxic agents were able  Type 2 diabetes -Continue with sliding scale coverage -Management per IM   Risk Assessment/Risk Scores:      CHA2DS2-VASc Score = 4   This indicates a 4.8% annual risk of stroke. The patient's score is based  upon: CHF History: 1 HTN History: 1 Diabetes History: 1 Stroke History: 0 Vascular Disease History: 0 Age Score: 0 Gender Score: 1       [name]'s perioperative risk of a major cardiac event is 6.6% according to the Revised Cardiac Risk Index (RCRI).  Therefore, she is at high risk for perioperative complications.   Her functional capacity is poor at 4.64 METs according to the Duke Activity Status Index (DASI). Recommendations: According to ACC/AHA guidelines, no further cardiovascular testing needed.  The patient may proceed to surgery at acceptable risk.   Antiplatelet and/or Anticoagulation Recommendations:  Can hold heparin prior to procedure.       For questions or updates, please contact [place] Please consult [email or web address] for contact info under    Signed, Kekoa Fyock, NP  01/06/2023 2:04 PM

## 2023-01-06 NOTE — ED Notes (Signed)
Gave pt the white patients phone to call Hetty Blend, her son. [phone]

## 2023-01-06 NOTE — Progress Notes (Addendum)
2018 patient alert x4 able to make all needs known on room air heparin gtt at 70ml hr 2250 heparin level hemolyzed lab will have to recollect    01/07/23 1245 bladder scanned for no urine 142ml in bladder,PRN BP meds given

## 2023-01-06 NOTE — Assessment & Plan Note (Signed)
IV Rocephin °Follow cultures °

## 2023-01-06 NOTE — ED Triage Notes (Signed)
Pt reports "being sick" for approx three weeks with vomiting and lack of appetite. Denies diarrhea. Pt alert and oriented at this time.

## 2023-01-06 NOTE — Assessment & Plan Note (Addendum)
Dysphagia Lactic acidosis/anion gap metabolic acidosis/starvation ketosis Lactic acid 3.8 with anion gap of 18 and beta hydroxybutyric acid of 1.53.   Likely related to dehydration from poor oral intake Will keep n.p.o. IV hydration, IV antiemetics GI consult to evaluate for endoscopy

## 2023-01-06 NOTE — ED Provider Notes (Signed)
[facility] Provider Note    Event Date/Time   First MD Initiated Contact with Patient 01/06/23 0036     (approximate)   History   Cannot eat anything  HPI  [name] is a 57 y.o. female history of congestive heart failure diabetes and hypertension  Patient tells me that she is here because she cannot keep any food on her stomach.  For about 45 days she can only drink things like soups and juices and water, but anything solid that she eats she will vomit up within about 10 minutes.  She states it does not feel like things get stuck in her chest, but rather she eats something and [place] her stomach and she will throw up almost anything solid.  She is only able to keep liquids.  She is lost over 25 pounds in the last 45 days.  Nothing hurts no pain.  She has not been able to tolerate swallowing any of her medications for at least the last week.  Has not taken any medication for a week  She has no chest pain.  No shortness of breath.  Does have an occasional cough which is not unusual   Review of medical records seem to indicate the patient has had multiple no-show appointments recently.  The last note I see is from March 2021 and this note seems quite abbreviated to notes history of admissions due to hypertension and acute on chronic heart failure  Physical Exam   Triage Vital Signs: ED Triage Vitals  Enc Vitals Group     BP 01/06/23 0029 (!) 189/122     Pulse Rate 01/06/23 0025 78     Resp 01/06/23 0025 19     Temp 01/06/23 0025 97.7 F (36.5 C)     Temp Source 01/06/23 0025 Oral     SpO2 01/06/23 0025 98 %     Weight 01/06/23 0036 208 lb 4.8 oz (94.5 kg)     Height 01/06/23 0028 5\' 6"  (1.676 m)     Head Circumference --      Peak Flow --      Pain Score 01/06/23 0028 0     Pain Loc --      Pain Edu? --      Excl. in [place]? --     Most recent vital signs: Vitals:   01/06/23 0025 01/06/23 0029  BP:  (!) 189/122  Pulse: 78   Resp: 19    Temp: 97.7 F (36.5 C)   SpO2: 98%      General: Awake, no distress.  Appears fatigued though.  Needs assistance with 1 hand to get from wheelchair to bed.  She appears fatigued.  Able to sit up on her own and hold her by a body weight, but very fatigued. CV:  Good peripheral perfusion.  Strong maximal impact point of her pulsation in the chest.  She does not have any noted murmurs or is irregularity Resp:  Normal effort.  Clear bilateral.  She does have an occasional cough but is nonproductive.  No wheezing she speaks in full clear sentences Abd:  No distention.  The abdomen is soft nontender and nondistended. Other:  Areas of skin around her arms and abdomen appears slightly loose, consistent with weight loss  Mucous membranes are moist   ED Results / Procedures / Treatments   Labs (all labs ordered are listed, but only abnormal results are displayed) Labs Reviewed  URINALYSIS, ROUTINE W REFLEX MICROSCOPIC -  Abnormal; Notable for the following components:      Result Value   Color, Urine YELLOW (*)    APPearance TURBID (*)    Hgb urine dipstick SMALL (*)    Ketones, ur 5 (*)    Protein, ur 100 (*)    Leukocytes,Ua MODERATE (*)    Bacteria, UA MANY (*)    Non Squamous Epithelial PRESENT (*)    All other components within normal limits  CBC WITH DIFFERENTIAL/PLATELET - Abnormal; Notable for the following components:   RBC 6.64 (*)    Hemoglobin 16.7 (*)    HCT 52.9 (*)    MCV 79.7 (*)    MCH 25.2 (*)    RDW 18.4 (*)    nRBC 0.5 (*)    All other components within normal limits  COMPREHENSIVE METABOLIC PANEL - Abnormal; Notable for the following components:   Chloride 95 (*)    Glucose, Bld 184 (*)    BUN 23 (*)    Creatinine, Ser 1.21 (*)    Calcium 11.2 (*)    Total Protein 8.3 (*)    Albumin 3.4 (*)    Total Bilirubin 1.5 (*)    GFR, Estimated 52 (*)    Anion gap 18 (*)    All other components within normal limits  BRAIN NATRIURETIC PEPTIDE - Abnormal; Notable  for the following components:   B Natriuretic Peptide 553.3 (*)    All other components within normal limits  CK - Abnormal; Notable for the following components:   Total CK 22 (*)    All other components within normal limits  LACTIC ACID, PLASMA - Abnormal; Notable for the following components:   Lactic Acid, Venous 3.8 (*)    All other components within normal limits  BETA-HYDROXYBUTYRIC ACID - Abnormal; Notable for the following components:   Beta-Hydroxybutyric Acid 1.53 (*)    All other components within normal limits  CBG MONITORING, ED - Abnormal; Notable for the following components:   Glucose-Capillary 185 (*)    All other components within normal limits  TROPONIN I (HIGH SENSITIVITY) - Abnormal; Notable for the following components:   Troponin I (High Sensitivity) 93 (*)    All other components within normal limits  RESP PANEL BY RT-PCR (FLU A&B, COVID) [id number]  URINE CULTURE  TSH  MAGNESIUM  TROPONIN I (HIGH SENSITIVITY)     EKG  Interpreted by me at 0031 Heart rate 110 QRS 130 QTc 430 Underlying rhythm is not quite certain, may represent A-fib as there is some irregularity although occasional areas of what appear to be P waves are noted, but overall I suspect underlying rhythm is atrial fibrillation with mild RVR.  There is no acute ischemic abnormality.  There is evidence that suggest probable underlying left ventricular hypertrophy   RADIOLOGY  Abdominal plain films interpreted as negative for acute   PROCEDURES:  Critical Care performed: No  Procedures   MEDICATIONS ORDERED IN ED: Medications  ondansetron (ZOFRAN) injection 4 mg (4 mg Intravenous Given 01/06/23 0117)  sodium chloride 0.9 % bolus 500 mL (0 mLs Intravenous Stopped 01/06/23 0300)  cefTRIAXone (ROCEPHIN) 1 g in sodium chloride 0.9 % 100 mL IVPB (0 g Intravenous Stopped 01/06/23 0300)  hydrALAZINE (APRESOLINE) injection 5 mg (5 mg Intravenous Given 01/06/23 0327)     IMPRESSION / MDM /  ASSESSMENT AND PLAN / ED COURSE  I reviewed the triage vital signs and the nursing notes.  Differential diagnosis includes, but is not limited to, possible underlying gastrointestinal illness, obstructive pathology, bezoar, impaction, electrolyte abnormality, congestive heart failure, etc.  She denies chest pain no shortness of breath.  She has not been able to take her medications now for at least a week or more because she cannot tolerate it and only able to tolerate liquids.  Do based on her clinical history believe she has lost notable weight and for some reason has been unable to tolerate solid foods for over 45 days.  She has no acute abdomen.  She her abdomen is soft nontender nondistended.  Notable that the patient previously weighed approximately 119 kg on her previous ED office visit and she endorses that she has lost at least 25 pounds in the last month and a half.  Today she weighs 94 kg.  She has lost approximately 50 pounds of weight and this appears to be unintentional  EKG appears to demonstrate new atrial fibrillation, with relative rate control.  No ischemic changes.  Query possible underlying electrolyte abnormality in the setting of unable to tolerate by mouth.  Awaiting labs.  Dr. Harrell Gave of cardiology reviewed EKG.  Labs are notable for an elevated lactic acid.  Hemodynamics are stable but hypertension.  Will give IV hydralazine as the patient reports unable to take oral medications by mouth.  Also somewhat suspicious about potential dehydration but she is not tachycardic.  Will need further workup under the hospitalist system as to causation though there is evidence of an associated urinary tract infection but she does not meet sepsis criteria at present.  Patient's presentation is most consistent with acute complicated illness / injury requiring diagnostic workup.   The patient is on the cardiac monitor to evaluate for evidence of arrhythmia  and/or significant heart rate changes.  Labs notable for multiple metabolic abnormalities include mild AKI hypercalcemia elevated anion gap with elevated lactic acid as well as beta hydroxybutyrate.  Normal CK.  Troponin minimally elevated at 93.  EKG without evidence of acute ischemia and she has no chest pain likely representing some sort of demand.  Additionally has what appears to be new onset atrial fibrillation.  Discussed with Dr. Damita Dunnings her hospitalist will admit the patient.  I think the patient would benefit from certainly further workup under the internal medicine team as the causation of her presentation, inability to tolerate by mouth, I placed a consult with GI as well Dr. Virgina Jock.       FINAL CLINICAL IMPRESSION(S) / ED DIAGNOSES   Final diagnoses:  New onset atrial fibrillation ([place])  Weight loss  Dysphagia, unspecified type     Rx / DC Orders   ED Discharge Orders     None        Note:  This document was prepared using Dragon voice recognition software and may include unintentional dictation errors.   Delman Kitten, MD 01/06/23 [phone]

## 2023-01-06 NOTE — Progress Notes (Signed)
PROGRESS NOTE  [name] OAC:[id number] DOB: 1966-07-05 DOA: 01/06/2023 PCP: Kerri Perches, [facility] Course/Subjective: [name] is a 57 y.o. female with medical history significant for Diabetes and high blood pressure, as well as hospitalization in 2020 for hypertensive emergency, who presents to the ED with a several week history of postprandial vomiting, difficulty swallowing down solid food, and has been limited to liquid diet with soups, juices and water,.  She has been unable to hold on her medication.    Seen this AM resting comfortably on IVF, NPO. Awaiting GI consult.  Assessment/Plan: * Intractable vomiting Dysphagia Lactic acidosis/anion gap metabolic acidosis/starvation ketosis Lactic acid 3.8 with anion gap of 18 and beta hydroxybutyric acid of 1.53.   Recheck Lactate this AM. Likely related to dehydration from poor oral intake Will keep n.p.o. IV hydration, IV antiemetics GI consult to evaluate, possible endoscopy.  Urinary tract infection IV Rocephin daily Follow cultures  AKI (acute kidney injury) ([place]) Secondary to poor oral intake IV hydration and monitor, recheck Cr now  Unintentional weight loss Secondary to poor oral intake and with some concern for underlying occult process BMI change from above 40>33 in 2 years Patient not up-to-date with age-related cancer screenings  Hypercalcemia Calcium elevated at 11.2 Hydrate and monitor Further diagnostic evaluation if not corrected  Hypertensive urgency BP 189/122 on arrival Likely related to inability to take oral meds, versus noncompliance.  History of hospitalization in 2020 with hypertensive urgency and acute diastolic CHF Hydralazine IV every 6 until able to tolerate her PO meds  Elevated troponin Troponin elevated at 93 with BNP 553, trending down Patient clinically dry and chest x-ray nonacute Likely secondary to demand ischemia from elevated blood pressure,  dehydration Continue to trend Echocardiogram to evaluate for focal wall motion abnormality in view of risk factors  Diabetes mellitus without complication (HCC) Sliding scale insulin coverage  DVT Prophylaxis: Lovenox  Code Status: FULL   Family Communication: None present   Disposition Plan: Inpatient, likely home at discharge.  Consultants: GI   Procedures: None   Antimicrobials: Anti-infectives (From admission, onward)    Start     Dose/Rate Route Frequency Ordered Stop   01/06/23 0745  cefTRIAXone (ROCEPHIN) 1 g in sodium chloride 0.9 % 100 mL IVPB        1 g 200 mL/hr over 30 Minutes Intravenous Every 24 hours 01/06/23 0738 01/10/23 0744   01/06/23 0130  cefTRIAXone (ROCEPHIN) 1 g in sodium chloride 0.9 % 100 mL IVPB        1 g 200 mL/hr over 30 Minutes Intravenous STAT 01/06/23 0126 01/06/23 0300       Objective: Vitals:   01/06/23 0500 01/06/23 0530 01/06/23 0534 01/06/23 0536  BP: (!) 166/108 (!) 168/95    Pulse: 74 82    Resp: 16 (!) 9    Temp:   98.2 F (36.8 C)   TempSrc:   Oral   SpO2: 97% 98%  98%  Weight:      Height:       No intake or output data in the 24 hours ending 01/06/23 0739 Filed Weights   01/06/23 0036  Weight: 94.5 kg   Exam: General:  Alert, oriented, calm, in no acute distress, resting in bed in ER Eyes: EOMI, clear sclerea Neck: supple, no masses, trachea mildline  Cardiovascular: RRR, no murmurs or rubs, no peripheral edema  Respiratory: clear to auscultation bilaterally, no wheezes, no crackles  Abdomen: soft, nontender, nondistended, normal bowel  tones heard  Skin: dry, no rashes  Musculoskeletal: no joint effusions, normal range of motion  Psychiatric: appropriate affect, normal speech  Neurologic: extraocular muscles intact, clear speech, moving all extremities with intact sensorium   Data Reviewed: CBC: Recent Labs  Lab 01/06/23 0035  WBC 4.2  NEUTROABS 2.7  HGB 16.7*  HCT 52.9*  MCV 79.7*  PLT 694    Basic Metabolic Panel: Recent Labs  Lab 01/06/23 0034 01/06/23 0035  NA  --  137  K  --  4.4  CL  --  95*  CO2  --  24  GLUCOSE  --  184*  BUN  --  23*  CREATININE  --  1.21*  CALCIUM  --  11.2*  MG 2.4  --    GFR: Estimated Creatinine Clearance: 59.4 mL/min (A) (by C-G formula based on SCr of 1.21 mg/dL (H)). Liver Function Tests: Recent Labs  Lab 01/06/23 0035  AST 26  ALT 16  ALKPHOS 53  BILITOT 1.5*  PROT 8.3*  ALBUMIN 3.4*   No results for input(s): "LIPASE", "AMYLASE" in the last 168 hours. No results for input(s): "AMMONIA" in the last 168 hours. Coagulation Profile: No results for input(s): "INR", "PROTIME" in the last 168 hours. Cardiac Enzymes: Recent Labs  Lab 01/06/23 0035  CKTOTAL 22*   BNP (last 3 results) No results for input(s): "PROBNP" in the last 8760 hours. HbA1C: No results for input(s): "HGBA1C" in the last 72 hours. CBG: Recent Labs  Lab 01/06/23 0033 01/06/23 0447  GLUCAP 185* 159*   Lipid Profile: No results for input(s): "CHOL", "HDL", "LDLCALC", "TRIG", "CHOLHDL", "LDLDIRECT" in the last 72 hours. Thyroid Function Tests: Recent Labs    01/06/23 0034  TSH 0.689   Anemia Panel: No results for input(s): "VITAMINB12", "FOLATE", "FERRITIN", "TIBC", "IRON", "RETICCTPCT" in the last 72 hours. Urine analysis:    Component Value Date/Time   COLORURINE YELLOW (A) 01/06/2023 0034   APPEARANCEUR TURBID (A) 01/06/2023 0034   APPEARANCEUR Cloudy 08/10/2013 1229   LABSPEC 1.019 01/06/2023 0034   LABSPEC 1.024 08/10/2013 1229   PHURINE 5.0 01/06/2023 0034   GLUCOSEU NEGATIVE 01/06/2023 0034   GLUCOSEU Negative 08/10/2013 1229   HGBUR SMALL (A) 01/06/2023 0034   BILIRUBINUR NEGATIVE 01/06/2023 0034   BILIRUBINUR Negative 08/10/2013 1229   KETONESUR 5 (A) 01/06/2023 0034   PROTEINUR 100 (A) 01/06/2023 0034   UROBILINOGEN 0.2 09/06/2010 1027   NITRITE NEGATIVE 01/06/2023 0034   LEUKOCYTESUR MODERATE (A) 01/06/2023 0034    LEUKOCYTESUR 3+ 08/10/2013 1229   Sepsis Labs: @LABRCNTIP (procalcitonin:4,lacticidven:4)  ) Recent Results (from the past 240 hour(s))  Resp Panel by RT-PCR (Flu A&B, Covid) Anterior Nasal Swab     Status: None   Collection Time: 01/06/23  1:17 AM   Specimen: Anterior Nasal Swab  Result Value Ref Range Status   SARS Coronavirus 2 by RT PCR NEGATIVE NEGATIVE Final    Comment: (NOTE) SARS-CoV-2 target nucleic acids are NOT DETECTED.  The SARS-CoV-2 RNA is generally detectable in upper respiratory specimens during the acute phase of infection. The lowest concentration of SARS-CoV-2 viral copies this assay can detect is 138 copies/mL. A negative result does not preclude SARS-Cov-2 infection and should not be used as the sole basis for treatment or other patient management decisions. A negative result may occur with  improper specimen collection/handling, submission of specimen other than nasopharyngeal swab, presence of viral mutation(s) within the areas targeted by this assay, and inadequate number of viral copies(<138 copies/mL). A negative result  must be combined with clinical observations, patient history, and epidemiological information. The expected result is Negative.  Fact Sheet for Patients:  EntrepreneurPulse.com.au  Fact Sheet for Healthcare Providers:  IncredibleEmployment.be  This test is no t yet approved or cleared by the [place] FDA and  has been authorized for detection and/or diagnosis of SARS-CoV-2 by FDA under an Emergency Use Authorization (EUA). This EUA will remain  in effect (meaning this test can be used) for the duration of the COVID-19 declaration under Section 564(b)(1) of the Act, 21 U.S.C.section 360bbb-3(b)(1), unless the authorization is terminated  or revoked sooner.       Influenza A by PCR NEGATIVE NEGATIVE Final   Influenza B by PCR NEGATIVE NEGATIVE Final    Comment: (NOTE) The Xpert Xpress  SARS-CoV-2/FLU/RSV plus assay is intended as an aid in the diagnosis of influenza from Nasopharyngeal swab specimens and should not be used as a sole basis for treatment. Nasal washings and aspirates are unacceptable for Xpert Xpress SARS-CoV-2/FLU/RSV testing.  Fact Sheet for Patients: EntrepreneurPulse.com.au  Fact Sheet for Healthcare Providers: IncredibleEmployment.be  This test is not yet approved or cleared by the [place] FDA and has been authorized for detection and/or diagnosis of SARS-CoV-2 by FDA under an Emergency Use Authorization (EUA). This EUA will remain in effect (meaning this test can be used) for the duration of the COVID-19 declaration under Section 564(b)(1) of the Act, 21 U.S.C. section 360bbb-3(b)(1), unless the authorization is terminated or revoked.  Performed at [facility], [address] [place], [place] [address]      Studies: DG Abdomen Acute W/Chest  Result Date: 01/06/2023 CLINICAL DATA:  Vomiting EXAM: DG ABDOMEN ACUTE WITH 1 VIEW CHEST COMPARISON:  Chest x-ray 11/13/2019 FINDINGS: There is no evidence of dilated bowel loops or free intraperitoneal air. No radiopaque calculi or other significant radiographic abnormality is seen. Heart size and mediastinal contours are within normal limits. Both lungs are clear. IMPRESSION: Negative abdominal radiographs.  No acute cardiopulmonary disease. Electronically Signed   By: Rolm Baptise M.D.   On: 01/06/2023 02:05    Scheduled Meds:  enoxaparin (LOVENOX) injection  40 mg Subcutaneous Q24H   insulin aspart  0-15 Units Subcutaneous Q4H    Continuous Infusions:  cefTRIAXone (ROCEPHIN)  IV     lactated ringers 100 mL/hr at 01/06/23 0458     LOS: 0 days   Time spent: 31 minutes  Alesha Jaffee Neva Seat, MD [facility] Pager [phone]  If 7PM-7AM, please contact night-coverage [email or web address] Password [facility] 01/06/2023, 7:39 AM

## 2023-01-06 NOTE — Progress Notes (Signed)
*  PRELIMINARY RESULTS* Echocardiogram 2D Echocardiogram has been performed.  [name] 01/06/2023, 10:35 AM

## 2023-01-06 NOTE — H&P (Signed)
History and Physical    Patient: [name] NUU:[id number] DOB: 1966/08/16 DOA: 01/06/2023 DOS: the patient was seen and examined on 01/06/2023 PCP: Kerri Perches, PA-C  Patient coming from: Home  Chief Complaint: No chief complaint on file.   HPI: [name] is a 57 y.o. female with medical history significant for Diabetes and high blood pressure, as well as hospitalization in 2020 for hypertensive emergency, who presents to the ED with a several week history of postprandial vomiting, difficulty swallowing down solid food, and has been limited to liquid diet with soups, juices and water,.  She has been unable to hold on her medication.  She denies abdominal pain, fever or chills and denies diarrhea.  Denies chest pain or shortness of breath though she endorses a congested cough.  She has noted significant weight loss over 25 pounds in the last 2 months.  She has never had upper or lower endoscopy. ED course and data review: BP 189/122 on arrival with otherwise normal vitals.  WBC 4200 with hemoglobin 16.7.  Labs otherwise significant for creatinine 1.21 up from baseline of 0.9 with anion gap of 18, total bili 1.5 with normal LFTs.  Beta hydroxybutyric acid of 1.53.  Lactic acid 3.8.  Calcium elevated at 11.2.  Troponin 93 and BNP 553.  Urinalysis with moderate leuks and many bacteria. EKG, personally viewed and interpreted showing sinus tachycardia at 112 with no acute ST-T wave changes. Chest and abdominal x-rays show "Negative abdominal radiographs. No acute cardiopulmonary disease." Patient was treated with an NS bolus 1 L, started on ceftriaxone for UTI as well as Zofran. Hospitalist consulted for admission.   Review of Systems: As mentioned in the history of present illness. All other systems reviewed and are negative.  Past Medical History:  Diagnosis Date   CHF (congestive heart failure) (HCC)    Diabetes mellitus without complication ([place])    Hypertension     History reviewed. No pertinent surgical history. Social History:  reports that she has never smoked. She has never used smokeless tobacco. She reports current alcohol use. She reports that she does not use drugs.  No Known Allergies  History reviewed. No pertinent family history.  Prior to Admission medications   Medication Sig Start Date End Date Taking? Authorizing Provider  amLODipine (NORVASC) 5 MG tablet Take 2 tablets (10 mg total) by mouth daily. 12/15/19   Lavonia Drafts, MD  carvedilol (COREG) 6.25 MG tablet Take 1 tablet (6.25 mg total) by mouth 2 (two) times daily with a meal. 11/15/19   Loletha Grayer, MD  furosemide (LASIX) 40 MG tablet Take 1 tablet (40 mg total) by mouth daily. 11/15/19   Loletha Grayer, MD  hydrALAZINE (APRESOLINE) 25 MG tablet Take 1 tablet (25 mg total) by mouth 3 (three) times daily. Patient taking differently: Take 50 mg by mouth 3 (three) times daily.  12/15/19 02/01/20  Carrie Mew, MD  lisinopril (ZESTRIL) 40 MG tablet Take 1 tablet (40 mg total) by mouth daily. 12/15/19   Lavonia Drafts, MD  metFORMIN (GLUCOPHAGE) 500 MG tablet Take 1 tablet (500 mg total) by mouth 2 (two) times daily with a meal. 11/15/19   Loletha Grayer, MD    Physical Exam: Vitals:   01/06/23 0025 01/06/23 0028 01/06/23 0029 01/06/23 0036  BP:   (!) 189/122   Pulse: 78     Resp: 19     Temp: 97.7 F (36.5 C)     TempSrc: Oral     SpO2: 98%  Weight:    94.5 kg  Height:  5\' 6"  (1.676 m)     Physical Exam Vitals and nursing note reviewed.  Constitutional:      General: She is not in acute distress. HENT:     Head: Normocephalic and atraumatic.  Cardiovascular:     Rate and Rhythm: Normal rate and regular rhythm.     Heart sounds: Normal heart sounds.  Pulmonary:     Effort: Pulmonary effort is normal.     Breath sounds: Normal breath sounds.  Abdominal:     Palpations: Abdomen is soft.     Tenderness: There is no abdominal tenderness.  Neurological:      Mental Status: Mental status is at baseline.     Labs on Admission: I have personally reviewed following labs and imaging studies  CBC: Recent Labs  Lab 01/06/23 0035  WBC 4.2  NEUTROABS 2.7  HGB 16.7*  HCT 52.9*  MCV 79.7*  PLT 161   Basic Metabolic Panel: Recent Labs  Lab 01/06/23 0034 01/06/23 0035  NA  --  137  K  --  4.4  CL  --  95*  CO2  --  24  GLUCOSE  --  184*  BUN  --  23*  CREATININE  --  1.21*  CALCIUM  --  11.2*  MG 2.4  --    GFR: Estimated Creatinine Clearance: 59.4 mL/min (A) (by C-G formula based on SCr of 1.21 mg/dL (H)). Liver Function Tests: Recent Labs  Lab 01/06/23 0035  AST 26  ALT 16  ALKPHOS 53  BILITOT 1.5*  PROT 8.3*  ALBUMIN 3.4*   No results for input(s): "LIPASE", "AMYLASE" in the last 168 hours. No results for input(s): "AMMONIA" in the last 168 hours. Coagulation Profile: No results for input(s): "INR", "PROTIME" in the last 168 hours. Cardiac Enzymes: Recent Labs  Lab 01/06/23 0035  CKTOTAL 22*   BNP (last 3 results) No results for input(s): "PROBNP" in the last 8760 hours. HbA1C: No results for input(s): "HGBA1C" in the last 72 hours. CBG: Recent Labs  Lab 01/06/23 0033  GLUCAP 185*   Lipid Profile: No results for input(s): "CHOL", "HDL", "LDLCALC", "TRIG", "CHOLHDL", "LDLDIRECT" in the last 72 hours. Thyroid Function Tests: Recent Labs    01/06/23 0034  TSH 0.689   Anemia Panel: No results for input(s): "VITAMINB12", "FOLATE", "FERRITIN", "TIBC", "IRON", "RETICCTPCT" in the last 72 hours. Urine analysis:    Component Value Date/Time   COLORURINE YELLOW (A) 01/06/2023 0034   APPEARANCEUR TURBID (A) 01/06/2023 0034   APPEARANCEUR Cloudy 08/10/2013 1229   LABSPEC 1.019 01/06/2023 0034   LABSPEC 1.024 08/10/2013 1229   PHURINE 5.0 01/06/2023 0034   GLUCOSEU NEGATIVE 01/06/2023 0034   GLUCOSEU Negative 08/10/2013 1229   HGBUR SMALL (A) 01/06/2023 0034   BILIRUBINUR NEGATIVE 01/06/2023 0034    BILIRUBINUR Negative 08/10/2013 1229   KETONESUR 5 (A) 01/06/2023 0034   PROTEINUR 100 (A) 01/06/2023 0034   UROBILINOGEN 0.2 09/06/2010 1027   NITRITE NEGATIVE 01/06/2023 0034   LEUKOCYTESUR MODERATE (A) 01/06/2023 0034   LEUKOCYTESUR 3+ 08/10/2013 1229    Radiological Exams on Admission: DG Abdomen Acute W/Chest  Result Date: 01/06/2023 CLINICAL DATA:  Vomiting EXAM: DG ABDOMEN ACUTE WITH 1 VIEW CHEST COMPARISON:  Chest x-ray 11/13/2019 FINDINGS: There is no evidence of dilated bowel loops or free intraperitoneal air. No radiopaque calculi or other significant radiographic abnormality is seen. Heart size and mediastinal contours are within normal limits. Both lungs are clear. IMPRESSION:  Negative abdominal radiographs.  No acute cardiopulmonary disease. Electronically Signed   By: Rolm Baptise M.D.   On: 01/06/2023 02:05     Data Reviewed: Relevant notes from primary care and specialist visits, past discharge summaries as available in EHR, including Care Everywhere. Prior diagnostic testing as pertinent to current admission diagnoses Updated medications and problem lists for reconciliation ED course, including vitals, labs, imaging, treatment and response to treatment Triage notes, nursing and pharmacy notes and ED provider's notes Notable results as noted in HPI   Assessment and Plan: * Intractable vomiting Dysphagia Lactic acidosis/anion gap metabolic acidosis/starvation ketosis Lactic acid 3.8 with anion gap of 18 and beta hydroxybutyric acid of 1.53.   Likely related to dehydration from poor oral intake Will keep n.p.o. IV hydration, IV antiemetics GI consult to evaluate for endoscopy  Urinary tract infection IV Rocephin Follow cultures  AKI (acute kidney injury) ([place]) Secondary to poor oral intake IV hydration and monitor  Unintentional weight loss Secondary to poor oral intake and with some concern for underlying occult process BMI change from above 40>33 in 2  years Patient not up-to-date with age-related cancer screenings  Hypercalcemia Calcium elevated at 11.2 Hydrate and monitor Further diagnostic evaluation if not corrected  Hypertensive urgency BP 189/122 on arrival Likely related to inability to take oral meds, versus noncompliance.  History of hospitalization in 2020 with hypertensive urgency and acute diastolic CHF Hydralazine IV every 6 until able to tolerate orally   Elevated troponin Troponin elevated at 93 with BNP 553 Patient clinically dry and chest x-ray nonacute Likely secondary to demand ischemia from elevated blood pressure, dehydration Continue to trend Echocardiogram to evaluate for focal wall motion abnormality in view of risk factors  Diabetes mellitus without complication (HCC) Sliding scale insulin coverage     DVT prophylaxis: Lovenox  Consults: Gi Dr Virgina Jock  Advance Care Planning:   Code Status: Prior   Family Communication: none  Disposition Plan: Back to previous home environment  Severity of Illness: The appropriate patient status for this patient is INPATIENT. Inpatient status is judged to be reasonable and necessary in order to provide the required intensity of service to ensure the patient's safety. The patient's presenting symptoms, physical exam findings, and initial radiographic and laboratory data in the context of their chronic comorbidities is felt to place them at high risk for further clinical deterioration. Furthermore, it is not anticipated that the patient will be medically stable for discharge from the hospital within 2 midnights of admission.   * I certify that at the point of admission it is my clinical judgment that the patient will require inpatient hospital care spanning beyond 2 midnights from the point of admission due to high intensity of service, high risk for further deterioration and high frequency of surveillance required.*  Author: Athena Masse, MD 01/06/2023 3:24 AM  For  on call review [email or web address].

## 2023-01-06 NOTE — Consult Note (Signed)
GI Inpatient Consult Note  Reason for Consult: nausea/vomiting  Attending Requesting Consult: Dr. Damita Dunnings  History of Present Illness: [name] is a 57 y.o. female seen for evaluation of nausea/vomiting at the request of Dr. Damita Dunnings.  PMHx of CHF, DM, HTN. Reports over the past 2-4 weeks developing issues w/ significant nausea/vomiting, she reports anytime she eats she will vomit bile/clear liquids with the food mixed in soon after the oral intake. She feels she is vomiting at least daily but sometimes several times a day. She denies any dysphagia, GERD, or epigastric pain. Feels she swallows foods well. She denies any med changes, etc when symptoms began. Reports was not taking any meds when symptoms started except ibuprofen daily for back pain and head aches.   She reports having some mild constipation with the vomiting likely due to poor PO intake. Reports occasional mild lower abd pain but no severe pain. No post prandial abd pain. She denies any rectal bleeding. No diarrhea. No dark stools.   She denies tobacco intake. She has very occasional alcohol intake.   No known family GI hx. Reports she has not been to the doctor in a few years. She lives with her son.    Last Colonoscopy: none prior Last Endoscopy: none prior   Past Medical History:  Past Medical History:  Diagnosis Date   CHF (congestive heart failure) ([place])    Diabetes mellitus without complication ([place])    Hypertension     Problem List: Patient Active Problem List   Diagnosis Date Noted   Hypercalcemia 01/06/2023   High anion gap metabolic acidosis 46/50/3546   Dysphagia 01/06/2023   Unintentional weight loss 01/06/2023   AKI (acute kidney injury) ([place]) 01/06/2023   Intractable vomiting 01/06/2023   Urinary tract infection 01/06/2023   Diabetes mellitus without complication ([place]) 56/81/2751   Lactic acidosis 01/06/2023   Elevated troponin 70/12/7492   Acute diastolic CHF (congestive heart  failure) (HCC)    Obesity, Class III, BMI 40-49.9 (morbid obesity) ([place])    Hypertensive urgency 11/13/2019    Past Surgical History: History reviewed. No pertinent surgical history.  Allergies: No Known Allergies  Home Medications: (Not in a hospital admission)  Home medication reconciliation was completed with the patient.   Scheduled Inpatient Medications:    enoxaparin (LOVENOX) injection  40 mg Subcutaneous Q24H   insulin aspart  0-15 Units Subcutaneous Q4H    Continuous Inpatient Infusions:    [START ON 01/07/2023] cefTRIAXone (ROCEPHIN)  IV     lactated ringers 100 mL/hr at 01/06/23 0458    PRN Inpatient Medications:  acetaminophen **OR** acetaminophen, hydrALAZINE, morphine injection, ondansetron **OR** ondansetron (ZOFRAN) IV  Family History: Denies any family history of colon polyps or colon cancer.  Social History:   reports that she has never smoked. She has never used smokeless tobacco. She reports current alcohol use. She reports that she does not use drugs.   Review of Systems: Constitutional: + weight loss Eyes: No changes in vision. ENT: No oral lesions, sore throat.  GI: see HPI.  Heme/Lymph: No easy bruising.  CV: No chest pain.  GU: No hematuria.  Integumentary: No rashes.  Neuro: No headaches.  Psych: No depression/anxiety.  Endocrine: No heat/cold intolerance.  Allergic/Immunologic: No urticaria.  Resp: No cough, SOB.  Musculoskeletal: No joint swelling.    Physical Examination: BP (!) 152/100   Pulse 91   Temp 98.3 F (36.8 C)   Resp 16   Ht 5\' 6"  (1.676 m)  Wt 94.5 kg   LMP 12/03/2022 (Approximate)   SpO2 97%   BMI 33.62 kg/m  Gen: NAD, alert and oriented x 4, sitting on side of bed  HEENT: PEERLA, EOMI, Neck: supple, no JVD or thyromegaly Chest: CTA bilaterally, no wheezes, crackles, or other adventitious sounds CV: RRR, no m/g/c/r Abd: soft, NT, ND, +BS in all four quadrants; no HSM, guarding, ridigity, or rebound  tenderness Ext: no edema, well perfused with 2+ pulses, Skin: no rash or lesions noted Lymph: no LAD  Data: Lab Results  Component Value Date   WBC 4.2 01/06/2023   HGB 16.7 (H) 01/06/2023   HCT 52.9 (H) 01/06/2023   MCV 79.7 (L) 01/06/2023   PLT 196 01/06/2023   Recent Labs  Lab 01/06/23 0035  HGB 16.7*   Lab Results  Component Value Date   NA 137 01/06/2023   K 4.4 01/06/2023   CL 95 (L) 01/06/2023   CO2 24 01/06/2023   BUN 23 (H) 01/06/2023   CREATININE 1.21 (H) 01/06/2023   Lab Results  Component Value Date   ALT 16 01/06/2023   AST 26 01/06/2023   ALKPHOS 53 01/06/2023   BILITOT 1.5 (H) 01/06/2023   No results for input(s): "APTT", "INR", "PTT" in the last 168 hours.  IMPRESSION: Negative abdominal radiographs.  No acute cardiopulmonary diseas  Assessment/Plan: Ms. Bacorn is a 57 y.o. female admitted for vomiting, dysphagia, and poor PO intake.  Intractable vomiting - ddx would include gastritis, PUD (has daily NSAID use), associated w/ weight loss of about #25lbs. Needs EGD for evaluation, will plan for today. Likely will need PPI after EGD. If EGD unremarkable would consider abd imaging.  AKI - related to dehydration from #1.  Unintentional wt loss-likely due to #1, ultimately will need colonoscopy as outpatient for evaluation but would not tolerate prep currently. Hypercalcemia HTN urgency - per primary team, not taking any BP meds at home. Improving since admission DM  Elevated lactic acid - improving after rehydration   Recommendations:  EGD today Hold AM Lovenox  NPO until procedure Continue supportive care per primary team   Case was discussed with Dr. Virgina Jock. Thank you for the consult. Please call with questions or concerns.  Ezzard Standing, PA-C [facility]

## 2023-01-07 ENCOUNTER — Encounter: Payer: Self-pay | Admitting: Internal Medicine

## 2023-01-07 ENCOUNTER — Inpatient Hospital Stay: Payer: Medicaid Other | Admitting: Certified Registered"

## 2023-01-07 ENCOUNTER — Encounter
Admission: EM | Disposition: A | Discharge status: Home / Self Care | Payer: Self-pay | Source: Home / Self Care | Attending: Student

## 2023-01-07 ENCOUNTER — Inpatient Hospital Stay: Payer: Medicaid Other

## 2023-01-07 DIAGNOSIS — I4819 Other persistent atrial fibrillation: Secondary | ICD-10-CM

## 2023-01-07 DIAGNOSIS — R1110 Vomiting, unspecified: Secondary | ICD-10-CM | POA: Diagnosis not present

## 2023-01-07 DIAGNOSIS — I5021 Acute systolic (congestive) heart failure: Secondary | ICD-10-CM

## 2023-01-07 DIAGNOSIS — I4891 Unspecified atrial fibrillation: Principal | ICD-10-CM | POA: Insufficient documentation

## 2023-01-07 HISTORY — PX: ESOPHAGOGASTRODUODENOSCOPY: SHX5428

## 2023-01-07 LAB — BASIC METABOLIC PANEL
Anion gap: 12 (ref 5–15)
BUN: 19 mg/dL (ref 6–20)
CO2: 25 mmol/L (ref 22–32)
Calcium: 9.7 mg/dL (ref 8.9–10.3)
Chloride: 98 mmol/L (ref 98–111)
Creatinine, Ser: 0.97 mg/dL (ref 0.44–1.00)
GFR, Estimated: >60 mL/min (ref >60)
Glucose, Bld: 82 mg/dL (ref 70–99)
Potassium: 4.3 mmol/L (ref 3.5–5.1)
Sodium: 135 mmol/L (ref 135–145)

## 2023-01-07 LAB — CBC
HCT: 46.1 % — ABNORMAL HIGH (ref 36.0–46.0)
Hemoglobin: 14.9 g/dL (ref 12.0–15.0)
MCH: 26 pg (ref 26.0–34.0)
MCHC: 32.3 g/dL (ref 30.0–36.0)
MCV: 80.3 fL (ref 80.0–100.0)
Platelets: 141 10*3/uL — ABNORMAL LOW (ref 150–400)
RBC: 5.74 MIL/uL — ABNORMAL HIGH (ref 3.87–5.11)
RDW: 18.1 % — ABNORMAL HIGH (ref 11.5–15.5)
WBC: 5.6 10*3/uL (ref 4.0–10.5)
nRBC: 0 % (ref 0.0–0.2)

## 2023-01-07 LAB — GLUCOSE, CAPILLARY
Glucose-Capillary: 73 mg/dL (ref 70–99)
Glucose-Capillary: 77 mg/dL (ref 70–99)
Glucose-Capillary: 105 mg/dL — ABNORMAL HIGH (ref 70–99)
Glucose-Capillary: 204 mg/dL — ABNORMAL HIGH (ref 70–99)
Glucose-Capillary: 181 mg/dL — ABNORMAL HIGH (ref 70–99)

## 2023-01-07 LAB — LACTIC ACID, PLASMA: Lactic Acid, Venous: 1.9 mmol/L (ref 0.5–1.9)

## 2023-01-07 LAB — HEPARIN LEVEL (UNFRACTIONATED): Heparin Unfractionated: 0.26 IU/mL — ABNORMAL LOW (ref 0.30–0.70)

## 2023-01-07 SURGERY — EGD (ESOPHAGOGASTRODUODENOSCOPY)
Anesthesia: General

## 2023-01-07 SURGERY — ESOPHAGOGASTRODUODENOSCOPY (EGD) WITH PROPOFOL
Anesthesia: Monitor Anesthesia Care

## 2023-01-07 MED ORDER — BOOST / RESOURCE BREEZE PO LIQD CUSTOM
1.0000 | Freq: Three times a day (TID) | ORAL | Status: DC
  Administered 2023-01-07 – 2023-01-09 (×3): 1 via ORAL

## 2023-01-07 MED ORDER — HEPARIN (PORCINE) 25000 UT/250ML-% IV SOLN
1150.0000 [IU]/h | INTRAVENOUS | Status: AC
  Administered 2023-01-07 (×2): 1050 [IU]/h via INTRAVENOUS
  Filled 2023-01-07: qty 250

## 2023-01-07 MED ORDER — LIDOCAINE HCL (CARDIAC) PF 100 MG/5ML IV SOSY
PREFILLED_SYRINGE | INTRAVENOUS | Status: DC | PRN
  Administered 2023-01-07: 50 mg via INTRAVENOUS

## 2023-01-07 MED ORDER — PROPOFOL 10 MG/ML IV BOLUS
INTRAVENOUS | Status: DC | PRN
  Administered 2023-01-07: 30 mg via INTRAVENOUS
  Administered 2023-01-07: 20 mg via INTRAVENOUS
  Administered 2023-01-07: 30 mg via INTRAVENOUS
  Administered 2023-01-07: 20 mg via INTRAVENOUS

## 2023-01-07 MED ORDER — CARVEDILOL 3.125 MG PO TABS
3.1250 mg | ORAL_TABLET | Freq: Two times a day (BID) | ORAL | Status: DC
  Administered 2023-01-07 – 2023-01-08 (×3): 3.125 mg via ORAL
  Filled 2023-01-07 (×4): qty 1

## 2023-01-07 MED ORDER — BISACODYL 5 MG PO TBEC
10.0000 mg | DELAYED_RELEASE_TABLET | Freq: Once | ORAL | Status: AC
  Administered 2023-01-07: 10 mg via ORAL
  Filled 2023-01-07: qty 2

## 2023-01-07 MED ORDER — ADULT MULTIVITAMIN W/MINERALS CH
1.0000 | ORAL_TABLET | Freq: Every day | ORAL | Status: DC
  Administered 2023-01-07 – 2023-01-11 (×4): 1 via ORAL
  Filled 2023-01-07 (×4): qty 1

## 2023-01-07 MED ORDER — IOHEXOL 300 MG/ML  SOLN
100.0000 mL | Freq: Once | INTRAMUSCULAR | Status: AC | PRN
  Administered 2023-01-07: 100 mL via INTRAVENOUS

## 2023-01-07 MED ORDER — SODIUM CHLORIDE 0.9 % IV SOLN
INTRAVENOUS | Status: DC
  Administered 2023-01-08: 50 mL/h via INTRAVENOUS

## 2023-01-07 MED ORDER — IOHEXOL 9 MG/ML PO SOLN
500.0000 mL | Freq: Once | ORAL | Status: AC | PRN
  Administered 2023-01-07 (×2): 500 mL via ORAL

## 2023-01-07 MED ORDER — PEG 3350-KCL-NA BICARB-NACL 420 G PO SOLR
4000.0000 mL | Freq: Once | ORAL | Status: AC
  Administered 2023-01-07: 4000 mL via ORAL
  Filled 2023-01-07: qty 4000

## 2023-01-07 MED ORDER — PROSOURCE PLUS PO LIQD
30.0000 mL | Freq: Three times a day (TID) | ORAL | Status: DC
  Administered 2023-01-07 – 2023-01-08 (×3): 30 mL via ORAL

## 2023-01-07 MED ORDER — PEG 3350-KCL-NA BICARB-NACL 420 G PO SOLR
2000.0000 mL | Freq: Once | ORAL | Status: DC | PRN
  Filled 2023-01-07: qty 4000

## 2023-01-07 NOTE — Op Note (Addendum)
[facility] Patient Name: [name] Procedure Date: 01/07/2023 10:00 AM MRN: [id number] Account #: [id number] Date of Birth: 11/21/1966 Admit Type: Inpatient Age: 57 Room: [facility] ENDO ROOM 2 Gender: Female Note Status: Supervisor Override Instrument Name: Altamese Cabal Endoscope [id number] Procedure:             Upper GI endoscopy Indications:           Weight loss Providers:             Annamaria Helling DO, DO Referring MD:          No Local Md, MD (Referring MD) Medicines:             Monitored Anesthesia Care Complications:         No immediate complications. Estimated blood loss:                         Minimal. Procedure:             Pre-Anesthesia Assessment:                        - Prior to the procedure, a History and Physical was                         performed, and patient medications and allergies were                         reviewed. The patient is competent. The risks and                         benefits of the procedure and the sedation options and                         risks were discussed with the patient. All questions                         were answered and informed consent was obtained.                         Patient identification and proposed procedure were                         verified by the physician, the nurse, the anesthetist                         and the technician in the endoscopy suite. Mental                         Status Examination: alert and oriented. Airway                         Examination: normal oropharyngeal airway and neck                         mobility. Respiratory Examination: clear to                         auscultation. CV Examination: irregularly irregular  rate and rhythm. Prophylactic Antibiotics: The patient                         does not require prophylactic antibiotics. Prior                         Anticoagulants: The patient has taken heparin, last                          dose was day of procedure. ASA Grade Assessment: III -                         A patient with severe systemic disease. After                         reviewing the risks and benefits, the patient was                         deemed in satisfactory condition to undergo the                         procedure. The anesthesia plan was to use monitored                         anesthesia care (MAC). Immediately prior to                         administration of medications, the patient was                         re-assessed for adequacy to receive sedatives. The                         heart rate, respiratory rate, oxygen saturations,                         blood pressure, adequacy of pulmonary ventilation, and                         response to care were monitored throughout the                         procedure. The physical status of the patient was                         re-assessed after the procedure.                        After obtaining informed consent, the endoscope was                         passed under direct vision. Throughout the procedure,                         the patient's blood pressure, pulse, and oxygen                         saturations were monitored continuously. The Endoscope  was introduced through the mouth, and advanced to the                         third part of duodenum. The upper GI endoscopy was                         accomplished without difficulty. The patient tolerated                         the procedure well. Findings:      The duodenal bulb, first portion of the duodenum and second portion of       the duodenum were normal.      Localized mild inflammation characterized by erythema was found on the       lesser curvature of the stomach and at the incisura. Biopsies were taken       with a cold forceps for histology. Biopsies were taken with a cold       forceps for Helicobacter pylori testing. Estimated blood loss  was       minimal.      The exam of the stomach was otherwise normal.      LA Grade A (one or more mucosal breaks less than 5 mm, not extending       between tops of 2 mucosal folds) esophagitis with no bleeding was found.       Estimated blood loss: none.      The Z-line was regular.      Esophagogastric landmarks were identified: the gastroesophageal junction       was found at 36 cm from the incisors.      The exam of the esophagus was otherwise normal.      No endoscopic abnormality was evident in the esophagus to explain the       patient's complaint of dysphagia. Estimated blood loss: none. Impression:            - Normal duodenal bulb, first portion of the duodenum                         and second portion of the duodenum.                        - Gastritis. Biopsied.                        - LA Grade A esophagitis with no bleeding.                        - Z-line regular.                        - Esophagogastric landmarks identified.                        - No endoscopic esophageal abnormality to explain                         patient's dysphagia. Recommendation:        - Patient has a contact number available for                         emergencies. The signs and  symptoms of potential                         delayed complications were discussed with the patient.                         Return to normal activities tomorrow. Written                         discharge instructions were provided to the patient.                        - Discharge patient to home.                        - Clear liquid diet.                        - Continue present medications.                        - No ibuprofen, naproxen, or other non-steroidal                         anti-inflammatory drugs.                        - Use Protonix (pantoprazole) 40 mg PO BID.                        - Await pathology results.                        - Recommend colonoscopy given weight loss. Planned for                          tomorrow                        Start go lytely prep today.                        - The findings and recommendations were discussed with                         the patient.                        - The findings and recommendations were discussed with                         the referring physician. Procedure Code(s):     --- Professional ---                        [phone], Esophagogastroduodenoscopy, flexible,                         transoral; with biopsy, single or multiple Diagnosis Code(s):     --- Professional ---                        K29.70, Gastritis, unspecified, without bleeding  K20.90, Esophagitis, unspecified without bleeding                        R13.10, Dysphagia, unspecified                        R63.4, Abnormal weight loss CPT copyright 2022 American Medical Association. All rights reserved. The codes documented in this report are preliminary and upon coder review may  be revised to meet current compliance requirements. Attending Participation:      I personally performed the entire procedure. Volney American, DO Annamaria Helling DO, DO 01/07/2023 10:35:29 AM This report has been signed electronically. Number of Addenda: 0 Note Initiated On: 01/07/2023 10:00 AM Estimated Blood Loss:  Estimated blood loss was minimal.      [facility]

## 2023-01-07 NOTE — Transfer of Care (Signed)
Immediate Anesthesia Transfer of Care Note  Patient: Luis Abed  Procedure(s) Performed: ESOPHAGOGASTRODUODENOSCOPY (EGD)  Patient Location: PACU and Endoscopy Unit  Anesthesia Type:General  Level of Consciousness: awake and drowsy  Airway & Oxygen Therapy: Patient Spontanous Breathing  Post-op Assessment: Report given to RN and Post -op Vital signs reviewed and stable  Post vital signs: Reviewed and stable  Last Vitals:  Vitals Value Taken Time  BP 138/88   Temp    Pulse 104 01/07/23 1034  Resp 20 01/07/23 1034  SpO2 98 % 01/07/23 1034  Vitals shown include unvalidated device data.  Last Pain:  Vitals:   01/07/23 0344  TempSrc: Axillary  PainSc:          Complications: No notable events documented.

## 2023-01-07 NOTE — TOC Initial Note (Signed)
Transition of Care [facility]) - Initial/Assessment Note    Patient Details  Name: [name] MRN: [id number] Date of Birth: 1966/10/02  Transition of Care [facility]) CM/SW Contact:    Candie Chroman, LCSW Phone Number: 01/07/2023, 1:15 PM  Clinical Narrative:  CSW met with patient. No supports at bedside. SDOH flag for transportation. Patient said her son has been transporting her to appointments. She is already set up with Medicaid Transportation but has not had to use them. Encouraged her to call them to make sure she is still actively on their list for appointments where son is unable to transport. No further concerns. CSW encouraged patient to contact CSW as needed. CSW will continue to follow patient for support and facilitate return home once stable. Son will likely transport her home at discharge.  Expected Discharge Plan: Home/Self Care Barriers to Discharge: Continued Medical Work up   Patient Goals and CMS Choice            Expected Discharge Plan and Services     Post Acute Care Choice: NA Living arrangements for the past 2 months: Single Family Home                                      Prior Living Arrangements/Services Living arrangements for the past 2 months: Single Family Home   Patient language and need for interpreter reviewed:: Yes Do you feel safe going back to the place where you live?: Yes      Need for Family Participation in Patient Care: Yes (Comment)     Criminal Activity/Legal Involvement Pertinent to Current Situation/Hospitalization: No - Comment as needed  Activities of Daily Living Home Assistive Devices/Equipment: None ("i need a wheelchair") ADL Screening (condition at time of admission) Patient's cognitive ability adequate to safely complete daily activities?: Yes Is the patient deaf or have difficulty hearing?: No Does the patient have difficulty seeing, even when wearing glasses/contacts?: No Does the patient have  difficulty concentrating, remembering, or making decisions?: No Patient able to express need for assistance with ADLs?: Yes Does the patient have difficulty dressing or bathing?: No Independently performs ADLs?: No Communication: Independent Dressing (OT): Independent Grooming: Independent Feeding: Independent Bathing: Independent Toileting: Needs assistance Is this a change from baseline?: Change from baseline, expected to last <3 days In/Out Bed: Needs assistance Is this a change from baseline?: Pre-admission baseline Walks in Home: Needs assistance Is this a change from baseline?: Pre-admission baseline Does the patient have difficulty walking or climbing stairs?: Yes Weakness of Legs: Both Weakness of Arms/Hands: Both  Permission Sought/Granted                  Emotional Assessment Appearance:: Appears stated age Attitude/Demeanor/Rapport: Engaged, Gracious Affect (typically observed): Accepting, Appropriate, Calm, Pleasant Orientation: : Oriented to Self, Oriented to Place, Oriented to  Time, Oriented to Situation Alcohol / Substance Use: Not Applicable Psych Involvement: No (comment)  Admission diagnosis:  Weight loss [R63.4] New onset atrial fibrillation (HCC) [I48.91] Intractable vomiting [R11.10] Dysphagia, unspecified type [R13.10] Patient Active Problem List   Diagnosis Date Noted   Hypercalcemia 01/06/2023   High anion gap metabolic acidosis 07/37/1062   Dysphagia 01/06/2023   Unintentional weight loss 01/06/2023   AKI (acute kidney injury) ([place]) 01/06/2023   Intractable vomiting 01/06/2023   Urinary tract infection 01/06/2023   Diabetes mellitus without complication ([place]) 69/48/5462   Lactic acidosis 01/06/2023   Elevated  troponin 77/82/4235   Acute diastolic CHF (congestive heart failure) (HCC)    Obesity, Class III, BMI 40-49.9 (morbid obesity) ([place])    Hypertensive urgency 11/13/2019   PCP:  Kerri Perches, PA-C Pharmacy:   [facility] [address] [place]) [place] [address] Phone: [phone] Fax: [place] [address] Phone: [phone] Fax: [place] [address] Phone: [phone] Fax: [phone]     Social Determinants of Health (SDOH) Social History: SDOH Screenings   Food Insecurity: No Food Insecurity (01/06/2023)  Housing: Low Risk  (01/06/2023)  Transportation Needs: Unmet Transportation Needs (01/06/2023)  Utilities: Not At Risk (01/06/2023)  Tobacco Use: Low Risk  (01/07/2023)   SDOH Interventions:     Readmission Risk Interventions     No data to display

## 2023-01-07 NOTE — Care Plan (Signed)
Brief GI Post op note  Se op note for further details EGD with gastritis/esophagitis today; bx taken. No obstruction Consider ct scan of chest/abdomen/pelvis given weight loss Plan for colonoscopy tomorrow.  Heparin will need to be held 6 hours prior to the procedure minimum Clear liquids now. NPO at midnight Ducolax now Go lytely prep to start- ok to drink after npo midnight order Contineu protonix 40 mg po bid for now  Colonoscopy with possible biopsy, control of bleeding, polypectomy, and interventions as necessary has been discussed with the patient/patient representative. Informed consent was obtained from the patient/patient representative after explaining the indication, nature, and risks of the procedure including but not limited to death, bleeding, perforation, missed neoplasm/lesions, cardiorespiratory compromise, and reaction to medications. Opportunity for questions was given and appropriate answers were provided. Patient/patient representative has verbalized understanding is amenable to undergoing the procedure.   Ronne Binning, [facility]

## 2023-01-07 NOTE — Progress Notes (Signed)
Initial Nutrition Assessment  DOCUMENTATION CODES:   Obesity unspecified  INTERVENTION:   -Boost Breeze po TID, each supplement provides 250 kcal and 9 grams of protein  -MVI with minerals daily -30 ml Prosource Plus TID, each supplement provides 100 kcals and 15 grams protein  NUTRITION DIAGNOSIS:   Inadequate oral intake related to altered GI function as evidenced by NPO status.  GOAL:   Patient will meet greater than or equal to 90% of their needs  MONITOR:   PO intake, Supplement acceptance, Diet advancement  REASON FOR ASSESSMENT:   Malnutrition Screening Tool    ASSESSMENT:   Pt with medical history significant for Diabetes and high blood pressure, as well as hospitalization in 2020 for hypertensive emergency, who presents with a several week history of postprandial vomiting, difficulty swallowing down solid food, and has been limited to liquid diet with soups, juices and water,.  She has been unable to hold on her medication.  Pt admitted with intractable vomiting and UTI.   2/6- s/p EGD- revealed gastritis, biopsies taken; advanced to clear liquid diet  Per MD notes, plan for colonoscopy tomorrow.   Spoke with pt at bedside, who was drowsy from just returning from EGD. Pt reports decreased oral intake over the past 45 days, stating difficulty keeping down foods and liquids during that time, eating "pretty much nothing". Noted pt consuming water and gingerale at time of visit without difficulty.   Per pt, her UBW is around 245#. She estimates that she has lost 20-25# since symptoms have started. Noted pt also with hair loss and thinning, which she also noticed around the time that symptoms started. Reviewed wt hx; no recent wt history available, but noted distant history of weight loss.   Case discussed with RN and reviewed plan of care.   Discussed rationale for clear liquid diet. Pt is amenable to supplements.   Medications reviewed and include 0.9% sodium  chloride infusion @ 50 ml/hr.   Lab Results  Component Value Date   HGBA1C 8.2 (H) 01/06/2023   PTA DM medications are 500 mg metformin BID.   Labs reviewed: CBGS: 105 (inpatient orders for glycemic control are 0-15 units insulin aspart every 4 hours).    NUTRITION - FOCUSED PHYSICAL EXAM:  Flowsheet Row Most Recent Value  Orbital Region No depletion  Upper Arm Region Mild depletion  Thoracic and Lumbar Region No depletion  Buccal Region No depletion  Temple Region No depletion  Clavicle Bone Region No depletion  Clavicle and Acromion Bone Region No depletion  Scapular Bone Region No depletion  Dorsal Hand No depletion  Patellar Region Mild depletion  Anterior Thigh Region Mild depletion  Posterior Calf Region Mild depletion  Edema (RD Assessment) Mild  Hair Reviewed  Eyes Reviewed  Mouth Reviewed  Skin Reviewed  Nails Reviewed       Diet Order:   Diet Order             Diet NPO time specified  Diet effective midnight           Diet clear liquid Fluid consistency: Thin  Diet effective now                   EDUCATION NEEDS:   Education needs have been addressed  Skin:  Skin Assessment: Reviewed RN Assessment  Last BM:  Unknown  Height:   Ht Readings from Last 1 Encounters:  01/06/23 5\' 6"  (1.676 m)    Weight:   Wt Readings from Last  1 Encounters:  01/06/23 94.5 kg    Ideal Body Weight:  59.1 kg  BMI:  Body mass index is 33.62 kg/m.  Estimated Nutritional Needs:   Kcal:  1800-2000  Protein:  90-105 grams  Fluid:  > 1.8 L    Loistine Chance, RD, LDN, [place] Registered Dietitian II Certified Diabetes Care and Education Specialist Please refer to [facility] for RD and/or RD on-call/weekend/after hours pager

## 2023-01-07 NOTE — Consult Note (Signed)
ANTICOAGULATION CONSULT NOTE   Pharmacy Consult for Heparin Indication:  New Onset Atrial Fibrillation  No Known Allergies  Patient Measurements: Height: 5\' 6"  (167.6 cm) Weight: 94.5 kg (208 lb 4.8 oz) IBW/kg (Calculated) : 59.3 Heparin Dosing Weight: 80.2 kg  Vital Signs: Temp: 96.2 F (35.7 C) (02/06 1033) Temp Source: Temporal (02/06 1033) BP: 158/116 (02/06 1057) Pulse Rate: 95 (02/06 1047)  Labs: Recent Labs    01/06/23 0034 01/06/23 0035 01/06/23 0333 01/06/23 1606 01/06/23 2317 01/07/23 0428 01/07/23 0556  HGB  --  16.7*  --   --   --  14.9  --   HCT  --  52.9*  --   --   --  46.1*  --   PLT  --  196  --   --   --  141*  --   APTT  --   --   --  30  --   --   --   LABPROT  --   --   --  15.1  --   --   --   INR  --   --   --  1.2  --   --   --   HEPARINUNFRC  --   --   --  <0.10* 1.04*  --   --   CREATININE  --  1.21*  --  1.11*  --   --  0.97  CKTOTAL  --  22*  --   --   --   --   --   TROPONINIHS 93*  --  84*  --   --   --   --      Estimated Creatinine Clearance: 74.1 mL/min (by C-G formula based on SCr of 0.97 mg/dL).   Medical History: Past Medical History:  Diagnosis Date   CHF (congestive heart failure) ([place])    Diabetes mellitus without complication (HCC)    Hypertension     Medications:  No history of chronic anticoagulant use PTA  Assessment: 57 year old female who has a past medical history of hypertension, diabetes, and congestive heart failure presenting to the ED with nausea and vomiting. Upon further assessment and EKG results, was found to have new onset atrial fibrillation. Pharmacy has been consulted to initiation and titrate heparin infusion. Baseline labs have been ordered and are pending.  Goal of Therapy:  Heparin level 0.3-0.7 units/ml Monitor platelets by anticoagulation protocol: Yes   2/5 2317 HL 1.04, supratherapeutic 2/6 1200 - resume heparin gtt now and stop at mid-night per MD orders (colonoscopy tomorrow  2/7)  Plan:  Resume heparin infusion to 1050 units/hr (no bolus) Recheck HL 6hrs after infusion started Stop heparin infusion at mid-night per MD instructions (colonoscopy in AM) Continue to monitor H&H and platelets  Shellene Sweigert Rodriguez-Guzman PharmD, BCPS 01/07/2023 12:02 PM

## 2023-01-07 NOTE — Anesthesia Procedure Notes (Signed)
Procedure Name: MAC Date/Time: 01/07/2023 10:20 AM  Performed by: Jerrye Noble, CRNAPre-anesthesia Checklist: Patient identified, Emergency Drugs available, Suction available and Patient being monitored Patient Re-evaluated:Patient Re-evaluated prior to induction Oxygen Delivery Method: Nasal cannula

## 2023-01-07 NOTE — Interval H&P Note (Signed)
History and Physical Interval Note: Consult Note from 01/07/23  was reviewed and there was no interval change after seeing and examining the patient.  Heparin gtt has been discontinued since 0400. Cardiology has seen the patient and rated at moderate risk, but ok to pursue procedure from their standpoint. NPO since midnight. Written consent was obtained from the patient after discussion of risks, benefits, and alternatives. Patient has consented to proceed with Esophagogastroduodenoscopy with possible intervention  Esophagogastroduodenoscopy with possible biopsy, control of bleeding, polypectomy, and interventions as necessary has been discussed with the patient/patient representative. Informed consent was obtained from the patient/patient representative after explaining the indication, nature, and risks of the procedure including but not limited to death, bleeding, perforation, missed neoplasm/lesions, cardiorespiratory compromise, and reaction to medications. Opportunity for questions was given and appropriate answers were provided. Patient/patient representative has verbalized understanding is amenable to undergoing the procedure.   01/07/2023 10:12 AM  [name]  has presented today for surgery, with the diagnosis of NAUSEA VOMITING WEIGHT LOSS.  The various methods of treatment have been discussed with the patient and family. After consideration of risks, benefits and other options for treatment, the patient has consented to  Procedure(s): ESOPHAGOGASTRODUODENOSCOPY (EGD) (N/A) as a surgical intervention.  The patient's history has been reviewed, patient examined, no change in status, stable for surgery.  I have reviewed the patient's chart and labs.  Questions were answered to the patient's satisfaction.     Annamaria Helling

## 2023-01-07 NOTE — Anesthesia Preprocedure Evaluation (Signed)
Anesthesia Evaluation  Patient identified by MRN, date of birth, ID band Patient awake    Reviewed: Allergy & Precautions, H&P , NPO status , Patient's Chart, lab work & pertinent test results, reviewed documented beta blocker date and time   History of Anesthesia Complications Negative for: history of anesthetic complications  Airway Mallampati: III  TM Distance: >3 FB Neck ROM: full    Dental  (+) Dental Advidsory Given, Poor Dentition, Missing, Chipped   Pulmonary neg pulmonary ROS   Pulmonary exam normal breath sounds clear to auscultation       Cardiovascular Exercise Tolerance: Good hypertension, (-) angina +CHF  (-) Past MI and (-) Cardiac Stents Normal cardiovascular exam(-) dysrhythmias (-) Valvular Problems/Murmurs Rhythm:regular Rate:Normal     Neuro/Psych negative neurological ROS  negative psych ROS   GI/Hepatic Neg liver ROS,GERD  ,,  Endo/Other  negative endocrine ROSdiabetes, Well Controlled, Type 2, Oral Hypoglycemic Agents    Renal/GU negative Renal ROS  negative genitourinary   Musculoskeletal   Abdominal   Peds  Hematology negative hematology ROS (+)   Anesthesia Other Findings Past Medical History: No date: CHF (congestive heart failure) (HCC) No date: Diabetes mellitus without complication (HCC) No date: Hypertension   Reproductive/Obstetrics negative OB ROS                             Anesthesia Physical Anesthesia Plan  ASA: 3  Anesthesia Plan: General   Post-op Pain Management:    Induction: Intravenous  PONV Risk Score and Plan: 3 and Propofol infusion and TIVA  Airway Management Planned: Natural Airway and Nasal Cannula  Additional Equipment:   Intra-op Plan:   Post-operative Plan:   Informed Consent: I have reviewed the patients History and Physical, chart, labs and discussed the procedure including the risks, benefits and alternatives for the  proposed anesthesia with the patient or authorized representative who has indicated his/her understanding and acceptance.     Dental Advisory Given  Plan Discussed with: Anesthesiologist, CRNA and Surgeon  Anesthesia Plan Comments:        Anesthesia Quick Evaluation

## 2023-01-07 NOTE — Progress Notes (Signed)
Rounding Note    Patient Name: [name] Date of Encounter: 01/07/2023  [place] Cardiologist: None  New consult done by Dr. Fletcher Anon  Subjective   Patient seen on rounds. Denies any chest pain or shortness of breath. Underwent upper endoscopy this morning.   Inpatient Medications    Scheduled Meds:  (feeding supplement) PROSource Plus  30 mL Oral TID BM   carvedilol  3.125 mg Oral BID WC   feeding supplement  1 Container Oral TID BM   insulin aspart  0-15 Units Subcutaneous Q4H   multivitamin with minerals  1 tablet Oral Daily   pantoprazole (PROTONIX) IV  40 mg Intravenous Q12H   polyethylene glycol-electrolytes  4,000 mL Oral Once   Continuous Infusions:  cefTRIAXone (ROCEPHIN)  IV Stopped (01/07/23 0235)   heparin     PRN Meds: acetaminophen **OR** acetaminophen, hydrALAZINE, morphine injection, ondansetron **OR** ondansetron (ZOFRAN) IV, polyethylene glycol-electrolytes   Vital Signs    Vitals:   01/07/23 1033 01/07/23 1047 01/07/23 1057 01/07/23 1219  BP: 138/88 (!) 159/91 (!) 158/116 (!) 185/104  Pulse: (!) 101 95  84  Resp: (!) 23 (!) 23 17 18   Temp: (!) 96.2 F (35.7 C)   98.4 F (36.9 C)  TempSrc: Temporal   Oral  SpO2: 98% 100%  99%  Weight:      Height:        Intake/Output Summary (Last 24 hours) at 01/07/2023 1220 Last data filed at 01/07/2023 0400 Gross per 24 hour  Intake 536.33 ml  Output 399 ml  Net 137.33 ml      01/06/2023   12:36 AM 02/01/2020   12:33 PM 12/28/2019   12:37 PM  Last 3 Weights  Weight (lbs) 208 lb 4.8 oz 262 lb 9.6 oz 264 lb  Weight (kg) 94.484 kg 119.115 kg 119.75 kg      Telemetry    Rate controlled atrial fibrillation rates 80-100 - Personally Reviewed  ECG    No new tracings - Personally Reviewed  Physical Exam   GEN: No acute distress.   Neck: No JVD Cardiac: irregularly irregular, no murmurs, rubs, or gallops.  Respiratory: Clear to auscultation bilaterally. Congested nonproductive  cough. Respirations are unlabored at rest on room air GI: Soft, nontender, non-distended  MS: No edema; No deformity. Neuro:  Nonfocal  Psych: Normal affect   Labs    High Sensitivity Troponin:   Recent Labs  Lab 01/06/23 0034 01/06/23 0333  TROPONINIHS 93* 84*     Chemistry Recent Labs  Lab 01/06/23 0034 01/06/23 0035 01/06/23 1606 01/07/23 0556  NA  --  137 132* 135  K  --  4.4 5.0 4.3  CL  --  95* 96* 98  CO2  --  24 24 25   GLUCOSE  --  184* 163* 82  BUN  --  23* 22* 19  CREATININE  --  1.21* 1.11* 0.97  CALCIUM  --  11.2* 9.7 9.7  MG 2.4  --   --   --   PROT  --  8.3*  --   --   ALBUMIN  --  3.4*  --   --   AST  --  26  --   --   ALT  --  16  --   --   ALKPHOS  --  53  --   --   BILITOT  --  1.5*  --   --   GFRNONAA  --  52* 58* >60  ANIONGAP  --  18* 12 12    Lipids No results for input(s): "CHOL", "TRIG", "HDL", "LABVLDL", "LDLCALC", "CHOLHDL" in the last 168 hours.  Hematology Recent Labs  Lab 01/06/23 0035 01/07/23 0428  WBC 4.2 5.6  RBC 6.64* 5.74*  HGB 16.7* 14.9  HCT 52.9* 46.1*  MCV 79.7* 80.3  MCH 25.2* 26.0  MCHC 31.6 32.3  RDW 18.4* 18.1*  PLT 196 141*   Thyroid  Recent Labs  Lab 01/06/23 0034  TSH 0.689    BNP Recent Labs  Lab 01/06/23 0034  BNP 553.3*    DDimer No results for input(s): "DDIMER" in the last 168 hours.   Radiology    ECHOCARDIOGRAM COMPLETE  Result Date: 01/06/2023    ECHOCARDIOGRAM REPORT   Patient Name:   [name] [facility] Date of Exam: 01/06/2023 Medical Rec #:  [id number]             Height:       66.0 in Accession #:    [id number]            Weight:       208.3 lb Date of Birth:  Apr 06, 1966            BSA:          2.035 m Patient Age:    56 years              BP:           152/100 mmHg Patient Gender: F                     HR:           83 bpm. Exam Location:  [facility] Procedure: 2D Echo, Cardiac Doppler and Color Doppler Indications:     CHF  History:         Patient has prior history of Echocardiogram  examinations, most                  recent 11/13/2019. CHF; Risk Factors:Hypertension and Diabetes.  Sonographer:     Mikki Harbor Referring Phys:  [id number] Andris Baumann Diagnosing Phys: Lorine Bears MD  Sonographer Comments: Patient is obese. IMPRESSIONS  1. Left ventricular ejection fraction, by estimation, is 35 to 40%. The left ventricle has moderately decreased function. The left ventricle demonstrates global hypokinesis. The left ventricular internal cavity size was moderately dilated. There is mild  left ventricular hypertrophy. Left ventricular diastolic parameters are indeterminate.  2. Right ventricular systolic function is normal. The right ventricular size is normal. Tricuspid regurgitation signal is inadequate for assessing PA pressure.  3. Left atrial size was moderately dilated.  4. Right atrial size was mildly dilated.  5. The mitral valve is normal in structure. Mild to moderate mitral valve regurgitation. No evidence of mitral stenosis.  6. The aortic valve is normal in structure. Aortic valve regurgitation is mild to moderate. Aortic valve sclerosis/calcification is present, without any evidence of aortic stenosis.  7. Pulmonic valve regurgitation is moderate.  8. Aortic dilatation noted. There is mild dilatation of the aortic root, measuring 39 mm.  9. The inferior vena cava is normal in size with greater than 50% respiratory variability, suggesting right atrial pressure of 3 mmHg. FINDINGS  Left Ventricle: Left ventricular ejection fraction, by estimation, is 35 to 40%. The left ventricle has moderately decreased function. The left ventricle demonstrates global hypokinesis. The left ventricular internal cavity size was moderately dilated. There is mild left ventricular hypertrophy.  Left ventricular diastolic parameters are indeterminate. Right Ventricle: The right ventricular size is normal. No increase in right ventricular wall thickness. Right ventricular systolic function is normal.  Tricuspid regurgitation signal is inadequate for assessing PA pressure. Left Atrium: Left atrial size was moderately dilated. Right Atrium: Right atrial size was mildly dilated. Pericardium: There is no evidence of pericardial effusion. Mitral Valve: The mitral valve is normal in structure. Mild mitral annular calcification. Mild to moderate mitral valve regurgitation. No evidence of mitral valve stenosis. MV peak gradient, 2.7 mmHg. The mean mitral valve gradient is 1.0 mmHg. Tricuspid Valve: The tricuspid valve is normal in structure. Tricuspid valve regurgitation is trivial. No evidence of tricuspid stenosis. Aortic Valve: The aortic valve is normal in structure. Aortic valve regurgitation is mild to moderate. Aortic regurgitation PHT measures 918 msec. Aortic valve sclerosis/calcification is present, without any evidence of aortic stenosis. Aortic valve mean  gradient measures 4.0 mmHg. Aortic valve peak gradient measures 9.6 mmHg. Aortic valve area, by VTI measures 2.71 cm. Pulmonic Valve: The pulmonic valve was normal in structure. Pulmonic valve regurgitation is moderate. No evidence of pulmonic stenosis. Aorta: Aortic dilatation noted. There is mild dilatation of the aortic root, measuring 39 mm. Venous: The inferior vena cava is normal in size with greater than 50% respiratory variability, suggesting right atrial pressure of 3 mmHg. IAS/Shunts: There is right bowing of the interatrial septum, suggestive of elevated left atrial pressure. No atrial level shunt detected by color flow Doppler.  LEFT VENTRICLE PLAX 2D LVIDd:         6.40 cm   Diastology LVIDs:         5.40 cm   LV e' medial:    6.09 cm/s LV PW:         1.60 cm   LV E/e' medial:  9.8 LV IVS:        1.30 cm   LV e' lateral:   5.44 cm/s LVOT diam:     2.20 cm   LV E/e' lateral: 11.0 LV SV:         64 LV SV Index:   31 LVOT Area:     3.80 cm  RIGHT VENTRICLE RV Basal diam:  4.10 cm RV Mid diam:    3.90 cm RV S prime:     7.51 cm/s TAPSE (M-mode):  2.2 cm LEFT ATRIUM              Index        RIGHT ATRIUM           Index LA diam:        3.90 cm  1.92 cm/m   RA Area:     23.20 cm LA Vol (A2C):   125.0 ml 61.44 ml/m  RA Volume:   72.60 ml  35.68 ml/m LA Vol (A4C):   67.2 ml  33.03 ml/m LA Biplane Vol: 102.0 ml 50.13 ml/m  AORTIC VALVE                    PULMONIC VALVE AV Area (Vmax):    3.21 cm     PV Vmax:       1.00 m/s AV Area (Vmean):   3.19 cm     PV Peak grad:  4.0 mmHg AV Area (VTI):     2.71 cm AV Vmax:           155.00 cm/s AV Vmean:          86.600 cm/s AV VTI:  0.236 m AV Peak Grad:      9.6 mmHg AV Mean Grad:      4.0 mmHg LVOT Vmax:         131.00 cm/s LVOT Vmean:        72.600 cm/s LVOT VTI:          0.168 m LVOT/AV VTI ratio: 0.71 AI PHT:            918 msec  AORTA Ao Root diam: 3.90 cm Ao Asc diam:  3.10 cm MITRAL VALVE MV Area (PHT): 4.74 cm    SHUNTS MV Area VTI:   3.16 cm    Systemic VTI:  0.17 m MV Peak grad:  2.7 mmHg    Systemic Diam: 2.20 cm MV Mean grad:  1.0 mmHg MV Vmax:       0.83 m/s MV Vmean:      51.3 cm/s MV Decel Time: 160 msec MV E velocity: 59.90 cm/s Kathlyn Sacramento MD Electronically signed by Kathlyn Sacramento MD Signature Date/Time: 01/06/2023/10:52:26 AM    Final    DG Abdomen Acute W/Chest  Result Date: 01/06/2023 CLINICAL DATA:  Vomiting EXAM: DG ABDOMEN ACUTE WITH 1 VIEW CHEST COMPARISON:  Chest x-ray 11/13/2019 FINDINGS: There is no evidence of dilated bowel loops or free intraperitoneal air. No radiopaque calculi or other significant radiographic abnormality is seen. Heart size and mediastinal contours are within normal limits. Both lungs are clear. IMPRESSION: Negative abdominal radiographs.  No acute cardiopulmonary disease. Electronically Signed   By: Rolm Baptise M.D.   On: 01/06/2023 02:05    Cardiac Studies  TTE 01/06/23 1. Left ventricular ejection fraction, by estimation, is 35 to 40%. The  left ventricle has moderately decreased function. The left ventricle  demonstrates global  hypokinesis. The left ventricular internal cavity size  was moderately dilated. There is mild   left ventricular hypertrophy. Left ventricular diastolic parameters are  indeterminate.   2. Right ventricular systolic function is normal. The right ventricular  size is normal. Tricuspid regurgitation signal is inadequate for assessing  PA pressure.   3. Left atrial size was moderately dilated.   4. Right atrial size was mildly dilated.   5. The mitral valve is normal in structure. Mild to moderate mitral valve  regurgitation. No evidence of mitral stenosis.   6. The aortic valve is normal in structure. Aortic valve regurgitation is  mild to moderate. Aortic valve sclerosis/calcification is present, without  any evidence of aortic stenosis.   7. Pulmonic valve regurgitation is moderate.   8. Aortic dilatation noted. There is mild dilatation of the aortic root,  measuring 39 mm.   9. The inferior vena cava is normal in size with greater than 50%  respiratory variability, suggesting right atrial pressure of 3 mmHg.    Patient Profile     57 y.o. female with a history of hypertension, diabetes, congestive heart failure, who is being seen and evaluated for HFrEF and new onset atrial fibrillation.   Assessment & Plan    Intractable vomiting/dysphagia/lactic acidosis/anion gap metabolic acidosis/starvation ketosis -Lactic acid continues to be elevated at 2.2, slightly improving -Likely related to dehydration and poor oral intake -GI completed upper endoscopy today without findings of dysphagia patient is scheduled for colonoscopy tomorrow -Management per IM  Acute HFrEF -LVEF 35-40% -BNP 553.3 -Last echocardiogram revealed LVEF 55 to 60% -Continue with heart failure education -Started on carvedilol 3.125 mg twice daily -Escalate GDMT once patient is tolerating all medications as tolerated -Daily weights, I's and  O's, low-sodium diet -Will need further ischemic evaluation with right and  left heart catheterization likely as an outpatient when she recovers from acute illness.  Recent drop in heart function  : New onset atrial fibrillation -Remains rate controlled 70-80 -Continued on heparin infusion -Transition to DOAC prior to discharge for CHA2DS2-VASc score of at least 4 -Heparin drip to be placed on hold at midnight for colonoscopy procedure tomorrow with GI -Continue telemetry monitoring -Since she continues to be rate controlled elective cardioversion can be completed outpatient after she has been on 4 weeks of uninterrupted anticoagulation  Elevated high-sensitivity troponin likely demand ischemia secondary to hypertensive urgency, lactic acidosis, elevated BNP -High-sensitivity troponins trended 93 and 84 -No ischemic changes noted on EKG -Continues to remain chest pain-free -Telemetry monitoring -EKG as needed for pain or changes  Hypertensive urgency -Blood pressure 185/104 -Restart carvedilol 3.125 mg twice daily -Continue with as needed hydralazine 5 mg IV P -Vital signs per unit protocol  AKI-resolved -Serum creatinine this morning 0.97 -Daily BMP -Monitor urine output -Monitor/trend/replete electrolytes as needed -Avoid nephrotoxic agents were able  Type 2 diabetes -Continue sliding scale coverage -Management per IM     CHA2DS2-VASc Score = 4   This indicates a 4.8% annual risk of stroke. The patient's score is based upon: CHF History: 1 HTN History: 1 Diabetes History: 1 Stroke History: 0 Vascular Disease History: 0 Age Score: 0 Gender Score: 1     For questions or updates, please contact [place] Please consult [email or web address] for contact info under        Signed, Rosaura Bolon, NP  01/07/2023, 12:20 PM

## 2023-01-07 NOTE — Progress Notes (Signed)
PROGRESS NOTE  [name] ZSW:[id number] DOB: 10/08/1966 DOA: 01/06/2023 PCP: Kerri Perches, [facility] Course/Subjective: [name] is a 57 y.o. female with medical history significant for Diabetes and high blood pressure, as well as hospitalization in 2020 for hypertensive emergency, who presents to the ED with a several week history of postprandial vomiting, difficulty swallowing down solid food, and has been limited to liquid diet with soups, juices and water,.  She has been unable to hold on her medication.    Seen this a.m. back in her room after EGD, gastritis seen and biopsies taken.  She has been placed on a clear liquid diet by GI.  They anticipate making her n.p.o. after midnight tonight and doing a bowel prep, for colonoscopy in the morning.  Assessment/Plan: * Intractable vomiting Dysphagia Lactic acidosis/anion gap metabolic acidosis/starvation ketosis Lactic acid 3.8 with anion gap of 18 and beta hydroxybutyric acid of 1.53.   Recheck Lactate this AM pending at this time. Likely related to dehydration from poor oral intake Patient states her vomiting is better this morning, clear liquid diet and n.p.o. after midnight for colonoscopy. IV hydration, IV antiemetics as needed  Urinary tract infection IV Rocephin daily Follow cultures  AKI (acute kidney injury) ([place]), resolved with hydration Secondary to poor oral intake IV hydration and monitor, recheck Cr now  Unintentional weight loss Secondary to poor oral intake and with some concern for underlying occult process BMI change from above 40>33 in 2 years Patient not up-to-date with age-related cancer screenings. Will scan chest abdomen pelvis today.  Hypercalcemia Calcium elevated at 11.2 Hydrate and monitor Further diagnostic evaluation if not corrected  Hypertensive urgency BP 189/122 on arrival Likely related to inability to take oral meds, versus noncompliance.  History of  hospitalization in 2020 with hypertensive urgency and acute diastolic CHF Hydralazine IV every 6 until able to tolerate her PO meds  Elevated troponin Troponin elevated at 93 with BNP 553, trending down Patient clinically dry and chest x-ray nonacute Likely secondary to demand ischemia from elevated blood pressure, dehydration Continue to trend Echocardiogram to evaluate for focal wall motion abnormality in view of risk factors  Diabetes mellitus without complication (HCC) Sliding scale insulin coverage  DVT Prophylaxis: Lovenox  Code Status: FULL   Family Communication: None present   Disposition Plan: Inpatient, likely home at discharge in 1 to 2 days..  Consultants: GI   Procedures: None   Antimicrobials: Anti-infectives (From admission, onward)    Start     Dose/Rate Route Frequency Ordered Stop   01/07/23 0200  cefTRIAXone (ROCEPHIN) 2 g in sodium chloride 0.9 % 100 mL IVPB        2 g 200 mL/hr over 30 Minutes Intravenous Every 24 hours 01/06/23 0738 01/11/23 0159   01/06/23 0130  cefTRIAXone (ROCEPHIN) 1 g in sodium chloride 0.9 % 100 mL IVPB        1 g 200 mL/hr over 30 Minutes Intravenous STAT 01/06/23 0126 01/06/23 0300       Objective: Vitals:   01/07/23 0933 01/07/23 1033 01/07/23 1047 01/07/23 1057  BP:  138/88 (!) 159/91 (!) 158/116  Pulse:  (!) 101 95   Resp:  (!) 23 (!) 23 17  Temp: (!) 96.2 F (35.7 C) (!) 96.2 F (35.7 C)    TempSrc:  Temporal    SpO2:  98% 100%   Weight:      Height:        Intake/Output Summary (Last 24 hours) at  01/07/2023 1215 Last data filed at 01/07/2023 0400 Gross per 24 hour  Intake 536.33 ml  Output 399 ml  Net 137.33 ml   Filed Weights   01/06/23 0036  Weight: 94.5 kg   Exam: General:  Alert, oriented, calm, in no acute distress sitting up in bed having breakfast this morning with clear liquids Eyes: EOMI, clear conjuctivae, white sclerea Neck: supple, no masses, trachea mildline  Cardiovascular: RRR, no  murmurs or rubs, no peripheral edema  Respiratory: clear to auscultation bilaterally, no wheezes, no crackles  Abdomen: soft, nontender, nondistended, normal bowel tones heard  Skin: dry, no rashes  Musculoskeletal: no joint effusions, normal range of motion  Psychiatric: appropriate affect, normal speech  Neurologic: extraocular muscles intact, clear speech, moving all extremities with intact sensorium  Data Reviewed: CBC: Recent Labs  Lab 01/06/23 0035 01/07/23 0428  WBC 4.2 5.6  NEUTROABS 2.7  --   HGB 16.7* 14.9  HCT 52.9* 46.1*  MCV 79.7* 80.3  PLT 196 141*    Basic Metabolic Panel: Recent Labs  Lab 01/06/23 0034 01/06/23 0035 01/06/23 1606 01/07/23 0556  NA  --  137 132* 135  K  --  4.4 5.0 4.3  CL  --  95* 96* 98  CO2  --  24 24 25   GLUCOSE  --  184* 163* 82  BUN  --  23* 22* 19  CREATININE  --  1.21* 1.11* 0.97  CALCIUM  --  11.2* 9.7 9.7  MG 2.4  --   --   --     GFR: Estimated Creatinine Clearance: 74.1 mL/min (by C-G formula based on SCr of 0.97 mg/dL). Liver Function Tests: Recent Labs  Lab 01/06/23 0035  AST 26  ALT 16  ALKPHOS 53  BILITOT 1.5*  PROT 8.3*  ALBUMIN 3.4*    No results for input(s): "LIPASE", "AMYLASE" in the last 168 hours. No results for input(s): "AMMONIA" in the last 168 hours. Coagulation Profile: Recent Labs  Lab 01/06/23 1606  INR 1.2   Cardiac Enzymes: Recent Labs  Lab 01/06/23 0035  CKTOTAL 22*    BNP (last 3 results) No results for input(s): "PROBNP" in the last 8760 hours. HbA1C: Recent Labs    01/06/23 0547  HGBA1C 8.2*   CBG: Recent Labs  Lab 01/06/23 1148 01/06/23 1628 01/06/23 2319 01/07/23 0349 01/07/23 0756  GLUCAP 121* 164* 95 73 77    Lipid Profile: No results for input(s): "CHOL", "HDL", "LDLCALC", "TRIG", "CHOLHDL", "LDLDIRECT" in the last 72 hours. Thyroid Function Tests: Recent Labs    01/06/23 0034  TSH 0.689    Anemia Panel: No results for input(s): "VITAMINB12",  "FOLATE", "FERRITIN", "TIBC", "IRON", "RETICCTPCT" in the last 72 hours. Urine analysis:    Component Value Date/Time   COLORURINE YELLOW (A) 01/06/2023 0034   APPEARANCEUR TURBID (A) 01/06/2023 0034   APPEARANCEUR Cloudy 08/10/2013 1229   LABSPEC 1.019 01/06/2023 0034   LABSPEC 1.024 08/10/2013 1229   PHURINE 5.0 01/06/2023 0034   GLUCOSEU NEGATIVE 01/06/2023 0034   GLUCOSEU Negative 08/10/2013 1229   HGBUR SMALL (A) 01/06/2023 0034   BILIRUBINUR NEGATIVE 01/06/2023 0034   BILIRUBINUR Negative 08/10/2013 1229   KETONESUR 5 (A) 01/06/2023 0034   PROTEINUR 100 (A) 01/06/2023 0034   UROBILINOGEN 0.2 09/06/2010 1027   NITRITE NEGATIVE 01/06/2023 0034   LEUKOCYTESUR MODERATE (A) 01/06/2023 0034   LEUKOCYTESUR 3+ 08/10/2013 1229   Sepsis Labs: @LABRCNTIP (procalcitonin:4,lacticidven:4)  ) Recent Results (from the past 240 hour(s))  Urine Culture (for pregnant,  neutropenic or urologic patients or patients with an indwelling urinary catheter)     Status: None (Preliminary result)   Collection Time: 01/06/23 12:35 AM   Specimen: Urine, Clean Catch  Result Value Ref Range Status   Specimen Description   Final    URINE, CLEAN CATCH Performed at [facility], [address], [place], [place] [address]    Special Requests   Final    NONE Performed at [facility], [address], [place], [place] [address]    Culture   Final    CULTURE REINCUBATED FOR BETTER GROWTH Performed at [facility], [place] [address] [place], [place] [address]    Report Status PENDING  Incomplete  Resp Panel by RT-PCR (Flu A&B, Covid) Anterior Nasal Swab     Status: None   Collection Time: 01/06/23  1:17 AM   Specimen: Anterior Nasal Swab  Result Value Ref Range Status   SARS Coronavirus 2 by RT PCR NEGATIVE NEGATIVE Final    Comment: (NOTE) SARS-CoV-2 target nucleic acids are NOT DETECTED.  The SARS-CoV-2 RNA is generally detectable in upper respiratory specimens during the  acute phase of infection. The lowest concentration of SARS-CoV-2 viral copies this assay can detect is 138 copies/mL. A negative result does not preclude SARS-Cov-2 infection and should not be used as the sole basis for treatment or other patient management decisions. A negative result may occur with  improper specimen collection/handling, submission of specimen other than nasopharyngeal swab, presence of viral mutation(s) within the areas targeted by this assay, and inadequate number of viral copies(<138 copies/mL). A negative result must be combined with clinical observations, patient history, and epidemiological information. The expected result is Negative.  Fact Sheet for Patients:  EntrepreneurPulse.com.au  Fact Sheet for Healthcare Providers:  IncredibleEmployment.be  This test is no t yet approved or cleared by the [place] FDA and  has been authorized for detection and/or diagnosis of SARS-CoV-2 by FDA under an Emergency Use Authorization (EUA). This EUA will remain  in effect (meaning this test can be used) for the duration of the COVID-19 declaration under Section 564(b)(1) of the Act, 21 U.S.C.section 360bbb-3(b)(1), unless the authorization is terminated  or revoked sooner.       Influenza A by PCR NEGATIVE NEGATIVE Final   Influenza B by PCR NEGATIVE NEGATIVE Final    Comment: (NOTE) The Xpert Xpress SARS-CoV-2/FLU/RSV plus assay is intended as an aid in the diagnosis of influenza from Nasopharyngeal swab specimens and should not be used as a sole basis for treatment. Nasal washings and aspirates are unacceptable for Xpert Xpress SARS-CoV-2/FLU/RSV testing.  Fact Sheet for Patients: EntrepreneurPulse.com.au  Fact Sheet for Healthcare Providers: IncredibleEmployment.be  This test is not yet approved or cleared by the [place] FDA and has been authorized for detection and/or  diagnosis of SARS-CoV-2 by FDA under an Emergency Use Authorization (EUA). This EUA will remain in effect (meaning this test can be used) for the duration of the COVID-19 declaration under Section 564(b)(1) of the Act, 21 U.S.C. section 360bbb-3(b)(1), unless the authorization is terminated or revoked.  Performed at [facility], [address], [place], [place] [address]      Studies: No results found.  Scheduled Meds:  (feeding supplement) PROSource Plus  30 mL Oral TID BM   carvedilol  3.125 mg Oral BID WC   feeding supplement  1 Container Oral TID BM   insulin aspart  0-15 Units Subcutaneous Q4H   multivitamin with minerals  1 tablet Oral Daily   pantoprazole (PROTONIX) IV  40 mg Intravenous Q12H   polyethylene glycol-electrolytes  4,000 mL Oral Once    Continuous Infusions:  cefTRIAXone (ROCEPHIN)  IV Stopped (01/07/23 0235)   heparin       LOS: 1 day   Time spent: 24 minutes  Wendy Staub Neva Seat, MD [facility] Pager [phone]  If 7PM-7AM, please contact night-coverage [email or web address] Password [facility] 01/07/2023, 12:15 PM

## 2023-01-07 NOTE — Consult Note (Signed)
[place] for IV Heparin Indication:  New Onset Atrial Fibrillation  Patient Measurements: Height: 5\' 6"  (167.6 cm) Weight: 94.5 kg (208 lb 4.8 oz) IBW/kg (Calculated) : 59.3 Heparin Dosing Weight: 80.2 kg  Labs: Recent Labs    01/06/23 0034 01/06/23 0035 01/06/23 0333 01/06/23 1606 01/06/23 2317 01/07/23 0428 01/07/23 0556 01/07/23 1935  HGB  --  16.7*  --   --   --  14.9  --   --   HCT  --  52.9*  --   --   --  46.1*  --   --   PLT  --  196  --   --   --  141*  --   --   APTT  --   --   --  30  --   --   --   --   LABPROT  --   --   --  15.1  --   --   --   --   INR  --   --   --  1.2  --   --   --   --   HEPARINUNFRC  --   --   --  <0.10* 1.04*  --   --  0.26*  CREATININE  --  1.21*  --  1.11*  --   --  0.97  --   CKTOTAL  --  22*  --   --   --   --   --   --   TROPONINIHS 93*  --  84*  --   --   --   --   --      Estimated Creatinine Clearance: 74.1 mL/min (by C-G formula based on SCr of 0.97 mg/dL).   Medical History: Past Medical History:  Diagnosis Date   CHF (congestive heart failure) ([place])    Diabetes mellitus without complication (HCC)    Hypertension     Medications:  No history of chronic anticoagulant use PTA  Assessment: 57 year old female who has a past medical history of hypertension, diabetes, and congestive heart failure presenting to the ED with nausea and vomiting. Upon further assessment and EKG results, was found to have new onset atrial fibrillation. Pharmacy has been consulted to initiation and titrate heparin infusion. Baseline labs have been ordered and are pending.  Goal of Therapy:  Heparin level 0.3-0.7 units/ml Monitor platelets by anticoagulation protocol: Yes   2/5 2317 HL 1.04, supratherapeutic, 1200 un/hr 2/6 1200 - resume heparin gtt now and stop at mid-night per MD orders (colonoscopy tomorrow 2/7) 2/6 1935 HL 0.26, subtherapeutic; 1050 un/hr  Plan:  Heparin level is  subtherapeutic Will increase heparin infusion to 1150 units/hr given previously supratherapeutic at 1200 units/hr, no bolus given recent procedure No need to re-check level since drip to be stopped at midnight for colonoscopy tomorrow AM Continue to monitor H&H and platelets  Benita Gutter  01/07/2023 7:51 PM

## 2023-01-08 ENCOUNTER — Inpatient Hospital Stay: Payer: Medicaid Other | Admitting: Certified Registered Nurse Anesthetist

## 2023-01-08 ENCOUNTER — Encounter: Payer: Self-pay | Admitting: Gastroenterology

## 2023-01-08 ENCOUNTER — Inpatient Hospital Stay: Payer: Medicaid Other

## 2023-01-08 ENCOUNTER — Encounter
Admission: EM | Disposition: A | Discharge status: Home / Self Care | Payer: Self-pay | Source: Home / Self Care | Attending: Student

## 2023-01-08 DIAGNOSIS — I4819 Other persistent atrial fibrillation: Secondary | ICD-10-CM

## 2023-01-08 DIAGNOSIS — I160 Hypertensive urgency: Secondary | ICD-10-CM

## 2023-01-08 DIAGNOSIS — I429 Cardiomyopathy, unspecified: Secondary | ICD-10-CM

## 2023-01-08 DIAGNOSIS — R634 Abnormal weight loss: Secondary | ICD-10-CM

## 2023-01-08 DIAGNOSIS — R1110 Vomiting, unspecified: Secondary | ICD-10-CM | POA: Diagnosis not present

## 2023-01-08 DIAGNOSIS — E119 Type 2 diabetes mellitus without complications: Secondary | ICD-10-CM

## 2023-01-08 DIAGNOSIS — N179 Acute kidney failure, unspecified: Secondary | ICD-10-CM

## 2023-01-08 DIAGNOSIS — R1310 Dysphagia, unspecified: Secondary | ICD-10-CM

## 2023-01-08 DIAGNOSIS — N390 Urinary tract infection, site not specified: Secondary | ICD-10-CM | POA: Diagnosis not present

## 2023-01-08 DIAGNOSIS — I5021 Acute systolic (congestive) heart failure: Secondary | ICD-10-CM

## 2023-01-08 HISTORY — PX: COLONOSCOPY WITH PROPOFOL: SHX5780

## 2023-01-08 LAB — BASIC METABOLIC PANEL
Anion gap: 10 (ref 5–15)
BUN: 20 mg/dL (ref 6–20)
CO2: 23 mmol/L (ref 22–32)
Calcium: 9.4 mg/dL (ref 8.9–10.3)
Chloride: 101 mmol/L (ref 98–111)
Creatinine, Ser: 0.96 mg/dL (ref 0.44–1.00)
GFR, Estimated: >60 mL/min (ref >60)
Glucose, Bld: 104 mg/dL — ABNORMAL HIGH (ref 70–99)
Potassium: 3.6 mmol/L (ref 3.5–5.1)
Sodium: 134 mmol/L — ABNORMAL LOW (ref 135–145)

## 2023-01-08 LAB — GLUCOSE, CAPILLARY
Glucose-Capillary: 134 mg/dL — ABNORMAL HIGH (ref 70–99)
Glucose-Capillary: 158 mg/dL — ABNORMAL HIGH (ref 70–99)
Glucose-Capillary: 82 mg/dL (ref 70–99)
Glucose-Capillary: 88 mg/dL (ref 70–99)
Glucose-Capillary: 97 mg/dL (ref 70–99)
Glucose-Capillary: 97 mg/dL (ref 70–99)

## 2023-01-08 LAB — CBC
HCT: 44.3 % (ref 36.0–46.0)
Hemoglobin: 14.2 g/dL (ref 12.0–15.0)
MCH: 25.5 pg — ABNORMAL LOW (ref 26.0–34.0)
MCHC: 32.1 g/dL (ref 30.0–36.0)
MCV: 79.5 fL — ABNORMAL LOW (ref 80.0–100.0)
Platelets: 139 10*3/uL — ABNORMAL LOW (ref 150–400)
RBC: 5.57 MIL/uL — ABNORMAL HIGH (ref 3.87–5.11)
RDW: 17.4 % — ABNORMAL HIGH (ref 11.5–15.5)
WBC: 3 10*3/uL — ABNORMAL LOW (ref 4.0–10.5)
nRBC: 0 % (ref 0.0–0.2)

## 2023-01-08 LAB — SURGICAL PATHOLOGY

## 2023-01-08 SURGERY — COLONOSCOPY WITH PROPOFOL
Anesthesia: General

## 2023-01-08 MED ORDER — STERILE WATER FOR IRRIGATION IR SOLN
Status: DC | PRN
  Administered 2023-01-08: 120 mL

## 2023-01-08 MED ORDER — EPHEDRINE 5 MG/ML INJ
INTRAVENOUS | Status: AC
  Filled 2023-01-08: qty 5

## 2023-01-08 MED ORDER — LOSARTAN POTASSIUM 25 MG PO TABS
12.5000 mg | ORAL_TABLET | Freq: Every day | ORAL | Status: DC
  Administered 2023-01-08 – 2023-01-09 (×2): 12.5 mg via ORAL
  Filled 2023-01-08 (×2): qty 1

## 2023-01-08 MED ORDER — LIDOCAINE HCL (CARDIAC) PF 100 MG/5ML IV SOSY
PREFILLED_SYRINGE | INTRAVENOUS | Status: DC | PRN
  Administered 2023-01-08: 20 mg via INTRAVENOUS

## 2023-01-08 MED ORDER — PHENYLEPHRINE HCL (PRESSORS) 10 MG/ML IV SOLN
INTRAVENOUS | Status: DC | PRN
  Administered 2023-01-08 (×2): 80 ug via INTRAVENOUS
  Administered 2023-01-08: 200 ug via INTRAVENOUS
  Administered 2023-01-08: 120 ug via INTRAVENOUS

## 2023-01-08 MED ORDER — VASOPRESSIN 20 UNIT/ML IV SOLN
INTRAVENOUS | Status: DC | PRN
  Administered 2023-01-08 (×3): 1 [IU] via INTRAVENOUS

## 2023-01-08 MED ORDER — PROPOFOL 500 MG/50ML IV EMUL
INTRAVENOUS | Status: DC | PRN
  Administered 2023-01-08: 125 ug/kg/min via INTRAVENOUS

## 2023-01-08 MED ORDER — HEPARIN (PORCINE) 25000 UT/250ML-% IV SOLN
1300.0000 [IU]/h | INTRAVENOUS | Status: DC
  Administered 2023-01-08: 1150 [IU]/h via INTRAVENOUS
  Filled 2023-01-08: qty 250

## 2023-01-08 MED ORDER — PROPOFOL 10 MG/ML IV BOLUS
INTRAVENOUS | Status: AC
  Filled 2023-01-08: qty 20

## 2023-01-08 MED ORDER — DEXMEDETOMIDINE HCL IN NACL 80 MCG/20ML IV SOLN
INTRAVENOUS | Status: DC | PRN
  Administered 2023-01-08: 8 ug via BUCCAL

## 2023-01-08 MED ORDER — GADOBUTROL 1 MMOL/ML IV SOLN
9.0000 mL | Freq: Once | INTRAVENOUS | Status: AC | PRN
  Administered 2023-01-08: 9 mL via INTRAVENOUS

## 2023-01-08 MED ORDER — SPIRONOLACTONE 12.5 MG HALF TABLET
12.5000 mg | ORAL_TABLET | Freq: Every day | ORAL | Status: DC
  Administered 2023-01-09 – 2023-01-11 (×3): 12.5 mg via ORAL
  Filled 2023-01-08 (×3): qty 1

## 2023-01-08 MED ORDER — EPHEDRINE SULFATE (PRESSORS) 50 MG/ML IJ SOLN
INTRAMUSCULAR | Status: DC | PRN
  Administered 2023-01-08: 7.5 mg via INTRAVENOUS

## 2023-01-08 MED ORDER — LIDOCAINE HCL (PF) 2 % IJ SOLN
INTRAMUSCULAR | Status: AC
  Filled 2023-01-08: qty 5

## 2023-01-08 MED ORDER — PHENYLEPHRINE 80 MCG/ML (10ML) SYRINGE FOR IV PUSH (FOR BLOOD PRESSURE SUPPORT): PREFILLED_SYRINGE | INTRAVENOUS | Status: DC | PRN

## 2023-01-08 MED ORDER — PROPOFOL 10 MG/ML IV BOLUS
INTRAVENOUS | Status: DC | PRN
  Administered 2023-01-08: 30 mg via INTRAVENOUS
  Administered 2023-01-08: 20 mg via INTRAVENOUS
  Administered 2023-01-08: 30 mg via INTRAVENOUS
  Administered 2023-01-08: 20 mg via INTRAVENOUS

## 2023-01-08 MED ORDER — DEXMEDETOMIDINE HCL IN NACL 80 MCG/20ML IV SOLN
INTRAVENOUS | Status: AC
  Filled 2023-01-08: qty 20

## 2023-01-08 MED ORDER — PHENYLEPHRINE 80 MCG/ML (10ML) SYRINGE FOR IV PUSH (FOR BLOOD PRESSURE SUPPORT)
PREFILLED_SYRINGE | INTRAVENOUS | Status: AC
  Filled 2023-01-08: qty 10

## 2023-01-08 NOTE — Care Plan (Signed)
Brief GI Post Op Note  Patient underwent colonoscopy today.  Prep was suboptimal with solid stool noted in areas along with other semiliquid stool that was lavaged and evacuated.  There is no large lesion that was appreciated during colonoscopy that would explain the patient's nausea vomiting or weight loss.  Given her poor prep I do recommend that she repeat a colonoscopy within the next couple months.  CT was reviewed with no other colon or upper GI findings.  There were 2 liver lesions noted with radiologist recommending MRI of the liver to be performed with and without contrast.  Will order this along with AFP.  Advance diet as tolerated after MRI  Will follow-up MRI, but no further GI intervention planned at this time.  Patient reports her nausea and vomiting have improved.  These may have been related to her acute illness with atrial fibrillation RVR and new diagnosis of heart failure.  Ronne Binning, [facility]

## 2023-01-08 NOTE — Progress Notes (Signed)
PROGRESS NOTE  [name] EVO:[id number] DOB: 1966/07/12   PCP: Kerri Perches, PA-C  Patient is from: Home  DOA: 01/06/2023 LOS: 2  Chief complaints No chief complaint on file.    Brief Narrative / Interim history: 57 year old F with PMH of DM-2, diastolic CHF, hypertension and obesity presenting with prandial emesis, unintentional weight loss and dysphagia for quite some time and admitted for intractable vomiting AKI, unintentional weight loss, UTI and hypertensive urgency.  She also had new onset A-fib with RVR and acute systolic CHF.  Cardiology and GI consulted.  EGD showed gastritis.  Plan for colonoscopy.   In regards to A-fib and CHF, cardiology following.  Not on anticoagulation yet pending colonoscopy.  On low-dose Coreg for A-fib and systolic CHF.  Not on diuretics.  Subjective: Seen and examined earlier this morning.  No major events overnight of this morning.  Feels sleepy and tired but no other complaints.  She denies nausea, vomiting, abdominal pain, chest pain or dyspnea.  Objective: Vitals:   01/08/23 0347 01/08/23 0817 01/08/23 1205 01/08/23 1347  BP: (!) 143/97 (!) 147/106 (!) 139/97 (!) 173/94  Pulse: 83 85 78 63  Resp: 18 18 18 16   Temp: 97.9 F (36.6 C) 98.6 F (37 C) (!) 97.5 F (36.4 C) (!) 96 F (35.6 C)  TempSrc:  Oral Oral Temporal  SpO2: 100% 99% 97%   Weight:      Height:        Examination:  GENERAL: No apparent distress.  Nontoxic. HEENT: MMM.  Vision and hearing grossly intact.  NECK: Supple.  No apparent JVD.  RESP:  No IWOB.  Fair aeration bilaterally. CVS:  RRR. Heart sounds normal.  ABD/GI/GU: BS+. Abd soft, NTND.  MSK/EXT:  Moves extremities. No apparent deformity.  Trace BLE edema. SKIN: no apparent skin lesion or wound NEURO: Sleepy but wakes to voice.  Oriented x 4.  No apparent focal neuro deficit. PSYCH: Calm. Normal affect.   Procedures:  2/6-EGD showed gastritis that was biopsied and LA grade a esophagitis  without bleeding  Microbiology summarized: COVID-19, influenza and RSV PCR nonreactive. Urine culture with E. coli.  Assessment and plan: Principal Problem:   Intractable vomiting Active Problems:   Dysphagia   High anion gap metabolic acidosis   Lactic acidosis   Urinary tract infection   Hypercalcemia   Unintentional weight loss   AKI (acute kidney injury) ([place])   Hypertensive urgency   Elevated troponin   Diabetes mellitus without complication (HCC)   Persistent atrial fibrillation (HCC)   Acute HFrEF (heart failure with reduced ejection fraction) (HCC)  Intractable vomiting/dysphagia/unintentional weight loss: Unclear etiology of this.  Gastroparesis?  EGD with gastritis and esophagitis.  Vomiting seems to have resolved. -Plan for colonoscopy by GI today -Continue PPI and antiemetics. -Boards trying Reglan if no explanation after colonoscopy. -Barium swallow to assess for motility? -Consult dietitian  Acute systolic CHF: TTE with LVEF of 35 to 40% (previously 55%).  BNP elevated to 553.  Euvolemic except for trace BLE edema.  Not on diuretics. -Cardiology following -Continue Coreg for now. -Plan for [facility] once recovered.  New onset A-fib: Rate controlled.  The vascular score > 4. -Coreg per cardiology. -For anticoagulation  Lactic acidosis/anion gap metabolic acidosis/starvation ketosis: Resolved. -Optimize nutrition after procedure   Urinary tract infection?  Urine culture with E. coli but patient denies UTI symptoms. -Completed 3 days of IV antibiotics   AKI: Resolved. Recent Labs    01/06/23 0035 01/06/23 1606 01/07/23 0556  01/08/23 0643  BUN 23* 22* 19 20  CREATININE 1.21* 1.11* 0.97 0.96  -Continue monitoring  Uncontrolled NIDDM-2: A1c 8.2%.  CBG within acceptable range but NPO.  Seems to be on metformin at home. Recent Labs  Lab 01/07/23 2125 01/08/23 0053 01/08/23 0449 01/08/23 0814 01/08/23 1202  GLUCAP 181* 134* 82 88 97  -Continue current  insulin regimen   Hypercalcemia: Likely secondary.  Resolved.   Hypertensive urgency: BP 189/122 on arrival.  Resolved. -Cardiac meds as above   Elevated troponin: Likely demand ischemia. -Defer to cardiology    Obesity/unintentional weight loss: Likely due to intractable vomiting and dysphagia. Wt Readings from Last 10 Encounters:  01/06/23 94.5 kg  02/01/20 119.1 kg  12/28/19 119.7 kg  12/15/19 120.2 kg  12/15/19 120.2 kg  12/02/19 119.9 kg  11/15/19 119.3 kg  03/01/17 127 kg  07/20/16 113.4 kg  06/30/16 104.3 kg  Body mass index is 33.62 kg/m. Nutrition Problem: Inadequate oral intake Etiology: altered GI function Signs/Symptoms: NPO status Interventions: Boost Breeze, MVI, Prostat   DVT prophylaxis:  On anticoagulation for A-fib.  Code Status: Full code Family Communication: None at bedside Level of care: Progressive Status is: Inpatient Remains inpatient appropriate because: Intractable vomiting, dysphagia, new onset A-fib and acute systolic CHF   Final disposition: TBD Consultants:  Cardiology Gastroenterology  55 minutes with more than 50% spent in reviewing records, counseling patient/family and coordinating care.   Sch Meds:  Scheduled Meds:  [MAR Hold] (feeding supplement) PROSource Plus  30 mL Oral TID BM   [MAR Hold] carvedilol  3.125 mg Oral BID WC   [MAR Hold] feeding supplement  1 Container Oral TID BM   [MAR Hold] insulin aspart  0-15 Units Subcutaneous Q4H   [MAR Hold] multivitamin with minerals  1 tablet Oral Daily   [MAR Hold] pantoprazole (PROTONIX) IV  40 mg Intravenous Q12H   Continuous Infusions:  sodium chloride 50 mL/hr (01/08/23 1346)   [MAR Hold] cefTRIAXone (ROCEPHIN)  IV 2 g (01/08/23 0106)   PRN Meds:.[MAR Hold] acetaminophen **OR** [MAR Hold] acetaminophen, [MAR Hold] hydrALAZINE, [MAR Hold]  morphine injection, [MAR Hold] ondansetron **OR** [MAR Hold] ondansetron (ZOFRAN) IV, [MAR Hold] polyethylene  glycol-electrolytes  Antimicrobials: Anti-infectives (From admission, onward)    Start     Dose/Rate Route Frequency Ordered Stop   01/07/23 0200  [MAR Hold]  cefTRIAXone (ROCEPHIN) 2 g in sodium chloride 0.9 % 100 mL IVPB        (MAR Hold since Wed 01/08/2023 at 1342.Hold Reason: Transfer to a Procedural area)   2 g 200 mL/hr over 30 Minutes Intravenous Every 24 hours 01/06/23 0738 01/11/23 0159   01/06/23 0130  cefTRIAXone (ROCEPHIN) 1 g in sodium chloride 0.9 % 100 mL IVPB        1 g 200 mL/hr over 30 Minutes Intravenous STAT 01/06/23 0126 01/06/23 0300        I have personally reviewed the following labs and images: CBC: Recent Labs  Lab 01/06/23 0035 01/07/23 0428 01/08/23 0643  WBC 4.2 5.6 3.0*  NEUTROABS 2.7  --   --   HGB 16.7* 14.9 14.2  HCT 52.9* 46.1* 44.3  MCV 79.7* 80.3 79.5*  PLT 196 141* 139*   BMP &GFR Recent Labs  Lab 01/06/23 0034 01/06/23 0035 01/06/23 1606 01/07/23 0556 01/08/23 0643  NA  --  137 132* 135 134*  K  --  4.4 5.0 4.3 3.6  CL  --  95* 96* 98 101  CO2  --  24 24 25 23   GLUCOSE  --  184* 163* 82 104*  BUN  --  23* 22* 19 20  CREATININE  --  1.21* 1.11* 0.97 0.96  CALCIUM  --  11.2* 9.7 9.7 9.4  MG 2.4  --   --   --   --    Estimated Creatinine Clearance: 74.9 mL/min (by C-G formula based on SCr of 0.96 mg/dL). Liver & Pancreas: Recent Labs  Lab 01/06/23 0035  AST 26  ALT 16  ALKPHOS 53  BILITOT 1.5*  PROT 8.3*  ALBUMIN 3.4*   No results for input(s): "LIPASE", "AMYLASE" in the last 168 hours. No results for input(s): "AMMONIA" in the last 168 hours. Diabetic: Recent Labs    01/06/23 0547  HGBA1C 8.2*   Recent Labs  Lab 01/07/23 2125 01/08/23 0053 01/08/23 0449 01/08/23 0814 01/08/23 1202  GLUCAP 181* 134* 82 88 97   Cardiac Enzymes: Recent Labs  Lab 01/06/23 0035  CKTOTAL 22*   No results for input(s): "PROBNP" in the last 8760 hours. Coagulation Profile: Recent Labs  Lab 01/06/23 1606  INR 1.2    Thyroid Function Tests: Recent Labs    01/06/23 0034  TSH 0.689   Lipid Profile: No results for input(s): "CHOL", "HDL", "LDLCALC", "TRIG", "CHOLHDL", "LDLDIRECT" in the last 72 hours. Anemia Panel: No results for input(s): "VITAMINB12", "FOLATE", "FERRITIN", "TIBC", "IRON", "RETICCTPCT" in the last 72 hours. Urine analysis:    Component Value Date/Time   COLORURINE YELLOW (A) 01/06/2023 0034   APPEARANCEUR TURBID (A) 01/06/2023 0034   APPEARANCEUR Cloudy 08/10/2013 1229   LABSPEC 1.019 01/06/2023 0034   LABSPEC 1.024 08/10/2013 1229   PHURINE 5.0 01/06/2023 0034   GLUCOSEU NEGATIVE 01/06/2023 0034   GLUCOSEU Negative 08/10/2013 1229   HGBUR SMALL (A) 01/06/2023 0034   BILIRUBINUR NEGATIVE 01/06/2023 0034   BILIRUBINUR Negative 08/10/2013 1229   KETONESUR 5 (A) 01/06/2023 0034   PROTEINUR 100 (A) 01/06/2023 0034   UROBILINOGEN 0.2 09/06/2010 1027   NITRITE NEGATIVE 01/06/2023 0034   LEUKOCYTESUR MODERATE (A) 01/06/2023 0034   LEUKOCYTESUR 3+ 08/10/2013 1229   Sepsis Labs: Invalid input(s): "PROCALCITONIN", "LACTICIDVEN"  Microbiology: Recent Results (from the past 240 hour(s))  Urine Culture (for pregnant, neutropenic or urologic patients or patients with an indwelling urinary catheter)     Status: Abnormal (Preliminary result)   Collection Time: 01/06/23 12:35 AM   Specimen: Urine, Clean Catch  Result Value Ref Range Status   Specimen Description   Final    URINE, CLEAN CATCH Performed at [facility], [address], [place], [place]    Special Requests   Final    NONE Performed at [facility], [address] [place], [place]    Culture (A)  Final    >=100,000 COLONIES/mL ESCHERICHIA COLI SUSCEPTIBILITIES TO FOLLOW CULTURE REINCUBATED FOR BETTER GROWTH Performed at [facility], [address], [place], [place]    Report Status PENDING  Incomplete  Resp Panel by RT-PCR (Flu A&B, Covid) Anterior  Nasal Swab     Status: None   Collection Time: 01/06/23  1:17 AM   Specimen: Anterior Nasal Swab  Result Value Ref Range Status   SARS Coronavirus 2 by RT PCR NEGATIVE NEGATIVE Final    Comment: (NOTE) SARS-CoV-2 target nucleic acids are NOT DETECTED.  The SARS-CoV-2 RNA is generally detectable in upper respiratory specimens during the acute phase of infection. The lowest concentration of SARS-CoV-2 viral copies this assay can detect  is 138 copies/mL. A negative result does not preclude SARS-Cov-2 infection and should not be used as the sole basis for treatment or other patient management decisions. A negative result may occur with  improper specimen collection/handling, submission of specimen other than nasopharyngeal swab, presence of viral mutation(s) within the areas targeted by this assay, and inadequate number of viral copies(<138 copies/mL). A negative result must be combined with clinical observations, patient history, and epidemiological information. The expected result is Negative.  Fact Sheet for Patients:  BloggerCourse.com  Fact Sheet for Healthcare Providers:  SeriousBroker.it  This test is no t yet approved or cleared by the [place] FDA and  has been authorized for detection and/or diagnosis of SARS-CoV-2 by FDA under an Emergency Use Authorization (EUA). This EUA will remain  in effect (meaning this test can be used) for the duration of the COVID-19 declaration under Section 564(b)(1) of the Act, 21 U.S.C.section 360bbb-3(b)(1), unless the authorization is terminated  or revoked sooner.       Influenza A by PCR NEGATIVE NEGATIVE Final   Influenza B by PCR NEGATIVE NEGATIVE Final    Comment: (NOTE) The Xpert Xpress SARS-CoV-2/FLU/RSV plus assay is intended as an aid in the diagnosis of influenza from Nasopharyngeal swab specimens and should not be used as a sole basis for treatment. Nasal washings  and aspirates are unacceptable for Xpert Xpress SARS-CoV-2/FLU/RSV testing.  Fact Sheet for Patients: BloggerCourse.com  Fact Sheet for Healthcare Providers: SeriousBroker.it  This test is not yet approved or cleared by the [place] FDA and has been authorized for detection and/or diagnosis of SARS-CoV-2 by FDA under an Emergency Use Authorization (EUA). This EUA will remain in effect (meaning this test can be used) for the duration of the COVID-19 declaration under Section 564(b)(1) of the Act, 21 U.S.C. section 360bbb-3(b)(1), unless the authorization is terminated or revoked.  Performed at [facility], [address] [place], [place] [address]     Radiology Studies: CT CHEST ABDOMEN PELVIS W CONTRAST  Result Date: 01/07/2023 CLINICAL DATA:  Metastatic disease evaluation. Unintentional weight loss with nausea. Patient is status post benign endoscopy. Status post colonoscopy today for which results are not available. EXAM: CT CHEST, ABDOMEN, AND PELVIS WITH CONTRAST TECHNIQUE: Multidetector CT imaging of the chest, abdomen and pelvis was performed following the standard protocol during bolus administration of intravenous contrast. RADIATION DOSE REDUCTION: This exam was performed according to the departmental dose-optimization program which includes automated exposure control, adjustment of the mA and/or kV according to patient size and/or use of iterative reconstruction technique. CONTRAST:  OMNIPAQUE IOHEXOL 300 MG/ML  SOLN COMPARISON:  None Available. FINDINGS: CT CHEST FINDINGS Cardiovascular: Cardiomegaly. No central pulmonary embolism. Thoracic aorta is normal in course and caliber. Mild enlargement of the main pulmonary artery in relation to the thoracic aorta. No pericardial effusion. Three-vessel coronary artery atherosclerotic calcifications. Aortic valve calcifications. Mediastinum/Nodes: Visualized thyroid  is unremarkable. No axillary or mediastinal adenopathy. Lungs/Pleura: No pleural effusion or pneumothorax. Small amount of debris within the trachea. Mild centrilobular ground-glass opacities at the lung bases, likely the sequela of aspiration. No suspicious pulmonary masses. 2 mm LEFT lower lobe pulmonary nodule, likely infectious or inflammatory (series 4, image 105). Musculoskeletal: No acute osseous abnormality. CT ABDOMEN PELVIS FINDINGS Hepatobiliary: Hepatic steatosis. In the hepatic segment 4, there is a hypodense mass which measures approximately 23 mm (series 2, image 52). Additional 17 mm hypodensity adjacent to the falciform ligament may reflect focal fatty deposition but is somewhat expansile in  appearance (series 2, image 59). Gallbladder is unremarkable. No intrahepatic or extrahepatic biliary ductal dilation. Pancreas: Unremarkable. No pancreatic ductal dilatation or surrounding inflammatory changes. Spleen: Normal in size without focal abnormality. Adrenals/Urinary Tract: Adrenal glands are unremarkable. Kidneys enhance symmetrically. No hydronephrosis. Subcentimeter hypodense lesions are too small to accurately characterize, but statistically likely cysts. Benign cyst of the inferior pole of the LEFT kidney (for which no dedicated imaging follow-up is recommended). Bladder is decompressed. Stomach/Bowel: No evidence of bowel obstruction. Scattered diverticulosis without evidence of acute diverticulitis. Appendix is normal. Stomach is unremarkable. Vascular/Lymphatic: Mild atherosclerotic calcifications of the nonaneurysmal abdominal aorta. No suspicious lymphadenopathy. Reproductive: Fibroid uterus. Endometrium is mildly prominent in a postmenopausal setting measuring an estimated 9 mm. Symmetric adnexa. Other: Small fat containing periumbilical hernia. No free air or free fluid. Musculoskeletal: Degenerative changes of the lumbar spine. IMPRESSION: 1. There are 2 indeterminate hypodense hepatic  lesions. Recommend further evaluation with dedicated liver MRI with and without contrast. 2. Fibroid uterus. Endometrium is mildly prominent in a postmenopausal setting measuring an estimated 9 mm. Recommend correlation with pelvic ultrasound. 3. Mild enlargement of the main pulmonary artery in relation to the thoracic aorta. This can be seen in the setting of pulmonary arterial hypertension. 4. Hepatic steatosis. 5. Mild centrilobular ground-glass opacities at the lung bases, likely the sequela of aspiration. 6. 2 mm LEFT lower lobe pulmonary nodule, likely infectious or inflammatory. No follow-up needed if patient is low-risk.This recommendation follows the consensus statement: Guidelines for Management of Incidental Pulmonary Nodules Detected on CT Images: From the Fleischner Society 2017; Radiology 2017; 284:228-243. Aortic Atherosclerosis (ICD10-I70.0). Electronically Signed   By: Valentino Saxon M.D.   On: 01/07/2023 18:37      Nathon Stefanski T. [place]  If 7PM-7AM, please contact night-coverage [email or web address] 01/08/2023, 1:55 PM

## 2023-01-08 NOTE — Anesthesia Preprocedure Evaluation (Signed)
Anesthesia Evaluation  Patient identified by MRN, date of birth, ID band Patient awake    Reviewed: Allergy & Precautions, NPO status , Patient's Chart, lab work & pertinent test results  Airway Mallampati: III  TM Distance: >3 FB Neck ROM: full    Dental  (+) Poor Dentition, Loose, Missing, Dental Advisory Given   Pulmonary neg pulmonary ROS, Recent URI , Residual Cough   Pulmonary exam normal breath sounds clear to auscultation       Cardiovascular Exercise Tolerance: Poor hypertension, Pt. on medications +CHF and + DOE  negative cardio ROS Normal cardiovascular exam Rhythm:Regular     Neuro/Psych negative neurological ROS  negative psych ROS   GI/Hepatic negative GI ROS, Neg liver ROS,,,  Endo/Other  negative endocrine ROSdiabetes, Type 2, Oral Hypoglycemic Agents    Renal/GU negative Renal ROS  negative genitourinary   Musculoskeletal negative musculoskeletal ROS (+)    Abdominal  (+) + obese  Peds  Hematology negative hematology ROS (+)   Anesthesia Other Findings Past Medical History: No date: CHF (congestive heart failure) (HCC) No date: Diabetes mellitus without complication (HCC) No date: Hypertension  Past Surgical History: 01/07/2023: ESOPHAGOGASTRODUODENOSCOPY; N/A     Comment:  Procedure: ESOPHAGOGASTRODUODENOSCOPY (EGD);  Surgeon:               Annamaria Helling, DO;  Location[facility] ENDOSCOPY;                Service: Gastroenterology;  Laterality: N/A;  BMI    Body Mass Index: 33.62 kg/m      Reproductive/Obstetrics negative OB ROS                             Anesthesia Physical Anesthesia Plan  ASA: 3  Anesthesia Plan: General   Post-op Pain Management:    Induction: Intravenous  PONV Risk Score and Plan: Propofol infusion and TIVA  Airway Management Planned: Natural Airway  Additional Equipment:   Intra-op Plan:   Post-operative Plan:    Informed Consent: I have reviewed the patients History and Physical, chart, labs and discussed the procedure including the risks, benefits and alternatives for the proposed anesthesia with the patient or authorized representative who has indicated his/her understanding and acceptance.     Dental Advisory Given  Plan Discussed with: CRNA and Surgeon  Anesthesia Plan Comments:        Anesthesia Quick Evaluation

## 2023-01-08 NOTE — Interval H&P Note (Signed)
History and Physical Interval Note: Consult Note from 01/06/23 reviewed. Patient's hgb has remained stable. Her nausea has improved as has her vomiting. Plt and WBC downtrending. Pt completed golytely prep. NPO since midnight Imaging yesterday noted two lesions on liver which will undergo further workup. No other interval change after seeing and examining the patient.  Written consent was obtained from the patient after discussion of risks, benefits, and alternatives. Patient has consented to proceed with Colonoscopy with possible intervention  Colonoscopy with possible biopsy, control of bleeding, polypectomy, and interventions as necessary has been discussed with the patient/patient representative. Informed consent was obtained from the patient/patient representative after explaining the indication, nature, and risks of the procedure including but not limited to death, bleeding, perforation, missed neoplasm/lesions, cardiorespiratory compromise, and reaction to medications. Opportunity for questions was given and appropriate answers were provided. Patient/patient representative has verbalized understanding is amenable to undergoing the procedure.    01/08/2023 1:57 PM  [name]  has presented today for surgery, with the diagnosis of abnormal weight loss.  The various methods of treatment have been discussed with the patient and family. After consideration of risks, benefits and other options for treatment, the patient has consented to  Procedure(s): COLONOSCOPY WITH PROPOFOL (N/A) as a surgical intervention.  The patient's history has been reviewed, patient examined, no change in status, stable for surgery.  I have reviewed the patient's chart and labs.  Questions were answered to the patient's satisfaction.     Annamaria Helling

## 2023-01-08 NOTE — Consult Note (Signed)
ANTICOAGULATION CONSULT NOTE   Pharmacy Consult for IV Heparin Indication:  New Onset Atrial Fibrillation  Patient Measurements: Height: 5\' 6"  (167.6 cm) Weight: 94.5 kg (208 lb 4.8 oz) IBW/kg (Calculated) : 59.3 Heparin Dosing Weight: 80.2 kg  Labs: Recent Labs    01/06/23 0034 01/06/23 0035 01/06/23 0035 01/06/23 0333 01/06/23 1606 01/06/23 2317 01/07/23 0428 01/07/23 0556 01/07/23 1935 01/08/23 0643  HGB  --  16.7*   < >  --   --   --  14.9  --   --  14.2  HCT  --  52.9*  --   --   --   --  46.1*  --   --  44.3  PLT  --  196  --   --   --   --  141*  --   --  139*  APTT  --   --   --   --  30  --   --   --   --   --   LABPROT  --   --   --   --  15.1  --   --   --   --   --   INR  --   --   --   --  1.2  --   --   --   --   --   HEPARINUNFRC  --   --   --   --  <0.10* 1.04*  --   --  0.26*  --   CREATININE  --  1.21*   < >  --  1.11*  --   --  0.97  --  0.96  CKTOTAL  --  22*  --   --   --   --   --   --   --   --   TROPONINIHS 93*  --   --  84*  --   --   --   --   --   --    < > = values in this interval not displayed.     Estimated Creatinine Clearance: 74.9 mL/min (by C-G formula based on SCr of 0.96 mg/dL).   Medical History: Past Medical History:  Diagnosis Date   CHF (congestive heart failure) ([place])    Diabetes mellitus without complication (HCC)    Hypertension     Medications:  No history of chronic anticoagulant use PTA  Assessment: 57 year old female who has a past medical history of hypertension, diabetes, and congestive heart failure presenting to the ED with nausea and vomiting. Upon further assessment and EKG results, was found to have new onset atrial fibrillation. Pharmacy has been consulted to initiation and titrate heparin infusion. Baseline labs have been ordered and are pending.  Goal of Therapy:  Heparin level 0.3-0.7 units/ml Monitor platelets by anticoagulation protocol: Yes   2/5 2317 HL 1.04, supratherapeutic, 1200 un/hr 2/6  1200 - resume heparin gtt now and stop at mid-night per MD orders (colonoscopy tomorrow 2/7) 2/6 1935 HL 0.26, subtherapeutic; 1050 un/hr 2/7 Heparin drip held for colonoscopy. Restarted at [place]:  Per discussion with GI: Will reorder heparin infusion at 1150 units/hr, no bolus given recent procedure Will heck HL in 6 hrs Continue to monitor H&H and platelets per protocol  Jaretssi Kraker A  01/08/2023 6:43 PM

## 2023-01-08 NOTE — Op Note (Signed)
[facility] Patient Name: [name] Procedure Date: 01/08/2023 1:46 PM MRN: [id number] Account #: [id number] Date of Birth: 1966-08-18 Admit Type: Inpatient Age: 57 Room: [facility] ENDO ROOM 3 Gender: Female Note Status: Finalized Instrument Name: Colonoscope [id number] Procedure:             Colonoscopy Indications:           Weight loss Providers:             Rueben Bash, DO Referring MD:          Kerri Perches PA Medicines:             Monitored Anesthesia Care Complications:         No immediate complications. Estimated blood loss: None. Procedure:             Pre-Anesthesia Assessment:                        - Prior to the procedure, a History and Physical was                         performed, and patient medications and allergies were                         reviewed. The patient is competent. The risks and                         benefits of the procedure and the sedation options and                         risks were discussed with the patient. All questions                         were answered and informed consent was obtained.                         Patient identification and proposed procedure were                         verified by the physician, the nurse, the anesthetist                         and the technician in the endoscopy suite. Mental                         Status Examination: alert and oriented. Airway                         Examination: normal oropharyngeal airway and neck                         mobility. Respiratory Examination: poor air movement.                         CV Examination: irregularly irregular rate and rhythm.                         Prophylactic Antibiotics: The patient does not require  prophylactic antibiotics. Prior Anticoagulants: The                         patient has taken heparin, last dose was day of                         procedure. ASA Grade Assessment: III -  A patient with                         severe systemic disease. After reviewing the risks and                         benefits, the patient was deemed in satisfactory                         condition to undergo the procedure. The anesthesia                         plan was to use monitored anesthesia care (MAC).                         Immediately prior to administration of medications,                         the patient was re-assessed for adequacy to receive                         sedatives. The heart rate, respiratory rate, oxygen                         saturations, blood pressure, adequacy of pulmonary                         ventilation, and response to care were monitored                         throughout the procedure. The physical status of the                         patient was re-assessed after the procedure.                        After obtaining informed consent, the colonoscope was                         passed under direct vision. Throughout the procedure,                         the patient's blood pressure, pulse, and oxygen                         saturations were monitored continuously. The                         Colonoscope was introduced through the anus and                         advanced to the the terminal ileum, with  identification of the appendiceal orifice and IC                         valve. The colonoscopy was somewhat difficult due to                         poor bowel prep with stool present. Successful                         completion of the procedure was aided by lavage. The                         patient tolerated the procedure. pt with tachycardia,                         irreg irreg rhythm. The quality of the bowel                         preparation was evaluated using the BBPS [facility] Bowel                         Preparation Scale) with scores of: Right Colon = 1                         (portion of mucosa seen, but  other areas not well seen                         due to staining, residual stool and/or opaque liquid),                         Transverse Colon = 1 (portion of mucosa seen, but                         other areas not well seen due to staining, residual                         stool and/or opaque liquid) and Left Colon = 1                         (portion of mucosa seen, but other areas not well seen                         due to staining, residual stool and/or opaque liquid).                         The total BBPS score equals 3. The quality of the                         bowel preparation was inadequate. The terminal ileum,                         ileocecal valve, appendiceal orifice, and rectum were                         photographed. Findings:      The perianal and digital rectal examinations were normal. Pertinent  negatives include normal sphincter tone.      The terminal ileum appeared normal. Estimated blood loss: none.      Extensive amounts of semi-liquid semi-solid solid stool was found in the       entire colon, interfering with visualization. Lavage of the area was       performed using a large amount, resulting in incomplete clearance with       fair visualization. Estimated blood loss: none.      Non-bleeding internal hemorrhoids were found during retroflexion. The       hemorrhoids were Grade I (internal hemorrhoids that do not prolapse).       Estimated blood loss: none.      Multiple small-mouthed diverticula were found in the recto-sigmoid colon       and sigmoid colon. Estimated blood loss: none. Impression:            - Preparation of the colon was inadequate.                        - The examined portion of the ileum was normal.                        - Stool in the entire examined colon.                        - Non-bleeding internal hemorrhoids.                        - Diverticulosis in the recto-sigmoid colon and in the                         sigmoid  colon.                        - No specimens collected. Recommendation:        - Return patient to hospital ward for ongoing care.                        - Advance diet as tolerated and full liquid diet.                        - Continue present medications.                        - Repeat colonoscopy in 2 months because the bowel                         preparation was poor.                        - Return to GI office at appointment to be scheduled.                        - The findings and recommendations were discussed with                         the patient. Procedure Code(s):     --- Professional ---                        [id number], Colonoscopy, flexible; diagnostic,  including                         collection of specimen(s) by brushing or washing, when                         performed (separate procedure) Diagnosis Code(s):     --- Professional ---                        K64.0, First degree hemorrhoids                        R63.4, Abnormal weight loss                        K57.30, Diverticulosis of large intestine without                         perforation or abscess without bleeding CPT copyright 2022 American Medical Association. All rights reserved. The codes documented in this report are preliminary and upon coder review may  be revised to meet current compliance requirements. Attending Participation:      I personally performed the entire procedure. Volney American, DO Annamaria Helling DO, DO 01/08/2023 2:38:07 PM This report has been signed electronically. Number of Addenda: 0 Note Initiated On: 01/08/2023 1:46 PM Scope Withdrawal Time: 0 hours 10 minutes 51 seconds  Total Procedure Duration: 0 hours 19 minutes 14 seconds  Estimated Blood Loss:  Estimated blood loss: none.      [facility]

## 2023-01-08 NOTE — Plan of Care (Signed)

## 2023-01-08 NOTE — Transfer of Care (Addendum)
Immediate Anesthesia Transfer of Care Note  Patient: [name]  Procedure(s) Performed: COLONOSCOPY WITH PROPOFOL  Patient Location: PACU  Anesthesia Type:General  Level of Consciousness: drowsy  Airway & Oxygen Therapy: Patient Spontanous Breathing  Post-op Assessment: Report given to RN and Post -op Vital signs reviewed and stable  Post vital signs: Reviewed and stable  Last Vitals:  Vitals Value Taken Time  BP 128/83 01/08/23 1447  Temp 35.4 C 01/08/23 1447  Pulse 70 01/08/23 1447  Resp 19 01/08/23 1447  SpO2 95 % 01/08/23 1447    Last Pain:  Vitals:   01/08/23 1447  TempSrc: Temporal  PainSc: Asleep         Complications: Pt with hypotension during colonoscopy that was unresponsive to ephedrine and phenylphrine. Vasopressin given with improvement in BP. PACU RN notified of intraoperative events. Pt remains drowsy and will continue to monitor.

## 2023-01-08 NOTE — Progress Notes (Signed)
Rounding Note    Patient Name: [name] Date of Encounter: 01/08/2023  [facility] Cardiologist: New  Subjective   Patient is lethargic during interview. She denies chest pain. No further vomiting.   Inpatient Medications    Scheduled Meds:  (feeding supplement) PROSource Plus  30 mL Oral TID BM   carvedilol  3.125 mg Oral BID WC   feeding supplement  1 Container Oral TID BM   insulin aspart  0-15 Units Subcutaneous Q4H   multivitamin with minerals  1 tablet Oral Daily   pantoprazole (PROTONIX) IV  40 mg Intravenous Q12H   Continuous Infusions:  sodium chloride 50 mL/hr at 01/08/23 0104   cefTRIAXone (ROCEPHIN)  IV 2 g (01/08/23 0106)   PRN Meds: acetaminophen **OR** acetaminophen, hydrALAZINE, morphine injection, ondansetron **OR** ondansetron (ZOFRAN) IV, polyethylene glycol-electrolytes   Vital Signs    Vitals:   01/07/23 1950 01/07/23 2314 01/08/23 0347 01/08/23 0817  BP: (!) 153/95 (!) 170/95 (!) 143/97 (!) 147/106  Pulse: 80 70 83 85  Resp: 18 18 18 18   Temp:  97.7 F (36.5 C) 97.9 F (36.6 C) 98.6 F (37 C)  TempSrc:    Oral  SpO2: 100% 100% 100% 99%  Weight:      Height:        Intake/Output Summary (Last 24 hours) at 01/08/2023 1128 Last data filed at 01/08/2023 0900 Gross per 24 hour  Intake 1086.43 ml  Output 200 ml  Net 886.43 ml      01/06/2023   12:36 AM 02/01/2020   12:33 PM 12/28/2019   12:37 PM  Last 3 Weights  Weight (lbs) 208 lb 4.8 oz 262 lb 9.6 oz 264 lb  Weight (kg) 94.484 kg 119.115 kg 119.75 kg      Telemetry    Afib. HR  70-80s, PVCs, NSVT 34 beats- Personally Reviewed  ECG    No new - Personally Reviewed  Physical Exam   GEN: No acute distress.   Neck: No JVD Cardiac: Irreg Irreg, no murmurs, rubs, or gallops.  Respiratory: Clear to auscultation bilaterally. GI: Soft, nontender, non-distended  MS: No edema; No deformity. Neuro:  Nonfocal  Psych: Normal affect   Labs    High Sensitivity  Troponin:   Recent Labs  Lab 01/06/23 0034 01/06/23 0333  TROPONINIHS 93* 84*     Chemistry Recent Labs  Lab 01/06/23 0034 01/06/23 0035 01/06/23 0035 01/06/23 1606 01/07/23 0556 01/08/23 0643  NA  --  137   < > 132* 135 134*  K  --  4.4   < > 5.0 4.3 3.6  CL  --  95*   < > 96* 98 101  CO2  --  24   < > 24 25 23   GLUCOSE  --  184*   < > 163* 82 104*  BUN  --  23*   < > 22* 19 20  CREATININE  --  1.21*   < > 1.11* 0.97 0.96  CALCIUM  --  11.2*   < > 9.7 9.7 9.4  MG 2.4  --   --   --   --   --   PROT  --  8.3*  --   --   --   --   ALBUMIN  --  3.4*  --   --   --   --   AST  --  26  --   --   --   --   ALT  --  16  --   --   --   --   ALKPHOS  --  53  --   --   --   --   BILITOT  --  1.5*  --   --   --   --   GFRNONAA  --  52*   < > 58* >60 >60  ANIONGAP  --  18*   < > 12 12 10    < > = values in this interval not displayed.    Lipids No results for input(s): "CHOL", "TRIG", "HDL", "LABVLDL", "LDLCALC", "CHOLHDL" in the last 168 hours.  Hematology Recent Labs  Lab 01/06/23 0035 01/07/23 0428 01/08/23 0643  WBC 4.2 5.6 3.0*  RBC 6.64* 5.74* 5.57*  HGB 16.7* 14.9 14.2  HCT 52.9* 46.1* 44.3  MCV 79.7* 80.3 79.5*  MCH 25.2* 26.0 25.5*  MCHC 31.6 32.3 32.1  RDW 18.4* 18.1* 17.4*  PLT 196 141* 139*   Thyroid  Recent Labs  Lab 01/06/23 0034  TSH 0.689    BNP Recent Labs  Lab 01/06/23 0034  BNP 553.3*    DDimer No results for input(s): "DDIMER" in the last 168 hours.   Radiology    CT CHEST ABDOMEN PELVIS W CONTRAST  Result Date: 01/07/2023 CLINICAL DATA:  Metastatic disease evaluation. Unintentional weight loss with nausea. Patient is status post benign endoscopy. Status post colonoscopy today for which results are not available. EXAM: CT CHEST, ABDOMEN, AND PELVIS WITH CONTRAST TECHNIQUE: Multidetector CT imaging of the chest, abdomen and pelvis was performed following the standard protocol during bolus administration of intravenous contrast. RADIATION DOSE  REDUCTION: This exam was performed according to the departmental dose-optimization program which includes automated exposure control, adjustment of the mA and/or kV according to patient size and/or use of iterative reconstruction technique. CONTRAST:  160mL OMNIPAQUE IOHEXOL 300 MG/ML  SOLN COMPARISON:  None Available. FINDINGS: CT CHEST FINDINGS Cardiovascular: Cardiomegaly. No central pulmonary embolism. Thoracic aorta is normal in course and caliber. Mild enlargement of the main pulmonary artery in relation to the thoracic aorta. No pericardial effusion. Three-vessel coronary artery atherosclerotic calcifications. Aortic valve calcifications. Mediastinum/Nodes: Visualized thyroid is unremarkable. No axillary or mediastinal adenopathy. Lungs/Pleura: No pleural effusion or pneumothorax. Small amount of debris within the trachea. Mild centrilobular ground-glass opacities at the lung bases, likely the sequela of aspiration. No suspicious pulmonary masses. 2 mm LEFT lower lobe pulmonary nodule, likely infectious or inflammatory (series 4, image 105). Musculoskeletal: No acute osseous abnormality. CT ABDOMEN PELVIS FINDINGS Hepatobiliary: Hepatic steatosis. In the hepatic segment 4, there is a hypodense mass which measures approximately 23 mm (series 2, image 52). Additional 17 mm hypodensity adjacent to the falciform ligament may reflect focal fatty deposition but is somewhat expansile in appearance (series 2, image 59). Gallbladder is unremarkable. No intrahepatic or extrahepatic biliary ductal dilation. Pancreas: Unremarkable. No pancreatic ductal dilatation or surrounding inflammatory changes. Spleen: Normal in size without focal abnormality. Adrenals/Urinary Tract: Adrenal glands are unremarkable. Kidneys enhance symmetrically. No hydronephrosis. Subcentimeter hypodense lesions are too small to accurately characterize, but statistically likely cysts. Benign cyst of the inferior pole of the LEFT kidney (for which  no dedicated imaging follow-up is recommended). Bladder is decompressed. Stomach/Bowel: No evidence of bowel obstruction. Scattered diverticulosis without evidence of acute diverticulitis. Appendix is normal. Stomach is unremarkable. Vascular/Lymphatic: Mild atherosclerotic calcifications of the nonaneurysmal abdominal aorta. No suspicious lymphadenopathy. Reproductive: Fibroid uterus. Endometrium is mildly prominent in a postmenopausal setting measuring an estimated 9 mm. Symmetric adnexa. Other: Small fat  containing periumbilical hernia. No free air or free fluid. Musculoskeletal: Degenerative changes of the lumbar spine. IMPRESSION: 1. There are 2 indeterminate hypodense hepatic lesions. Recommend further evaluation with dedicated liver MRI with and without contrast. 2. Fibroid uterus. Endometrium is mildly prominent in a postmenopausal setting measuring an estimated 9 mm. Recommend correlation with pelvic ultrasound. 3. Mild enlargement of the main pulmonary artery in relation to the thoracic aorta. This can be seen in the setting of pulmonary arterial hypertension. 4. Hepatic steatosis. 5. Mild centrilobular ground-glass opacities at the lung bases, likely the sequela of aspiration. 6. 2 mm LEFT lower lobe pulmonary nodule, likely infectious or inflammatory. No follow-up needed if patient is low-risk.This recommendation follows the consensus statement: Guidelines for Management of Incidental Pulmonary Nodules Detected on CT Images: From the Fleischner Society 2017; Radiology 2017; 284:228-243. Aortic Atherosclerosis (ICD10-I70.0). Electronically Signed   By: Valentino Saxon M.D.   On: 01/07/2023 18:37    Cardiac Studies   TTE 01/06/23 1. Left ventricular ejection fraction, by estimation, is 35 to 40%. The  left ventricle has moderately decreased function. The left ventricle  demonstrates global hypokinesis. The left ventricular internal cavity size  was moderately dilated. There is mild   left  ventricular hypertrophy. Left ventricular diastolic parameters are  indeterminate.   2. Right ventricular systolic function is normal. The right ventricular  size is normal. Tricuspid regurgitation signal is inadequate for assessing  PA pressure.   3. Left atrial size was moderately dilated.   4. Right atrial size was mildly dilated.   5. The mitral valve is normal in structure. Mild to moderate mitral valve  regurgitation. No evidence of mitral stenosis.   6. The aortic valve is normal in structure. Aortic valve regurgitation is  mild to moderate. Aortic valve sclerosis/calcification is present, without  any evidence of aortic stenosis.   7. Pulmonic valve regurgitation is moderate.   8. Aortic dilatation noted. There is mild dilatation of the aortic root,  measuring 39 mm.   9. The inferior vena cava is normal in size with greater than 50%  respiratory variability, suggesting right atrial pressure of 3 mmHg.   Patient Profile     57 y.o. female with a history of hypertension, diabetes, congestive heart failure, who is being seen and evaluated for HFrEF and new onset atrial fibrillation.    Assessment & Plan    Intractable Vomiting/dysphagia/lactic acidosis/ anion gap metabolic acidosis/starvation ketosis - related to poor oral intake and dehydration - LA improved to 2.2 - no further vomiting - GI completed upper endoscopy without findings of dysphagia  Acute HFrEF - LVEF 35-40% - BNP 553 - Echo showed LVEF 55-60% - continue Coreg - continue GDMT as tolerated - kidney function is normal - appears fairly euvolemic on exam - plan for R/L heart cath once recovered  New onset Afib - rate controlled afib 70-80s - IV heparin - transition to DOAC prior to d/c for CHADSVASC of at least 4 - Plan on 4 weeks of a/c followed by outpatient cardioversion  Elevated troponin - HS troponin elevated to 93 - suspect supply demand mismatch in the setting of multiple acute issues - No  ischemic changes on EKG - Echo showed reduced EF - plan as above  Hypertensive urgency - BP up to 185/104 - restarted on Coreg 3.125mg BID - BP still elevated - can start Entresto   AKI - resolved  For questions or updates, please contact [place] Please consult [email or web address] for contact info under  Signed, Euan Wandler Ninfa Meeker, PA-C  01/08/2023, 11:28 AM

## 2023-01-09 ENCOUNTER — Other Ambulatory Visit (HOSPITAL_COMMUNITY): Payer: Self-pay

## 2023-01-09 ENCOUNTER — Encounter: Payer: Self-pay | Admitting: Gastroenterology

## 2023-01-09 DIAGNOSIS — N179 Acute kidney failure, unspecified: Secondary | ICD-10-CM | POA: Diagnosis not present

## 2023-01-09 DIAGNOSIS — I4819 Other persistent atrial fibrillation: Secondary | ICD-10-CM | POA: Diagnosis not present

## 2023-01-09 DIAGNOSIS — E872 Acidosis, unspecified: Secondary | ICD-10-CM

## 2023-01-09 DIAGNOSIS — R1310 Dysphagia, unspecified: Secondary | ICD-10-CM | POA: Diagnosis not present

## 2023-01-09 DIAGNOSIS — R7989 Other specified abnormal findings of blood chemistry: Secondary | ICD-10-CM

## 2023-01-09 DIAGNOSIS — E119 Type 2 diabetes mellitus without complications: Secondary | ICD-10-CM | POA: Diagnosis not present

## 2023-01-09 DIAGNOSIS — I420 Dilated cardiomyopathy: Secondary | ICD-10-CM

## 2023-01-09 LAB — COMPREHENSIVE METABOLIC PANEL
ALT: 10 U/L (ref 0–44)
AST: 14 U/L — ABNORMAL LOW (ref 15–41)
Albumin: 2.6 g/dL — ABNORMAL LOW (ref 3.5–5.0)
Alkaline Phosphatase: 36 U/L — ABNORMAL LOW (ref 38–126)
Anion gap: 8 (ref 5–15)
BUN: 17 mg/dL (ref 6–20)
CO2: 24 mmol/L (ref 22–32)
Calcium: 9.1 mg/dL (ref 8.9–10.3)
Chloride: 103 mmol/L (ref 98–111)
Creatinine, Ser: 0.88 mg/dL (ref 0.44–1.00)
GFR, Estimated: >60 mL/min (ref >60)
Glucose, Bld: 129 mg/dL — ABNORMAL HIGH (ref 70–99)
Potassium: 3.6 mmol/L (ref 3.5–5.1)
Sodium: 135 mmol/L (ref 135–145)
Total Bilirubin: 1 mg/dL (ref 0.3–1.2)
Total Protein: 5.9 g/dL — ABNORMAL LOW (ref 6.5–8.1)

## 2023-01-09 LAB — CBC
HCT: 39.9 % (ref 36.0–46.0)
Hemoglobin: 12.7 g/dL (ref 12.0–15.0)
MCH: 25.6 pg — ABNORMAL LOW (ref 26.0–34.0)
MCHC: 31.8 g/dL (ref 30.0–36.0)
MCV: 80.3 fL (ref 80.0–100.0)
Platelets: 115 10*3/uL — ABNORMAL LOW (ref 150–400)
RBC: 4.97 MIL/uL (ref 3.87–5.11)
RDW: 17.2 % — ABNORMAL HIGH (ref 11.5–15.5)
WBC: 4.3 10*3/uL (ref 4.0–10.5)
nRBC: 0 % (ref 0.0–0.2)

## 2023-01-09 LAB — IRON AND TIBC
Iron: 44 ug/dL (ref 28–170)
Saturation Ratios: 24 % (ref 10.4–31.8)
TIBC: 186 ug/dL — ABNORMAL LOW (ref 250–450)
UIBC: 142 ug/dL

## 2023-01-09 LAB — GLUCOSE, CAPILLARY
Glucose-Capillary: 125 mg/dL — ABNORMAL HIGH (ref 70–99)
Glucose-Capillary: 126 mg/dL — ABNORMAL HIGH (ref 70–99)
Glucose-Capillary: 135 mg/dL — ABNORMAL HIGH (ref 70–99)
Glucose-Capillary: 137 mg/dL — ABNORMAL HIGH (ref 70–99)
Glucose-Capillary: 140 mg/dL — ABNORMAL HIGH (ref 70–99)
Glucose-Capillary: 156 mg/dL — ABNORMAL HIGH (ref 70–99)

## 2023-01-09 LAB — VITAMIN B12: Vitamin B-12: 709 pg/mL (ref 180–914)

## 2023-01-09 LAB — FERRITIN: Ferritin: 240 ng/mL (ref 11–307)

## 2023-01-09 LAB — RETICULOCYTES
Immature Retic Fract: 8.4 % (ref 2.3–15.9)
RBC.: 4.95 MIL/uL (ref 3.87–5.11)
Retic Count, Absolute: 33.7 10*3/uL (ref 19.0–186.0)
Retic Ct Pct: 0.7 % (ref 0.4–3.1)

## 2023-01-09 LAB — FOLATE: Folate: 8.7 ng/mL (ref >5.9)

## 2023-01-09 LAB — HEPARIN LEVEL (UNFRACTIONATED): Heparin Unfractionated: 0.21 IU/mL — ABNORMAL LOW (ref 0.30–0.70)

## 2023-01-09 LAB — PHOSPHORUS: Phosphorus: 2.4 mg/dL — ABNORMAL LOW (ref 2.5–4.6)

## 2023-01-09 LAB — MAGNESIUM: Magnesium: 1.6 mg/dL — ABNORMAL LOW (ref 1.7–2.4)

## 2023-01-09 MED ORDER — LOSARTAN POTASSIUM 25 MG PO TABS
25.0000 mg | ORAL_TABLET | Freq: Every day | ORAL | Status: DC
  Filled 2023-01-09: qty 1

## 2023-01-09 MED ORDER — CARVEDILOL 6.25 MG PO TABS
6.2500 mg | ORAL_TABLET | Freq: Two times a day (BID) | ORAL | Status: DC
  Administered 2023-01-09 – 2023-01-11 (×3): 6.25 mg via ORAL
  Filled 2023-01-09 (×3): qty 1

## 2023-01-09 MED ORDER — MAGNESIUM SULFATE 2 GM/50ML IV SOLN
2.0000 g | Freq: Once | INTRAVENOUS | Status: AC
  Administered 2023-01-09: 2 g via INTRAVENOUS
  Filled 2023-01-09: qty 50

## 2023-01-09 MED ORDER — APIXABAN 5 MG PO TABS
5.0000 mg | ORAL_TABLET | Freq: Two times a day (BID) | ORAL | Status: DC
  Administered 2023-01-09 – 2023-01-11 (×5): 5 mg via ORAL
  Filled 2023-01-09 (×5): qty 1

## 2023-01-09 MED ORDER — HEPARIN BOLUS VIA INFUSION
1200.0000 [IU] | Freq: Once | INTRAVENOUS | Status: AC
  Administered 2023-01-09: 1200 [IU] via INTRAVENOUS
  Filled 2023-01-09: qty 1200

## 2023-01-09 MED ORDER — GLUCERNA SHAKE PO LIQD
237.0000 mL | Freq: Three times a day (TID) | ORAL | Status: DC
  Administered 2023-01-09 – 2023-01-11 (×6): 237 mL via ORAL

## 2023-01-09 NOTE — Evaluation (Signed)
Physical Therapy Evaluation Patient Details Name: [name] MRN: [id number] DOB: 1966/11/15 Today's Date: 01/09/2023  History of Present Illness  Pt is a 57 y.o. female presenting to hospital 01/06/23 with c/o difficulty eating; lost over 25 pounds in last 45 days.  Pt admitted with intractable vomiting, dysphagia, lactic acidosis, UTI, AKI, unintentional weight loss, hypercalcemia, hypertensive urgency, and elevated troponin (likely d/t demand ischemia per notes), and new onset a-fib.  S/p upper GI endoscopy 2/6 and s/p colonoscopy 2/7.  PMH includes CHF, DM, htn.  Clinical Impression  Prior to hospital admission, pt was independent with functional mobility; lives with her adult son on main level of duplex with 3-4 STE (no railing).  Currently pt is SBA with transfers and CGA progressing to SBA ambulating 190 feet with RW use (pt requiring 4 short standing rest breaks d/t nausea; limited distance ambulating d/t nausea).  No c/o pain.  Pt demonstrating generalized weakness and decreased activity tolerance compared to baseline.  Pt would benefit from skilled PT to address noted impairments and functional limitations (see below for any additional details).  Upon hospital discharge, pt would benefit from OP PT.    Recommendations for follow up therapy are one component of a multi-disciplinary discharge planning process, led by the attending physician.  Recommendations may be updated based on patient status, additional functional criteria and insurance authorization.  Follow Up Recommendations Outpatient PT      Assistance Recommended at Discharge Set up Supervision/Assistance  Patient can return home with the following  A little help with walking and/or transfers;A little help with bathing/dressing/bathroom;Assistance with cooking/housework;Assist for transportation;Help with stairs or ramp for entrance    Equipment Recommendations Rolling walker (2 wheels);BSC/3in1  Recommendations for  Other Services       Functional Status Assessment Patient has had a recent decline in their functional status and demonstrates the ability to make significant improvements in function in a reasonable and predictable amount of time.     Precautions / Restrictions Precautions Precautions: Fall Restrictions Weight Bearing Restrictions: No      Mobility  Bed Mobility               General bed mobility comments: Deferred (pt in recliner beginning/end of session)    Transfers Overall transfer level: Needs assistance Equipment used: Rolling walker (2 wheels) Transfers: Sit to/from Stand Sit to Stand: Supervision           General transfer comment: mild increased effort to stand from recliner but steady with RW use    Ambulation/Gait Ambulation/Gait assistance: Min guard, Supervision Gait Distance (Feet): 190 Feet Assistive device: Rolling walker (2 wheels)   Gait velocity: decreased     General Gait Details: partial step through gait pattern; 4 short standing rest breaks d/t nausea; steady with RW use  Stairs            Wheelchair Mobility    Modified Rankin (Stroke Patients Only)       Balance Overall balance assessment: Needs assistance Sitting-balance support: No upper extremity supported, Feet supported Sitting balance-Leahy Scale: Normal Sitting balance - Comments: steady sitting reaching outside BOS   Standing balance support: Bilateral upper extremity supported, During functional activity, Reliant on assistive device for balance Standing balance-Leahy Scale: Good Standing balance comment: steady ambulating with RW use                             Pertinent Vitals/Pain Pain Assessment Pain Assessment: No/denies  pain Vitals (HR and O2 on room air) stable and WFL throughout treatment session.    Home Living Family/patient expects to be discharged to:: Private residence Living Arrangements: Children (pt's adult son) Available  Help at Discharge: Family Type of Home: Other(Comment) (Duplex; live on main level) Home Access: Stairs to enter Entrance Stairs-Rails: None Entrance Stairs-Number of Steps: 3-4   Home Layout: One level Home Equipment: None      Prior Function Prior Level of Function : Independent/Modified Independent             Mobility Comments: Independent with ambulation; no recent falls (pt reports almost falling recently but her son caught her)       Hand Dominance        Extremity/Trunk Assessment   Upper Extremity Assessment Upper Extremity Assessment: Defer to OT evaluation;Overall [facility] for tasks assessed    Lower Extremity Assessment Lower Extremity Assessment: Generalized weakness    Cervical / Trunk Assessment Cervical / Trunk Assessment: Normal  Communication   Communication: No difficulties  Cognition Arousal/Alertness: Awake/alert Behavior During Therapy: WFL for tasks assessed/performed Overall Cognitive Status: Within Functional Limits for tasks assessed                                          General Comments  Nursing cleared pt for participation in physical therapy.  Pt agreeable to PT session.    Exercises  Gait training with walker.   Assessment/Plan    PT Assessment Patient needs continued PT services  PT Problem List Decreased strength;Decreased activity tolerance;Decreased balance;Decreased mobility;Decreased knowledge of use of DME;Decreased knowledge of precautions       PT Treatment Interventions DME instruction;Gait training;Stair training;Functional mobility training;Therapeutic activities;Therapeutic exercise;Balance training;Patient/family education    PT Goals (Current goals can be found in the Care Plan section)  Acute Rehab PT Goals Patient Stated Goal: to improve strength and mobility PT Goal Formulation: With patient Time For Goal Achievement: 01/23/23 Potential to Achieve Goals: Good    Frequency Min 2X/week      Co-evaluation               AM-PAC PT "6 Clicks" Mobility  Outcome Measure Help needed turning from your back to your side while in a flat bed without using bedrails?: None Help needed moving from lying on your back to sitting on the side of a flat bed without using bedrails?: None Help needed moving to and from a bed to a chair (including a wheelchair)?: A Little Help needed standing up from a chair using your arms (e.g., wheelchair or bedside chair)?: A Little Help needed to walk in hospital room?: A Little Help needed climbing 3-5 steps with a railing? : A Little 6 Click Score: 20    End of Session Equipment Utilized During Treatment: Gait belt Activity Tolerance: Patient limited by fatigue;Other (comment) (limited d/t nausea) Patient left: in chair;with call bell/phone within reach;with chair alarm set (NT present) Nurse Communication: Mobility status;Precautions PT Visit Diagnosis: Other abnormalities of gait and mobility (R26.89);Muscle weakness (generalized) (M62.81)    Time: 9417-4081 PT Time Calculation (min) (ACUTE ONLY): 25 min   Charges:   PT Evaluation $PT Eval Low Complexity: 1 Low PT Treatments $Therapeutic Activity: 8-22 mins       Leitha Bleak, PT 01/09/23, 11:56 AM

## 2023-01-09 NOTE — Progress Notes (Signed)
Rounding Note    Patient Name: [name] Date of Encounter: 01/09/2023  [facility] Cardiologist: [facility]  Subjective   Sitting up in recliner chair, eating lunch, reports that she has no significant GI problems, denies nausea or vomiting Reports she has had GI issues for 2 months with chronic anorexia, nausea vomiting Has completed EGD colonoscopy this admission Gastritis noted diverticulosis  Diagnosed with atrial fibrillation, cardiomyopathy this admission EF 35 to 40%, significant decline from prior study December 2020 EF 55 to 60%  Inpatient Medications    Scheduled Meds:  (feeding supplement) PROSource Plus  30 mL Oral TID BM   apixaban  5 mg Oral BID   carvedilol  6.25 mg Oral BID WC   feeding supplement  1 Container Oral TID BM   insulin aspart  0-15 Units Subcutaneous Q4H   [START ON 01/10/2023] losartan  25 mg Oral Daily   multivitamin with minerals  1 tablet Oral Daily   pantoprazole (PROTONIX) IV  40 mg Intravenous Q12H   spironolactone  12.5 mg Oral Daily   Continuous Infusions:  sodium chloride 50 mL/hr at 01/09/23 0345   PRN Meds: acetaminophen **OR** acetaminophen, hydrALAZINE, morphine injection, ondansetron **OR** ondansetron (ZOFRAN) IV, polyethylene glycol-electrolytes   Vital Signs    Vitals:   01/09/23 0345 01/09/23 0813 01/09/23 0841 01/09/23 1146  BP: 131/80 (!) 154/88  (!) 148/88  Pulse: (!) 56 (!) 57 64 62  Resp: 18 17  16   Temp: 98.4 F (36.9 C) 97.7 F (36.5 C)    TempSrc:  Oral    SpO2: 97% 99%  100%  Weight:      Height:        Intake/Output Summary (Last 24 hours) at 01/09/2023 1455 Last data filed at 01/09/2023 0700 Gross per 24 hour  Intake 355.18 ml  Output 400 ml  Net -44.82 ml      01/06/2023   12:36 AM 02/01/2020   12:33 PM 12/28/2019   12:37 PM  Last 3 Weights  Weight (lbs) 208 lb 4.8 oz 262 lb 9.6 oz 264 lb  Weight (kg) 94.484 kg 119.115 kg 119.75 kg      Telemetry    Atrial fibrillation-  Personally Reviewed  ECG     - Personally Reviewed  Physical Exam   GEN: No acute distress.  Obese Neck: No JVD Cardiac: Irregularly irregular, no murmurs, rubs, or gallops.  Respiratory: Clear to auscultation bilaterally. GI: Soft, nontender, non-distended  MS: No edema; No deformity. Neuro:  Nonfocal  Psych: Normal affect   Labs    High Sensitivity Troponin:   Recent Labs  Lab 01/06/23 0034 01/06/23 0333  TROPONINIHS 93* 84*     Chemistry Recent Labs  Lab 01/06/23 0034 01/06/23 0035 01/06/23 1606 01/07/23 0556 01/08/23 0643 01/09/23 0100  NA  --  137   < > 135 134* 135  K  --  4.4   < > 4.3 3.6 3.6  CL  --  95*   < > 98 101 103  CO2  --  24   < > 25 23 24   GLUCOSE  --  184*   < > 82 104* 129*  BUN  --  23*   < > 19 20 17   CREATININE  --  1.21*   < > 0.97 0.96 0.88  CALCIUM  --  11.2*   < > 9.7 9.4 9.1  MG 2.4  --   --   --   --  1.6*  PROT  --  8.3*  --   --   --  5.9*  ALBUMIN  --  3.4*  --   --   --  2.6*  AST  --  26  --   --   --  14*  ALT  --  16  --   --   --  10  ALKPHOS  --  53  --   --   --  36*  BILITOT  --  1.5*  --   --   --  1.0  GFRNONAA  --  52*   < > >60 >60 >60  ANIONGAP  --  18*   < > 12 10 8    < > = values in this interval not displayed.    Lipids No results for input(s): "CHOL", "TRIG", "HDL", "LABVLDL", "LDLCALC", "CHOLHDL" in the last 168 hours.  Hematology Recent Labs  Lab 01/07/23 0428 01/08/23 0643 01/09/23 0100  WBC 5.6 3.0* 4.3  RBC 5.74* 5.57* 4.97  4.95  HGB 14.9 14.2 12.7  HCT 46.1* 44.3 39.9  MCV 80.3 79.5* 80.3  MCH 26.0 25.5* 25.6*  MCHC 32.3 32.1 31.8  RDW 18.1* 17.4* 17.2*  PLT 141* 139* 115*   Thyroid  Recent Labs  Lab 01/06/23 0034  TSH 0.689    BNP Recent Labs  Lab 01/06/23 0034  BNP 553.3*    DDimer No results for input(s): "DDIMER" in the last 168 hours.   Radiology    MR LIVER W WO CONTRAST  Result Date: 01/08/2023 CLINICAL DATA:  Liver lesions on CT scan for further characterization.  EXAM: MRI ABDOMEN WITHOUT AND WITH CONTRAST TECHNIQUE: Multiplanar multisequence MR imaging of the abdomen was performed both before and after the administration of intravenous contrast. CONTRAST:  72mL GADAVIST GADOBUTROL 1 MMOL/ML IV SOLN COMPARISON:  01/07/2023 FINDINGS: Despite efforts by the technologist and patient, motion artifact is present on today's exam and could not be eliminated. This reduces exam sensitivity and specificity. Lower chest: Mild cardiomegaly. Suspected atelectasis in the left lower lobe. Hepatobiliary: The more caudad lesion in segment 4 of the liver along the falciform ligament represents focal steatosis, as shown on image 43 of series 9 where there is dropout of signal. The more cephalad segment 4 lesion measures 2.4 by 1.6 by 2.4 cm and demonstrates accentuated sharply defined T2 signal as well as peripheral nodular centripetal enhancement and delayed enhancement compatible with a benign hepatic hemangioma. No further workup of this lesion is required. A 5 mm T2 hyperintense lesion in the lateral segment left hepatic lobe on image 18 series 7 demonstrates T1 hypointensity and subsequent fill in with contrast enhancement strongly favoring a small benign hepatic hemangioma. This lesion is not felt to require further workup at this time. The gallbladder appears unremarkable. Sludge versus tiny layering gallstones in the gallbladder, for example on image 22 series 4. Pancreas:  Unremarkable Spleen:  Unremarkable Adrenals/Urinary Tract: 1.3 cm Bosniak category 1 cyst of the left kidney lower pole on image 59 of series 21, benign and warranting no further workup. Adrenal glands appear normal. The kidneys appear otherwise unremarkable. Stomach/Bowel: Unremarkable Vascular/Lymphatic:  Unremarkable Other:  No supplemental non-categorized findings. Musculoskeletal: Lower lumbar loss of disc height and degenerative endplate findings associated spondylosis and degenerative disc disease. On coronal  images were observe a left paracentral umbilical hernia containing adipose tissues. IMPRESSION: 1. Both previous lesions of concern in the lateral segment left hepatic lobe are benign, 1 of these is a benign hemangioma  and the other represents focal steatosis. There is also a tiny third lesion in the lateral segment left hepatic lobe most compatible with a small hemangioma as well. These liver lesions do not warrant further workup. 2. Sludge versus tiny layering gallstones in the gallbladder. 3. Mild cardiomegaly. 4. Lower lumbar spondylosis and degenerative disc disease. 5. 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 Electronically Signed   By: Van Clines M.D.   On: 01/08/2023 19:59   CT CHEST ABDOMEN PELVIS W CONTRAST  Result Date: 01/07/2023 CLINICAL DATA:  Metastatic disease evaluation. Unintentional weight loss with nausea. Patient is status post benign endoscopy. Status post colonoscopy today for which results are not available. EXAM: CT CHEST, ABDOMEN, AND PELVIS WITH CONTRAST TECHNIQUE: Multidetector CT imaging of the chest, abdomen and pelvis was performed following the standard protocol during bolus administration of intravenous contrast. RADIATION DOSE REDUCTION: This exam was performed according to the departmental dose-optimization program which includes automated exposure control, adjustment of the mA and/or kV according to patient size and/or use of iterative reconstruction technique. CONTRAST:  110mL OMNIPAQUE IOHEXOL 300 MG/ML  SOLN COMPARISON:  None Available. FINDINGS: CT CHEST FINDINGS Cardiovascular: Cardiomegaly. No central pulmonary embolism. Thoracic aorta is normal in course and caliber. Mild enlargement of the main pulmonary artery in relation to the thoracic aorta. No pericardial effusion. Three-vessel coronary artery atherosclerotic calcifications. Aortic valve calcifications. Mediastinum/Nodes: Visualized thyroid is unremarkable. No axillary or mediastinal  adenopathy. Lungs/Pleura: No pleural effusion or pneumothorax. Small amount of debris within the trachea. Mild centrilobular ground-glass opacities at the lung bases, likely the sequela of aspiration. No suspicious pulmonary masses. 2 mm LEFT lower lobe pulmonary nodule, likely infectious or inflammatory (series 4, image 105). Musculoskeletal: No acute osseous abnormality. CT ABDOMEN PELVIS FINDINGS Hepatobiliary: Hepatic steatosis. In the hepatic segment 4, there is a hypodense mass which measures approximately 23 mm (series 2, image 52). Additional 17 mm hypodensity adjacent to the falciform ligament may reflect focal fatty deposition but is somewhat expansile in appearance (series 2, image 59). Gallbladder is unremarkable. No intrahepatic or extrahepatic biliary ductal dilation. Pancreas: Unremarkable. No pancreatic ductal dilatation or surrounding inflammatory changes. Spleen: Normal in size without focal abnormality. Adrenals/Urinary Tract: Adrenal glands are unremarkable. Kidneys enhance symmetrically. No hydronephrosis. Subcentimeter hypodense lesions are too small to accurately characterize, but statistically likely cysts. Benign cyst of the inferior pole of the LEFT kidney (for which no dedicated imaging follow-up is recommended). Bladder is decompressed. Stomach/Bowel: No evidence of bowel obstruction. Scattered diverticulosis without evidence of acute diverticulitis. Appendix is normal. Stomach is unremarkable. Vascular/Lymphatic: Mild atherosclerotic calcifications of the nonaneurysmal abdominal aorta. No suspicious lymphadenopathy. Reproductive: Fibroid uterus. Endometrium is mildly prominent in a postmenopausal setting measuring an estimated 9 mm. Symmetric adnexa. Other: Small fat containing periumbilical hernia. No free air or free fluid. Musculoskeletal: Degenerative changes of the lumbar spine. IMPRESSION: 1. There are 2 indeterminate hypodense hepatic lesions. Recommend further evaluation with  dedicated liver MRI with and without contrast. 2. Fibroid uterus. Endometrium is mildly prominent in a postmenopausal setting measuring an estimated 9 mm. Recommend correlation with pelvic ultrasound. 3. Mild enlargement of the main pulmonary artery in relation to the thoracic aorta. This can be seen in the setting of pulmonary arterial hypertension. 4. Hepatic steatosis. 5. Mild centrilobular ground-glass opacities at the lung bases, likely the sequela of aspiration. 6. 2 mm LEFT lower lobe pulmonary nodule, likely infectious or inflammatory. No follow-up needed if patient is low-risk.This recommendation follows the consensus statement: Guidelines for Management of Incidental  Pulmonary Nodules Detected on CT Images: From the Fleischner Society 2017; Radiology 2017; [phone]. Aortic Atherosclerosis (ICD10-I70.0). Electronically Signed   By: Meda Klinefelter M.D.   On: 01/07/2023 18:37    Cardiac Studies   Echo Left ventricular ejection fraction, by estimation, is 35 to 40%. The  left ventricle has moderately decreased function. The left ventricle  demonstrates global hypokinesis. The left ventricular internal cavity size  was moderately dilated. There is mild   left ventricular hypertrophy. Left ventricular diastolic parameters are  indeterminate.   2. Right ventricular systolic function is normal. The right ventricular  size is normal. Tricuspid regurgitation signal is inadequate for assessing  PA pressure.   3. Left atrial size was moderately dilated.   4. Right atrial size was mildly dilated.   5. The mitral valve is normal in structure. Mild to moderate mitral valve  regurgitation. No evidence of mitral stenosis.   6. The aortic valve is normal in structure. Aortic valve regurgitation is  mild to moderate. Aortic valve sclerosis/calcification is present, without  any evidence of aortic stenosis.   7. Pulmonic valve regurgitation is moderate.   8. Aortic dilatation noted. There is mild  dilatation of the aortic root,  measuring 39 mm.   9. The inferior vena cava is normal in size with greater than 50%  respiratory variability, suggesting right atrial pressure of 3 mmHg.   Patient Profile      57 year old female with known history of essential hypertension, type 2 diabetes and chronic diastolic heart failure who presented with recurrent nausea, vomiting, inability to keep anything down and weight loss over the last 1 to 2 months.  We are consulted for cardiomyopathy and atrial fibrillation.  Assessment & Plan    Persistent atrial fibrillation Plan for anticoagulation 3 to 4 weeks, with consideration of cardioversion at that time Currently not requiring rate controlling agents A-fib likely contributing to cardiomyopathy as detailed below  Dilated cardiomyopathy Global hypokinesis, possibly exacerbated by atrial fibrillation though unable to exclude hypertensive heart disease Consider outpatient ischemic workup Coreg dosing increased up to 6.25 twice daily, losartan up to 25 daily, spironolactone As outpatient consider adding Entresto, Jardiance  Intractable nausea, vomiting, dysphagia EGD colonoscopy, gastritis noted Reports that she is eating today, no symptoms On PPI   Total encounter time more than 50 minutes  Greater than 50% was spent in counseling and coordination of care with the patient   For questions or updates, please contact [facility] Please consult [email or web address] for contact info under        Signed, Julien Nordmann, MD  01/09/2023, 2:55 PM

## 2023-01-09 NOTE — Evaluation (Signed)
Occupational Therapy Evaluation Patient Details Name: [name] MRN: [id number] DOB: 12/01/1966 Today's Date: 01/09/2023   History of Present Illness Pt is a 57 y.o. female presenting to hospital 01/06/23 with c/o difficulty eating; lost over 25 pounds in last 45 days.  Pt admitted with intractable vomiting, dysphagia, lactic acidosis, UTI, AKI, unintentional weight loss, hypercalcemia, hypertensive urgency, and elevated troponin (likely d/t demand ischemia per notes), and new onset a-fib.  S/p upper GI endoscopy 2/6 and s/p colonoscopy 2/7.  PMH includes CHF, DM, htn.   Clinical Impression   Patient presenting with decreased Ind in self care,balance, functional mobility/transfers, endurance, and safety awareness. Patient reports living at home with family and being Ind with self care and mobility without use of AD. Patient currently functioning at supervision - min guard overall with RW. Pt ambulates 6' to recliner chair with RW and breakfast tray set up for meal. Pt does not report nausea or dizziness during session.  Patient will benefit from acute OT to increase overall independence in the areas of ADLs, functional mobility, and safety awareness in order to safely discharge home with family.      Recommendations for follow up therapy are one component of a multi-disciplinary discharge planning process, led by the attending physician.  Recommendations may be updated based on patient status, additional functional criteria and insurance authorization.   Follow Up Recommendations  Home health OT     Assistance Recommended at Discharge Intermittent Supervision/Assistance  Patient can return home with the following A little help with walking and/or transfers;A little help with bathing/dressing/bathroom;Help with stairs or ramp for entrance;Assist for transportation;Assistance with cooking/housework    Functional Status Assessment  Patient has had a recent decline in their functional  status and demonstrates the ability to make significant improvements in function in a reasonable and predictable amount of time.  Equipment Recommendations  Other (comment) (RW)       Precautions / Restrictions Precautions Precautions: Fall Restrictions Weight Bearing Restrictions: No      Mobility Bed Mobility Overal bed mobility: Needs Assistance Bed Mobility: Supine to Sit, Sit to Supine     Supine to sit: Supervision Sit to supine: Supervision   General bed mobility comments: cuing for technique but no physical assistance provided    Transfers Overall transfer level: Needs assistance Equipment used: Rolling walker (2 wheels) Transfers: Sit to/from Stand, Bed to chair/wheelchair/BSC Sit to Stand: Supervision     Step pivot transfers: Supervision            Balance Overall balance assessment: Needs assistance Sitting-balance support: No upper extremity supported, Feet supported Sitting balance-Leahy Scale: Normal     Standing balance support: Bilateral upper extremity supported, During functional activity, Reliant on assistive device for balance Standing balance-Leahy Scale: Good                             ADL either performed or assessed with clinical judgement   ADL Overall ADL's : Needs assistance/impaired                         Toilet Transfer: Copy Details (indicate cue type and reason): simulated transfer         Functional mobility during ADLs: Supervision/safety;Rolling walker (2 wheels)       Vision Patient Visual Report: No change from baseline              Pertinent Vitals/Pain  Pain Assessment Pain Assessment: No/denies pain     Hand Dominance Right   Extremity/Trunk Assessment Upper Extremity Assessment Upper Extremity Assessment: Generalized weakness   Lower Extremity Assessment Lower Extremity Assessment: Generalized weakness   Cervical / Trunk Assessment Cervical /  Trunk Assessment: Normal   Communication Communication Communication: No difficulties   Cognition Arousal/Alertness: Awake/alert Behavior During Therapy: WFL for tasks assessed/performed Overall Cognitive Status: Within Functional Limits for tasks assessed                                                  Home Living Family/patient expects to be discharged to:: Private residence Living Arrangements: Children;Non-relatives/Friends Available Help at Discharge: Family Type of Home: Other(Comment) (duplex and they live on bottom level) Home Access: Stairs to enter Technical brewer of Steps: 3-4 Entrance Stairs-Rails: None Home Layout: One level     Bathroom Shower/Tub: Tub/shower unit;Sponge bathes at baseline   [employer]: Standard     Home Equipment: None          Prior Functioning/Environment Prior Level of Function : Independent/Modified Independent             Mobility Comments: Independent with ambulation; no recent falls (pt reports almost falling recently but her son caught her) ADLs Comments: Ind with aspects of self care        OT Problem List: Decreased strength;Decreased activity tolerance;Decreased safety awareness;Impaired balance (sitting and/or standing);Decreased knowledge of use of DME or AE      OT Treatment/Interventions: Self-care/ADL training;Therapeutic exercise;Therapeutic activities;DME and/or AE instruction;Manual therapy;Balance training;Energy conservation;Patient/family education    OT Goals(Current goals can be found in the care plan section) Acute Rehab OT Goals Patient Stated Goal: to get stronger and go home OT Goal Formulation: With patient Time For Goal Achievement: 01/23/23 Potential to Achieve Goals: Good ADL Goals Pt Will Perform Grooming: with modified independence;standing Pt Will Transfer to Toilet: with modified independence;ambulating Pt Will Perform Toileting - Clothing Manipulation and  hygiene: with modified independence;sit to/from stand Pt Will Perform Tub/Shower Transfer: with modified independence;rolling walker  OT Frequency: Min 2X/week       AM-PAC OT "6 Clicks" Daily Activity     Outcome Measure Help from another person eating meals?: None Help from another person taking care of personal grooming?: None Help from another person toileting, which includes using toliet, bedpan, or urinal?: A Little Help from another person bathing (including washing, rinsing, drying)?: A Little Help from another person to put on and taking off regular upper body clothing?: None Help from another person to put on and taking off regular lower body clothing?: A Little 6 Click Score: 21   End of Session Equipment Utilized During Treatment: Rolling walker (2 wheels) Nurse Communication: Mobility status;Other (comment) (need green box for chair alarm under pt)  Activity Tolerance: Patient tolerated treatment well Patient left: in chair;with call bell/phone within reach  OT Visit Diagnosis: Muscle weakness (generalized) (M62.81);Unsteadiness on feet (R26.81)                Time: 9562-1308 OT Time Calculation (min): 14 min Charges:  OT General Charges $OT Visit: 1 Visit OT Evaluation $OT Eval Low Complexity: 1 Low OT Treatments $Therapeutic Activity: 8-22 mins  Darleen Crocker, MS, OTR/L , CBIS ascom [phone]  01/09/23, 12:59 PM

## 2023-01-09 NOTE — Plan of Care (Signed)

## 2023-01-09 NOTE — Consult Note (Signed)
[place] for transition  Heparin gtt to Eliquis Indication:  New Onset Atrial Fibrillation  Patient Measurements: Height: 5\' 6"  (167.6 cm) Weight: 94.5 kg (208 lb 4.8 oz) IBW/kg (Calculated) : 59.3 Heparin Dosing Weight: 80.2 kg  Labs: Recent Labs    01/06/23 1606 01/06/23 1606 01/06/23 2317 01/07/23 0428 01/07/23 0556 01/07/23 1935 01/08/23 0643 01/09/23 0100  HGB  --    < >  --  14.9  --   --  14.2 12.7  HCT  --   --   --  46.1*  --   --  44.3 39.9  PLT  --   --   --  141*  --   --  139* 115*  APTT 30  --   --   --   --   --   --   --   LABPROT 15.1  --   --   --   --   --   --   --   INR 1.2  --   --   --   --   --   --   --   HEPARINUNFRC <0.10*  --  1.04*  --   --  0.26*  --  0.21*  CREATININE 1.11*  --   --   --  0.97  --  0.96 0.88   < > = values in this interval not displayed.    Estimated Creatinine Clearance: 81.7 mL/min (by C-G formula based on SCr of 0.88 mg/dL).  Medical History: Past Medical History:  Diagnosis Date   CHF (congestive heart failure) ([place])    Diabetes mellitus without complication ([place])    Hypertension     Assessment: 57 year old female who has a past medical history of hypertension, diabetes, and congestive heart failure presenting to the ED with nausea and vomiting. Upon further assessment and EKG results, was found to have new onset atrial fibrillation. Pharmacy has been consulted to initiation and titrate heparin infusion. Baseline labs have been ordered and are pending.  Goal of Therapy:  Heparin level 0.3-0.7 units/ml Monitor platelets by anticoagulation protocol: Yes   2/5 2317 HL 1.04, supratherapeutic, 1200 un/hr 2/6 1200 - resume heparin gtt now and stop at mid-night per MD orders (colonoscopy tomorrow 2/7) 2/6 1935 HL 0.26, subtherapeutic; 1050 un/hr 2/7 Heparin drip held for colonoscopy. Restarted at [place] 2/08 0100 HL 0.21, subtherapeutic  Plan:  Stop heparin infusion, dc heparin  consult and dc heparin order for heparin level Start Eliquis 5mg  po BID (56yo, Scr 0.88, Wt 94.5kg)  Bobetta Korf Rodriguez-Guzman PharmD, BCPS 01/09/2023 7:58 AM

## 2023-01-09 NOTE — TOC Benefit Eligibility Note (Addendum)
Patient Teacher, English as a foreign language completed.    The patient is currently admitted and upon discharge could be taking Eliquis 5 mg.  The current 30 day co-pay is $4.00.   The patient is currently admitted and upon discharge could be taking Farxiga 10 mg.  Requires Prior Authorization  The patient is currently admitted and upon discharge could be taking Entresto 24-26 mg.  Requires Prior Authorization  The patient is insured through [employer], [place] Patient Advocate Specialist [place] Patient Advocate Team Direct Number: [phone]  Fax: [phone]

## 2023-01-09 NOTE — Progress Notes (Signed)
PROGRESS NOTE  [name] QPY:[id number] DOB: Dec 22, 1965   PCP: Marya Fossa, PA-C  Patient is from: Home  DOA: 01/06/2023 LOS: 3  Chief complaints No chief complaint on file.    Brief Narrative / Interim history: 57 year old F with PMH of DM-2, diastolic CHF, hypertension and obesity presenting with prandial emesis, unintentional weight loss and dysphagia for quite some time and admitted for intractable vomiting AKI, unintentional weight loss, UTI and hypertensive urgency.  CT chest, abdomen and pelvis showed 2 indeterminate hypodense hepatic lesions, fibroid uterus, mildly prominent endometrium in postmenopausal setting measuring about 9 mm, mild enlargement of main pulmonary artery, hepatic steatosis, mild central lobar groundglass opacities at lung bases. Patient also had new onset A-fib with RVR and acute systolic CHF.  Cardiology and GI consulted.  Started on IV ceftriaxone for possible UTI.  EGD showed gastritis.  Colonoscopy with stool in entire examined colon, nonbleeding internal hemorrhoid and diverticulosis in the rectosigmoid colon and sigmoid colon.  GI recommended advancing diet and repeat colonoscopy in 2 months outpatient.  MRI liver ordered to evaluate hepatic lesion and showed benign hemangioma and possible focal steatosis.  In regards to A-fib and CHF, cardiology following and adjusting cardiac meds day by day. Overall, emesis has resolved.  She is tolerating diet.  Subjective: Seen and examined earlier this morning.  No major events overnight of this morning.  No complaints other than feeling tired.  She denies chest pain, shortness of breath, GI or UTI symptoms.  Objective: Vitals:   01/09/23 0345 01/09/23 0813 01/09/23 0841 01/09/23 1146  BP: 131/80 (!) 154/88  (!) 148/88  Pulse: (!) 56 (!) 57 64 62  Resp: 18 17  16   Temp: 98.4 F (36.9 C) 97.7 F (36.5 C)    TempSrc:  Oral    SpO2: 97% 99%  100%  Weight:      Height:         Examination:  GENERAL: No apparent distress.  Nontoxic. HEENT: MMM.  Vision and hearing grossly intact.  NECK: Supple.  No apparent JVD.  RESP:  No IWOB.  Fair aeration bilaterally. CVS:  RRR. Heart sounds normal.  ABD/GI/GU: BS+. Abd soft, NTND.  MSK/EXT:   No apparent deformity. Moves extremities. No edema.  SKIN: no apparent skin lesion or wound NEURO: Awake and alert. Oriented appropriately.  No apparent focal neuro deficit. PSYCH: Calm. Normal affect.   Procedures:  2/6-EGD showed gastritis that was biopsied and LA grade a esophagitis without bleeding 2/7-colonoscopy showed stool in entire examined colon, nonbleeding internal hemorrhoid and diverticulosis in the rectosigmoid colon and sigmoid colon  Microbiology summarized: COVID-19, influenza and RSV PCR nonreactive. Urine culture with E. coli.  Assessment and plan: Principal Problem:   Intractable vomiting Active Problems:   Dysphagia   High anion gap metabolic acidosis   Lactic acidosis   Urinary tract infection   Hypercalcemia   Unintentional weight loss   AKI (acute kidney injury) (HCC)   Hypertensive urgency   Elevated troponin   Diabetes mellitus without complication (HCC)   Persistent atrial fibrillation (HCC)   Acute HFrEF (heart failure with reduced ejection fraction) (HCC)  Intractable vomiting/dysphagia/unintentional weight loss: Unclear etiology of this.  Gastroparesis?  EGD with gastritis and esophagitis.  Colonoscopy without significant finding.  Vomiting seems to have resolved. -GI recommended repeat colonoscopy in 2 months -Continue PPI and antiemetics. -Appreciate input by dietitian.  Acute systolic CHF: TTE with LVEF of 35 to 40% (previously 55%).  BNP elevated to 553.  Appears  euvolemic today.  Not on diuretics. -Cardiology following-on Coreg, losartan and Aldactone -Plan for [facility] once recovered.  New onset A-fib: Rate controlled.  The vascular score > 4. -On Coreg and  Eliquis. -Optimize electrolytes  Lactic acidosis/anion gap metabolic acidosis/starvation ketosis: Resolved. -Optimize nutrition after procedure   Urinary tract infection?  Urine culture with E. coli but patient denies UTI symptoms. -Received one from 2/5-2/8.   AKI: Resolved. Recent Labs    01/06/23 0035 01/06/23 1606 01/07/23 0556 01/08/23 0643 01/09/23 0100  BUN 23* 22* 19 20 17   CREATININE 1.21* 1.11* 0.97 0.96 0.88  -Continue monitoring  Uncontrolled NIDDM-2: A1c 8.2%.  CBG within acceptable range but NPO.  Seems to be on metformin at home. Recent Labs  Lab 01/08/23 2038 01/09/23 0009 01/09/23 0347 01/09/23 0813 01/09/23 1146  GLUCAP 158* 137* 156* 126* 135*  -Continue current insulin regimen   Hypercalcemia: Likely secondary.  Resolved.   Hypertensive urgency: BP 189/122 on arrival.  Resolved. -Cardiac meds as above   Elevated troponin: Likely demand ischemia. -Defer to cardiology  Physical deconditioning: -PT/OT eval.    Obesity/unintentional weight loss: Likely due to intractable vomiting and dysphagia. Wt Readings from Last 10 Encounters:  01/06/23 94.5 kg  02/01/20 119.1 kg  12/28/19 119.7 kg  12/15/19 120.2 kg  12/15/19 120.2 kg  12/02/19 119.9 kg  11/15/19 119.3 kg  03/01/17 127 kg  07/20/16 113.4 kg  06/30/16 104.3 kg  Body mass index is 33.62 kg/m. Nutrition Problem: Inadequate oral intake Etiology: altered GI function Signs/Symptoms: NPO status Interventions: Boost Breeze, MVI, Prostat   DVT prophylaxis:  On anticoagulation for A-fib. apixaban (ELIQUIS) tablet 5 mg  Code Status: Full code Family Communication: None at bedside Level of care: Telemetry Medical Status is: Inpatient Remains inpatient appropriate because: New onset A-fib and acute systolic CHF   Final disposition: Likely home once cleared. Consultants:  Cardiology Gastroenterology  35 minutes with more than 50% spent in reviewing records, counseling patient/family  and coordinating care.   Sch Meds:  Scheduled Meds:  (feeding supplement) PROSource Plus  30 mL Oral TID BM   apixaban  5 mg Oral BID   carvedilol  6.25 mg Oral BID WC   feeding supplement  1 Container Oral TID BM   insulin aspart  0-15 Units Subcutaneous Q4H   [START ON 01/10/2023] losartan  25 mg Oral Daily   multivitamin with minerals  1 tablet Oral Daily   pantoprazole (PROTONIX) IV  40 mg Intravenous Q12H   spironolactone  12.5 mg Oral Daily   Continuous Infusions:  sodium chloride 50 mL/hr at 01/09/23 0345   PRN Meds:.acetaminophen **OR** acetaminophen, hydrALAZINE, morphine injection, ondansetron **OR** ondansetron (ZOFRAN) IV, polyethylene glycol-electrolytes  Antimicrobials: Anti-infectives (From admission, onward)    Start     Dose/Rate Route Frequency Ordered Stop   01/07/23 0200  cefTRIAXone (ROCEPHIN) 2 g in sodium chloride 0.9 % 100 mL IVPB  Status:  Discontinued        2 g 200 mL/hr over 30 Minutes Intravenous Every 24 hours 01/06/23 0738 01/09/23 1404   01/06/23 0130  cefTRIAXone (ROCEPHIN) 1 g in sodium chloride 0.9 % 100 mL IVPB        1 g 200 mL/hr over 30 Minutes Intravenous STAT 01/06/23 0126 01/06/23 0300        I have personally reviewed the following labs and images: CBC: Recent Labs  Lab 01/06/23 0035 01/07/23 0428 01/08/23 0643 01/09/23 0100  WBC 4.2 5.6 3.0* 4.3  NEUTROABS 2.7  --   --   --  HGB 16.7* 14.9 14.2 12.7  HCT 52.9* 46.1* 44.3 39.9  MCV 79.7* 80.3 79.5* 80.3  PLT 196 141* 139* 115*   BMP &GFR Recent Labs  Lab 01/06/23 0034 01/06/23 0035 01/06/23 1606 01/07/23 0556 01/08/23 0643 01/09/23 0100 01/09/23 0101  NA  --  137 132* 135 134* 135  --   K  --  4.4 5.0 4.3 3.6 3.6  --   CL  --  95* 96* 98 101 103  --   CO2  --  24 24 25 23 24   --   GLUCOSE  --  184* 163* 82 104* 129*  --   BUN  --  23* 22* 19 20 17   --   CREATININE  --  1.21* 1.11* 0.97 0.96 0.88  --   CALCIUM  --  11.2* 9.7 9.7 9.4 9.1  --   MG 2.4  --   --    --   --  1.6*  --   PHOS  --   --   --   --   --   --  2.4*   Estimated Creatinine Clearance: 81.7 mL/min (by C-G formula based on SCr of 0.88 mg/dL). Liver & Pancreas: Recent Labs  Lab 01/06/23 0035 01/09/23 0100  AST 26 14*  ALT 16 10  ALKPHOS 53 36*  BILITOT 1.5* 1.0  PROT 8.3* 5.9*  ALBUMIN 3.4* 2.6*   No results for input(s): "LIPASE", "AMYLASE" in the last 168 hours. No results for input(s): "AMMONIA" in the last 168 hours. Diabetic: No results for input(s): "HGBA1C" in the last 72 hours.  Recent Labs  Lab 01/08/23 2038 01/09/23 0009 01/09/23 0347 01/09/23 0813 01/09/23 1146  GLUCAP 158* 137* 156* 126* 135*   Cardiac Enzymes: Recent Labs  Lab 01/06/23 0035  CKTOTAL 22*   No results for input(s): "PROBNP" in the last 8760 hours. Coagulation Profile: Recent Labs  Lab 01/06/23 1606  INR 1.2   Thyroid Function Tests: No results for input(s): "TSH", "T4TOTAL", "FREET4", "T3FREE", "THYROIDAB" in the last 72 hours.  Lipid Profile: No results for input(s): "CHOL", "HDL", "LDLCALC", "TRIG", "CHOLHDL", "LDLDIRECT" in the last 72 hours. Anemia Panel: Recent Labs    01/09/23 0100  VITAMINB12 709  FOLATE 8.7  FERRITIN 240  TIBC 186*  IRON 44  RETICCTPCT 0.7   Urine analysis:    Component Value Date/Time   COLORURINE YELLOW (A) 01/06/2023 0034   APPEARANCEUR TURBID (A) 01/06/2023 0034   APPEARANCEUR Cloudy 08/10/2013 1229   LABSPEC 1.019 01/06/2023 0034   LABSPEC 1.024 08/10/2013 1229   PHURINE 5.0 01/06/2023 0034   GLUCOSEU NEGATIVE 01/06/2023 0034   GLUCOSEU Negative 08/10/2013 1229   HGBUR SMALL (A) 01/06/2023 0034   BILIRUBINUR NEGATIVE 01/06/2023 0034   BILIRUBINUR Negative 08/10/2013 1229   KETONESUR 5 (A) 01/06/2023 0034   PROTEINUR 100 (A) 01/06/2023 0034   UROBILINOGEN 0.2 09/06/2010 1027   NITRITE NEGATIVE 01/06/2023 0034   LEUKOCYTESUR MODERATE (A) 01/06/2023 0034   LEUKOCYTESUR 3+ 08/10/2013 1229   Sepsis Labs: Invalid input(s):  "PROCALCITONIN", "LACTICIDVEN"  Microbiology: Recent Results (from the past 240 hour(s))  Urine Culture (for pregnant, neutropenic or urologic patients or patients with an indwelling urinary catheter)     Status: Abnormal (Preliminary result)   Collection Time: 01/06/23 12:35 AM   Specimen: Urine, Clean Catch  Result Value Ref Range Status   Specimen Description   Final    URINE, CLEAN CATCH Performed at [facility], [address] [place] [address] [place], [place]  [address]    Special Requests   Final    NONE Performed at [facility], [address] [place], [place] [address]    Culture   Final    Two isolates with different morphologies were identified as the same organism.The most resistant organism was reported. >=100,000 COLONIES/mL ESCHERICHIA COLI    Report Status PENDING  Incomplete   Organism ID, Bacteria ESCHERICHIA COLI (A)  Final      Susceptibility   Escherichia coli - MIC*    AMPICILLIN >=32 RESISTANT Resistant     CEFAZOLIN <=4 SENSITIVE Sensitive     CEFEPIME <=0.12 SENSITIVE Sensitive     CEFTRIAXONE <=0.25 SENSITIVE Sensitive     CIPROFLOXACIN <=0.25 SENSITIVE Sensitive     GENTAMICIN <=1 SENSITIVE Sensitive     IMIPENEM <=0.25 SENSITIVE Sensitive     NITROFURANTOIN <=16 SENSITIVE Sensitive     TRIMETH/SULFA >=320 RESISTANT Resistant     AMPICILLIN/SULBACTAM >=32 RESISTANT Resistant     PIP/TAZO <=4 SENSITIVE Sensitive     * >=100,000 COLONIES/mL ESCHERICHIA COLI  Resp Panel by RT-PCR (Flu A&B, Covid) Anterior Nasal Swab     Status: None   Collection Time: 01/06/23  1:17 AM   Specimen: Anterior Nasal Swab  Result Value Ref Range Status   SARS Coronavirus 2 by RT PCR NEGATIVE NEGATIVE Final    Comment: (NOTE) SARS-CoV-2 target nucleic acids are NOT DETECTED.  The SARS-CoV-2 RNA is generally detectable in upper respiratory specimens during the acute phase of infection. The lowest concentration of SARS-CoV-2 viral copies this assay can detect  is 138 copies/mL. A negative result does not preclude SARS-Cov-2 infection and should not be used as the sole basis for treatment or other patient management decisions. A negative result may occur with  improper specimen collection/handling, submission of specimen other than nasopharyngeal swab, presence of viral mutation(s) within the areas targeted by this assay, and inadequate number of viral copies(<138 copies/mL). A negative result must be combined with clinical observations, patient history, and epidemiological information. The expected result is Negative.  Fact Sheet for Patients:  EntrepreneurPulse.com.au  Fact Sheet for Healthcare Providers:  IncredibleEmployment.be  This test is no t yet approved or cleared by the [place] FDA and  has been authorized for detection and/or diagnosis of SARS-CoV-2 by FDA under an Emergency Use Authorization (EUA). This EUA will remain  in effect (meaning this test can be used) for the duration of the COVID-19 declaration under Section 564(b)(1) of the Act, 21 U.S.C.section 360bbb-3(b)(1), unless the authorization is terminated  or revoked sooner.       Influenza A by PCR NEGATIVE NEGATIVE Final   Influenza B by PCR NEGATIVE NEGATIVE Final    Comment: (NOTE) The Xpert Xpress SARS-CoV-2/FLU/RSV plus assay is intended as an aid in the diagnosis of influenza from Nasopharyngeal swab specimens and should not be used as a sole basis for treatment. Nasal washings and aspirates are unacceptable for Xpert Xpress SARS-CoV-2/FLU/RSV testing.  Fact Sheet for Patients: EntrepreneurPulse.com.au  Fact Sheet for Healthcare Providers: IncredibleEmployment.be  This test is not yet approved or cleared by the [place] FDA and has been authorized for detection and/or diagnosis of SARS-CoV-2 by FDA under an Emergency Use Authorization (EUA). This EUA will remain in effect  (meaning this test can be used) for the duration of the COVID-19 declaration under Section 564(b)(1) of the Act, 21 U.S.C. section 360bbb-3(b)(1), unless the authorization is terminated or revoked.  Performed at [facility], [address] [place],  [place] [address]     Radiology Studies: MR LIVER W WO CONTRAST  Result Date: 01/08/2023 CLINICAL DATA:  Liver lesions on CT scan for further characterization. EXAM: MRI ABDOMEN WITHOUT AND WITH CONTRAST TECHNIQUE: Multiplanar multisequence MR imaging of the abdomen was performed both before and after the administration of intravenous contrast. CONTRAST:  16mL GADAVIST GADOBUTROL 1 MMOL/ML IV SOLN COMPARISON:  01/07/2023 FINDINGS: Despite efforts by the technologist and patient, motion artifact is present on today's exam and could not be eliminated. This reduces exam sensitivity and specificity. Lower chest: Mild cardiomegaly. Suspected atelectasis in the left lower lobe. Hepatobiliary: The more caudad lesion in segment 4 of the liver along the falciform ligament represents focal steatosis, as shown on image 43 of series 9 where there is dropout of signal. The more cephalad segment 4 lesion measures 2.4 by 1.6 by 2.4 cm and demonstrates accentuated sharply defined T2 signal as well as peripheral nodular centripetal enhancement and delayed enhancement compatible with a benign hepatic hemangioma. No further workup of this lesion is required. A 5 mm T2 hyperintense lesion in the lateral segment left hepatic lobe on image 18 series 7 demonstrates T1 hypointensity and subsequent fill in with contrast enhancement strongly favoring a small benign hepatic hemangioma. This lesion is not felt to require further workup at this time. The gallbladder appears unremarkable. Sludge versus tiny layering gallstones in the gallbladder, for example on image 22 series 4. Pancreas:  Unremarkable Spleen:  Unremarkable Adrenals/Urinary Tract: 1.3 cm Bosniak category 1 cyst  of the left kidney lower pole on image 59 of series 21, benign and warranting no further workup. Adrenal glands appear normal. The kidneys appear otherwise unremarkable. Stomach/Bowel: Unremarkable Vascular/Lymphatic:  Unremarkable Other:  No supplemental non-categorized findings. Musculoskeletal: Lower lumbar loss of disc height and degenerative endplate findings associated spondylosis and degenerative disc disease. On coronal images were observe a left paracentral umbilical hernia containing adipose tissues. IMPRESSION: 1. Both previous lesions of concern in the lateral segment left hepatic lobe are benign, 1 of these is a benign hemangioma and the other represents focal steatosis. There is also a tiny third lesion in the lateral segment left hepatic lobe most compatible with a small hemangioma as well. These liver lesions do not warrant further workup. 2. Sludge versus tiny layering gallstones in the gallbladder. 3. Mild cardiomegaly. 4. Lower lumbar spondylosis and degenerative disc disease. 5. 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 Electronically Signed   By: Van Clines M.D.   On: 01/08/2023 19:59      Baer Hinton T. McCormick  If 7PM-7AM, please contact night-coverage [email or web address] 01/09/2023, 2:04 PM

## 2023-01-09 NOTE — Consult Note (Signed)
ANTICOAGULATION CONSULT NOTE   Pharmacy Consult for IV Heparin Indication:  New Onset Atrial Fibrillation  Patient Measurements: Height: 5\' 6"  (167.6 cm) Weight: 94.5 kg (208 lb 4.8 oz) IBW/kg (Calculated) : 59.3 Heparin Dosing Weight: 80.2 kg  Labs: Recent Labs    01/06/23 0333 01/06/23 1606 01/06/23 1606 01/06/23 2317 01/07/23 0428 01/07/23 0428 01/07/23 0556 01/07/23 1935 01/08/23 0643 01/09/23 0100  HGB  --   --   --   --  14.9   < >  --   --  14.2 12.7  HCT  --   --   --   --  46.1*  --   --   --  44.3 39.9  PLT  --   --   --   --  141*  --   --   --  139* 115*  APTT  --  30  --   --   --   --   --   --   --   --   LABPROT  --  15.1  --   --   --   --   --   --   --   --   INR  --  1.2  --   --   --   --   --   --   --   --   HEPARINUNFRC  --  <0.10*   < > 1.04*  --   --   --  0.26*  --  0.21*  CREATININE  --  1.11*  --   --   --   --  0.97  --  0.96  --   TROPONINIHS 84*  --   --   --   --   --   --   --   --   --    < > = values in this interval not displayed.     Estimated Creatinine Clearance: 74.9 mL/min (by C-G formula based on SCr of 0.96 mg/dL).   Medical History: Past Medical History:  Diagnosis Date   CHF (congestive heart failure) ([place])    Diabetes mellitus without complication (HCC)    Hypertension     Medications:  No history of chronic anticoagulant use PTA  Assessment: 57 year old female who has a past medical history of hypertension, diabetes, and congestive heart failure presenting to the ED with nausea and vomiting. Upon further assessment and EKG results, was found to have new onset atrial fibrillation. Pharmacy has been consulted to initiation and titrate heparin infusion. Baseline labs have been ordered and are pending.  Goal of Therapy:  Heparin level 0.3-0.7 units/ml Monitor platelets by anticoagulation protocol: Yes   2/5 2317 HL 1.04, supratherapeutic, 1200 un/hr 2/6 1200 - resume heparin gtt now and stop at mid-night per MD  orders (colonoscopy tomorrow 2/7) 2/6 1935 HL 0.26, subtherapeutic; 1050 un/hr 2/7 Heparin drip held for colonoscopy. Restarted at [place] 2/08 0100 HL 0.21, subtherapeutic  Plan:  Bolus 1200 units x 1 Increase heparin infusion to 1300 units/hr Will recheck HL in 6 hrs after rate change Continue to monitor H&H and platelets per protocol  Renda Rolls, PharmD, [facility] 01/09/2023 1:34 AM

## 2023-01-09 NOTE — Progress Notes (Signed)
GI Inpatient Follow-up Note  Patient Identification: [name] is a 57 y.o. female seen for follow up of n/v/abd pain.   Subjective: all GI symptoms have resolved. Denies any n/v or abd pain currently. No dysphagia. Tolerated sausage, eggs, and pancakes for breakfast. No acute events overnight. Had BM overnight. Overall feeling a lot better.  Scheduled Inpatient Medications:   (feeding supplement) PROSource Plus  30 mL Oral TID BM   apixaban  5 mg Oral BID   carvedilol  6.25 mg Oral BID WC   feeding supplement  1 Container Oral TID BM   insulin aspart  0-15 Units Subcutaneous Q4H   [START ON 01/10/2023] losartan  25 mg Oral Daily   multivitamin with minerals  1 tablet Oral Daily   pantoprazole (PROTONIX) IV  40 mg Intravenous Q12H   spironolactone  12.5 mg Oral Daily    Continuous Inpatient Infusions:    sodium chloride 50 mL/hr at 01/09/23 0345   cefTRIAXone (ROCEPHIN)  IV 2 g (01/09/23 0158)    PRN Inpatient Medications:  acetaminophen **OR** acetaminophen, hydrALAZINE, morphine injection, ondansetron **OR** ondansetron (ZOFRAN) IV, polyethylene glycol-electrolytes  Review of Systems: Constitutional: Weight is stable.  Eyes: No changes in vision. ENT: No oral lesions, sore throat.  GI: see HPI.  Heme/Lymph: No easy bruising.  CV: No chest pain.  GU: No hematuria.  Integumentary: No rashes.  Neuro: No headaches.  Psych: No depression/anxiety.  Endocrine: No heat/cold intolerance.  Allergic/Immunologic: No urticaria.  Resp: No cough, SOB.  Musculoskeletal: No joint swelling.    Physical Examination: BP (!) 148/88 (BP Location: Right Arm)   Pulse 62   Temp 97.7 F (36.5 C) (Oral)   Resp 16   Ht 5\' 6"  (1.676 m)   Wt 94.5 kg   LMP 12/03/2022 (Approximate)   SpO2 100%   BMI 33.62 kg/m  Gen: NAD, alert and oriented x 4, sitting in chair eating lunch. HEENT: PEERLA, EOMI, Neck: supple, no JVD or thyromegaly Chest: CTA bilaterally, no wheezes,  crackles, or other adventitious sounds CV: RRR, no m/g/c/r Abd: soft, NT, ND, +BS in all four quadrants; no HSM, guarding, ridigity, or rebound tenderness Ext: no edema, well perfused with 2+ pulses, Skin: no rash or lesions noted Lymph: no LAD  Data: Lab Results  Component Value Date   WBC 4.3 01/09/2023   HGB 12.7 01/09/2023   HCT 39.9 01/09/2023   MCV 80.3 01/09/2023   PLT 115 (L) 01/09/2023   Recent Labs  Lab 01/07/23 0428 01/08/23 0643 01/09/23 0100  HGB 14.9 14.2 12.7   Lab Results  Component Value Date   NA 135 01/09/2023   K 3.6 01/09/2023   CL 103 01/09/2023   CO2 24 01/09/2023   BUN 17 01/09/2023   CREATININE 0.88 01/09/2023   Lab Results  Component Value Date   ALT 10 01/09/2023   AST 14 (L) 01/09/2023   ALKPHOS 36 (L) 01/09/2023   BILITOT 1.0 01/09/2023   Recent Labs  Lab 01/06/23 1606  APTT 30  INR 1.2    EGD-01/07/23--Impression: Normal duodenal bulb, first portion of the duodenum and second portion of the duodenum. - Gastritis. Biopsied. - LA Grade A esophagitis with no bleeding. - Z-line regular.  - Esophagogastric landmarks identified. - No endoscopic esophageal abnormality to explain patient's dysphagia   Colonoscopy 01/08/23--semi liquid stool in entire colon, prpe inadequate. Diverticulosis. Repeat 2 months d/t bowel prep  MRP-IMPRESSION: 01/08/23- 1. Both previous lesions of concern in the lateral segment left  hepatic lobe are benign, 1 of these is a benign hemangioma and the other represents focal steatosis. There is also a tiny third lesion in the lateral segment left hepatic lobe most compatible with a small hemangioma as well. These liver lesions do not warrant further workup. 2. Sludge versus tiny layering gallstones in the gallbladder. 3. Mild cardiomegaly. 4. Lower lumbar spondylosis and degenerative disc disease. 5. 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     Assessment/Plan: [name] is a 57 y.o.  female admitted for nausea/vomiting, poor PO intake.   N/V/Abd pain/weight loss - symptoms have resolved and is tolerating normal diet without recurrent symptoms. EGD as above, colonoscopy w/ poor prep but no large lesions to explain abnormalities. Abd imaging did not reveal etiology of symptoms. Was using heavy doses of ibuprofen prior to admission and we discussed avoiding these in future. Would recommend PPI at d/c. Symptoms may have been related to afib w/ RVR, new dx of CHF, etc.  Atrial fib CHF UTI  Recommendations: -D/c on PPI  -Avoid nsaids  -Colonoscopy as outpatient for screening as inpatient colonoscopy had inadequate prep.  -Will see her back in GI outpatient clinic  Case discussed w/ Dr. Virgina Jock.Will sign off. Please call if needed   Please call with questions or concerns.    Ronney Asters, PA-C [facility]

## 2023-01-09 NOTE — Progress Notes (Signed)
Nutrition Follow-up  DOCUMENTATION CODES:   Obesity unspecified  INTERVENTION:   -D/c Prosource Plus -D/c Boost Breeze -MVI with minerals daily -Glucerna Shake po TID, each supplement provides 220 kcal and 10 grams of protein  -Liberalize diet to carb modified for wider variety of meal selections  NUTRITION DIAGNOSIS:   Inadequate oral intake related to altered GI function as evidenced by NPO status.  Progressing; advanced to PO diet on 01/08/23  GOAL:   Patient will meet greater than or equal to 90% of their needs  Progressing   MONITOR:   PO intake, Supplement acceptance, Diet advancement  REASON FOR ASSESSMENT:   Malnutrition Screening Tool    ASSESSMENT:   Pt with medical history significant for Diabetes and high blood pressure, as well as hospitalization in 2020 for hypertensive emergency, who presents with a several week history of postprandial vomiting, difficulty swallowing down solid food, and has been limited to liquid diet with soups, juices and water,.  She has been unable to hold on her medication.  2/7- s/p colonoscopy- prep suboptimal; CT revealed no colon or cupper GI findings  Reviewed I/O's: +595 ml x 24 hours and +1.6 L since admission  UOP: 400 ml x 24 hours  Pt working with therapy team at time of visit.   Pt with no further vomiting and is tolerating PO diet. No meal completion data available to assess at this time.  Rehab team recommending home health services at discharge.    Medications reviewed and include aldactone and 0.9% sodium chloride infusion @ 50 ml/hr.   Labs reviewed: CBGS: 126-156 (inpatient orders for glycemic control are 0-15 units insulin aspart every 4 hours).    Diet Order:   Diet Order             Diet heart healthy/carb modified Room service appropriate? Yes; Fluid consistency: Thin  Diet effective now                   EDUCATION NEEDS:   Education needs have been addressed  Skin:  Skin Assessment:  Reviewed RN Assessment  Last BM:  01/08/23  Height:   Ht Readings from Last 1 Encounters:  01/06/23 5\' 6"  (1.676 m)    Weight:   Wt Readings from Last 1 Encounters:  01/06/23 94.5 kg    Ideal Body Weight:  59.1 kg  BMI:  Body mass index is 33.62 kg/m.  Estimated Nutritional Needs:   Kcal:  1800-2000  Protein:  90-105 grams  Fluid:  > 1.8 L    Loistine Chance, RD, LDN, [place] Registered Dietitian II Certified Diabetes Care and Education Specialist Please refer to [facility] for RD and/or RD on-call/weekend/after hours pager

## 2023-01-10 ENCOUNTER — Other Ambulatory Visit (HOSPITAL_COMMUNITY): Payer: Self-pay

## 2023-01-10 ENCOUNTER — Telehealth (HOSPITAL_COMMUNITY): Payer: Self-pay | Admitting: Pharmacy Technician

## 2023-01-10 DIAGNOSIS — R1310 Dysphagia, unspecified: Secondary | ICD-10-CM | POA: Diagnosis not present

## 2023-01-10 DIAGNOSIS — D72819 Decreased white blood cell count, unspecified: Secondary | ICD-10-CM | POA: Insufficient documentation

## 2023-01-10 DIAGNOSIS — I4819 Other persistent atrial fibrillation: Secondary | ICD-10-CM | POA: Diagnosis not present

## 2023-01-10 DIAGNOSIS — D696 Thrombocytopenia, unspecified: Secondary | ICD-10-CM | POA: Insufficient documentation

## 2023-01-10 DIAGNOSIS — R634 Abnormal weight loss: Secondary | ICD-10-CM | POA: Diagnosis not present

## 2023-01-10 DIAGNOSIS — I5021 Acute systolic (congestive) heart failure: Secondary | ICD-10-CM | POA: Diagnosis not present

## 2023-01-10 LAB — CBC
HCT: 38.6 % (ref 36.0–46.0)
Hemoglobin: 12.8 g/dL (ref 12.0–15.0)
MCH: 26 pg (ref 26.0–34.0)
MCHC: 33.2 g/dL (ref 30.0–36.0)
MCV: 78.3 fL — ABNORMAL LOW (ref 80.0–100.0)
Platelets: 135 10*3/uL — ABNORMAL LOW (ref 150–400)
RBC: 4.93 MIL/uL (ref 3.87–5.11)
RDW: 17.4 % — ABNORMAL HIGH (ref 11.5–15.5)
WBC: 2.6 10*3/uL — ABNORMAL LOW (ref 4.0–10.5)
nRBC: 0.8 % — ABNORMAL HIGH (ref 0.0–0.2)

## 2023-01-10 LAB — CBC WITH DIFFERENTIAL/PLATELET
Abs Immature Granulocytes: 0 10*3/uL (ref 0.00–0.07)
Basophils Absolute: 0 10*3/uL (ref 0.0–0.1)
Basophils Relative: 1 %
Eosinophils Absolute: 0.2 10*3/uL (ref 0.0–0.5)
Eosinophils Relative: 6 %
HCT: 38.7 % (ref 36.0–46.0)
Hemoglobin: 12.6 g/dL (ref 12.0–15.0)
Immature Granulocytes: 0 %
Lymphocytes Relative: 46 %
Lymphs Abs: 1.2 10*3/uL (ref 0.7–4.0)
MCH: 25.6 pg — ABNORMAL LOW (ref 26.0–34.0)
MCHC: 32.6 g/dL (ref 30.0–36.0)
MCV: 78.5 fL — ABNORMAL LOW (ref 80.0–100.0)
Monocytes Absolute: 0.4 10*3/uL (ref 0.1–1.0)
Monocytes Relative: 16 %
Neutro Abs: 0.8 10*3/uL — ABNORMAL LOW (ref 1.7–7.7)
Neutrophils Relative %: 31 %
Platelets: 136 10*3/uL — ABNORMAL LOW (ref 150–400)
RBC: 4.93 MIL/uL (ref 3.87–5.11)
RDW: 17.2 % — ABNORMAL HIGH (ref 11.5–15.5)
Smear Review: NORMAL
WBC: 2.5 10*3/uL — ABNORMAL LOW (ref 4.0–10.5)
nRBC: 1.2 % — ABNORMAL HIGH (ref 0.0–0.2)

## 2023-01-10 LAB — COMPREHENSIVE METABOLIC PANEL
ALT: 11 U/L (ref 0–44)
AST: 18 U/L (ref 15–41)
Albumin: 2.5 g/dL — ABNORMAL LOW (ref 3.5–5.0)
Alkaline Phosphatase: 39 U/L (ref 38–126)
Anion gap: 7 (ref 5–15)
BUN: 15 mg/dL (ref 6–20)
CO2: 21 mmol/L — ABNORMAL LOW (ref 22–32)
Calcium: 8.9 mg/dL (ref 8.9–10.3)
Chloride: 103 mmol/L (ref 98–111)
Creatinine, Ser: 0.8 mg/dL (ref 0.44–1.00)
GFR, Estimated: >60 mL/min (ref >60)
Glucose, Bld: 96 mg/dL (ref 70–99)
Potassium: 3.1 mmol/L — ABNORMAL LOW (ref 3.5–5.1)
Sodium: 131 mmol/L — ABNORMAL LOW (ref 135–145)
Total Bilirubin: 1 mg/dL (ref 0.3–1.2)
Total Protein: 5.7 g/dL — ABNORMAL LOW (ref 6.5–8.1)

## 2023-01-10 LAB — GLUCOSE, CAPILLARY
Glucose-Capillary: 111 mg/dL — ABNORMAL HIGH (ref 70–99)
Glucose-Capillary: 170 mg/dL — ABNORMAL HIGH (ref 70–99)
Glucose-Capillary: 188 mg/dL — ABNORMAL HIGH (ref 70–99)
Glucose-Capillary: 235 mg/dL — ABNORMAL HIGH (ref 70–99)
Glucose-Capillary: 90 mg/dL (ref 70–99)
Glucose-Capillary: 95 mg/dL (ref 70–99)
Glucose-Capillary: 98 mg/dL (ref 70–99)

## 2023-01-10 LAB — PHOSPHORUS: Phosphorus: 2.4 mg/dL — ABNORMAL LOW (ref 2.5–4.6)

## 2023-01-10 LAB — URINE CULTURE

## 2023-01-10 LAB — AFP TUMOR MARKER: AFP, Serum, Tumor Marker: 2.8 ng/mL (ref 0.0–9.2)

## 2023-01-10 LAB — MAGNESIUM: Magnesium: 2.1 mg/dL (ref 1.7–2.4)

## 2023-01-10 MED ORDER — FUROSEMIDE 20 MG PO TABS: 20.0000 mg | ORAL_TABLET | Freq: Every day | ORAL | 1 refills | Status: DC

## 2023-01-10 MED ORDER — ADULT MULTIVITAMIN W/MINERALS CH: 1.0000 | ORAL_TABLET | Freq: Every day | ORAL | Status: DC

## 2023-01-10 MED ORDER — APIXABAN 5 MG PO TABS: 5.0000 mg | ORAL_TABLET | Freq: Two times a day (BID) | ORAL | 1 refills | Status: DC

## 2023-01-10 MED ORDER — POTASSIUM CHLORIDE CRYS ER 20 MEQ PO TBCR
20.0000 meq | EXTENDED_RELEASE_TABLET | Freq: Every day | ORAL | Status: DC
  Administered 2023-01-11: 20 meq via ORAL
  Filled 2023-01-10: qty 1

## 2023-01-10 MED ORDER — FUROSEMIDE 20 MG PO TABS
20.0000 mg | ORAL_TABLET | Freq: Every day | ORAL | Status: DC
  Administered 2023-01-11: 20 mg via ORAL
  Filled 2023-01-10: qty 1

## 2023-01-10 MED ORDER — POTASSIUM CHLORIDE CRYS ER 20 MEQ PO TBCR
40.0000 meq | EXTENDED_RELEASE_TABLET | ORAL | Status: AC
  Administered 2023-01-10 (×2): 40 meq via ORAL
  Filled 2023-01-10 (×2): qty 2

## 2023-01-10 MED ORDER — SPIRONOLACTONE 25 MG PO TABS: 12.5000 mg | ORAL_TABLET | Freq: Every day | ORAL | 1 refills | Status: AC

## 2023-01-10 MED ORDER — EMPAGLIFLOZIN 10 MG PO TABS
10.0000 mg | ORAL_TABLET | Freq: Every day | ORAL | Status: DC
  Administered 2023-01-10 – 2023-01-11 (×2): 10 mg via ORAL
  Filled 2023-01-10 (×2): qty 1

## 2023-01-10 MED ORDER — DAPAGLIFLOZIN PROPANEDIOL 10 MG PO TABS: 10.0000 mg | ORAL_TABLET | Freq: Every day | ORAL | 1 refills | Status: AC

## 2023-01-10 MED ORDER — POTASSIUM CHLORIDE CRYS ER 20 MEQ PO TBCR: 20.0000 meq | EXTENDED_RELEASE_TABLET | Freq: Every day | ORAL | 1 refills | Status: DC

## 2023-01-10 MED ORDER — ATORVASTATIN CALCIUM 20 MG PO TABS: 20.0000 mg | ORAL_TABLET | Freq: Every day | ORAL | 2 refills | Status: AC

## 2023-01-10 MED ORDER — SACUBITRIL-VALSARTAN 24-26 MG PO TABS: 1.0000 | ORAL_TABLET | Freq: Two times a day (BID) | ORAL | 1 refills | Status: DC

## 2023-01-10 MED ORDER — SACUBITRIL-VALSARTAN 24-26 MG PO TABS
1.0000 | ORAL_TABLET | Freq: Two times a day (BID) | ORAL | Status: DC
  Administered 2023-01-10 – 2023-01-11 (×3): 1 via ORAL
  Filled 2023-01-10 (×3): qty 1

## 2023-01-10 NOTE — Telephone Encounter (Signed)
Patient Advocate Encounter  Prior Authorization for Farxiga 10 mg  has been approved.     Effective dates: 01/10/2023 through 01/11/2024  Patients co-pay is $4.00.     Lyndel Safe, [place] Patient Advocate Specialist [place] Patient Advocate Team Direct Number: [phone]  Fax: [phone]

## 2023-01-10 NOTE — Telephone Encounter (Signed)
Patient Advocate Encounter   Received notification that prior authorization for Entresto 24-26 mg is required.   PA submitted on 01/10/2023 Prior Authorization Request [id number] [employer] [facility] Medicaid Status is pending       Lyndel Safe, [place] Patient Advocate Specialist [place] Patient Advocate Team Direct Number: [phone]  Fax: [phone]

## 2023-01-10 NOTE — Plan of Care (Signed)
  Problem: Education: Goal: Knowledge of General Education information will improve Description: Including pain rating scale, medication(s)/side effects and non-pharmacologic comfort measures Outcome: Progressing   Problem: Health Behavior/Discharge Planning: Goal: Ability to manage health-related needs will improve Outcome: Progressing   Problem: Safety: Goal: Ability to remain free from injury will improve Outcome: Progressing   Problem: Skin Integrity: Goal: Risk for impaired skin integrity will decrease Outcome: Progressing   Problem: Education: Goal: Knowledge of disease or condition will improve Outcome: Progressing

## 2023-01-10 NOTE — Progress Notes (Signed)
Rounding Note    Patient Name: [name] Date of Encounter: 01/10/2023  [place] Cardiologist: New-Arida  Subjective   Patient is feeling better. No further GI issues. Kidney function stable.  Inpatient Medications    Scheduled Meds:  apixaban  5 mg Oral BID   carvedilol  6.25 mg Oral BID WC   feeding supplement (GLUCERNA SHAKE)  237 mL Oral TID BM   insulin aspart  0-15 Units Subcutaneous Q4H   losartan  25 mg Oral Daily   multivitamin with minerals  1 tablet Oral Daily   pantoprazole (PROTONIX) IV  40 mg Intravenous Q12H   potassium chloride  40 mEq Oral Q4H   spironolactone  12.5 mg Oral Daily   Continuous Infusions:  PRN Meds: acetaminophen **OR** acetaminophen, hydrALAZINE, morphine injection, ondansetron **OR** ondansetron (ZOFRAN) IV, polyethylene glycol-electrolytes   Vital Signs    Vitals:   01/09/23 1942 01/10/23 0013 01/10/23 0315 01/10/23 0807  BP: 134/85 111/88 (!) 130/98 123/89  Pulse: 70 67 71 84  Resp: 18 18 20 16  $ Temp: (!) 97.5 F (36.4 C) 97.7 F (36.5 C) 97.6 F (36.4 C) (!) 97.4 F (36.3 C)  TempSrc:   Oral   SpO2: 99% 94% 100% 97%  Weight:      Height:        Intake/Output Summary (Last 24 hours) at 01/10/2023 0817 Last data filed at 01/10/2023 0700 Gross per 24 hour  Intake 459.44 ml  Output 0 ml  Net 459.44 ml      01/06/2023   12:36 AM 02/01/2020   12:33 PM 12/28/2019   12:37 PM  Last 3 Weights  Weight (lbs) 208 lb 4.8 oz 262 lb 9.6 oz 264 lb  Weight (kg) 94.484 kg 119.115 kg 119.75 kg      Telemetry    Afib HR 60s, PVCs - Personally Reviewed  ECG    No new - Personally Reviewed  Physical Exam   GEN: No acute distress.   Neck: No JVD Cardiac: RRR, no murmurs, rubs, or gallops.  Respiratory: Clear to auscultation bilaterally. GI: Soft, nontender, non-distended  MS: No edema; No deformity. Neuro:  Nonfocal  Psych: Normal affect   Labs    High Sensitivity Troponin:   Recent Labs  Lab  01/06/23 0034 01/06/23 0333  TROPONINIHS 93* 84*     Chemistry Recent Labs  Lab 01/06/23 0034 01/06/23 0035 01/06/23 1606 01/08/23 0643 01/09/23 0100 01/10/23 0526  NA  --  137   < > 134* 135 131*  K  --  4.4   < > 3.6 3.6 3.1*  CL  --  95*   < > 101 103 103  CO2  --  24   < > 23 24 21*  GLUCOSE  --  184*   < > 104* 129* 96  BUN  --  23*   < > 20 17 15  $ CREATININE  --  1.21*   < > 0.96 0.88 0.80  CALCIUM  --  11.2*   < > 9.4 9.1 8.9  MG 2.4  --   --   --  1.6* 2.1  PROT  --  8.3*  --   --  5.9* 5.7*  ALBUMIN  --  3.4*  --   --  2.6* 2.5*  AST  --  26  --   --  14* 18  ALT  --  16  --   --  10 11  ALKPHOS  --  53  --   --  36* 39  BILITOT  --  1.5*  --   --  1.0 1.0  GFRNONAA  --  52*   < > >60 >60 >60  ANIONGAP  --  18*   < > 10 8 7   $ < > = values in this interval not displayed.    Lipids No results for input(s): "CHOL", "TRIG", "HDL", "LABVLDL", "LDLCALC", "CHOLHDL" in the last 168 hours.  Hematology Recent Labs  Lab 01/08/23 0643 01/09/23 0100 01/10/23 0526  WBC 3.0* 4.3 2.6*  RBC 5.57* 4.97  4.95 4.93  HGB 14.2 12.7 12.8  HCT 44.3 39.9 38.6  MCV 79.5* 80.3 78.3*  MCH 25.5* 25.6* 26.0  MCHC 32.1 31.8 33.2  RDW 17.4* 17.2* 17.4*  PLT 139* 115* 135*   Thyroid  Recent Labs  Lab 01/06/23 0034  TSH 0.689    BNP Recent Labs  Lab 01/06/23 0034  BNP 553.3*    DDimer No results for input(s): "DDIMER" in the last 168 hours.   Radiology    MR LIVER W WO CONTRAST  Result Date: 01/08/2023 CLINICAL DATA:  Liver lesions on CT scan for further characterization. EXAM: MRI ABDOMEN WITHOUT AND WITH CONTRAST TECHNIQUE: Multiplanar multisequence MR imaging of the abdomen was performed both before and after the administration of intravenous contrast. CONTRAST:  6m GADAVIST GADOBUTROL 1 MMOL/ML IV SOLN COMPARISON:  01/07/2023 FINDINGS: Despite efforts by the technologist and patient, motion artifact is present on today's exam and could not be eliminated. This reduces  exam sensitivity and specificity. Lower chest: Mild cardiomegaly. Suspected atelectasis in the left lower lobe. Hepatobiliary: The more caudad lesion in segment 4 of the liver along the falciform ligament represents focal steatosis, as shown on image 43 of series 9 where there is dropout of signal. The more cephalad segment 4 lesion measures 2.4 by 1.6 by 2.4 cm and demonstrates accentuated sharply defined T2 signal as well as peripheral nodular centripetal enhancement and delayed enhancement compatible with a benign hepatic hemangioma. No further workup of this lesion is required. A 5 mm T2 hyperintense lesion in the lateral segment left hepatic lobe on image 18 series 7 demonstrates T1 hypointensity and subsequent fill in with contrast enhancement strongly favoring a small benign hepatic hemangioma. This lesion is not felt to require further workup at this time. The gallbladder appears unremarkable. Sludge versus tiny layering gallstones in the gallbladder, for example on image 22 series 4. Pancreas:  Unremarkable Spleen:  Unremarkable Adrenals/Urinary Tract: 1.3 cm Bosniak category 1 cyst of the left kidney lower pole on image 59 of series 21, benign and warranting no further workup. Adrenal glands appear normal. The kidneys appear otherwise unremarkable. Stomach/Bowel: Unremarkable Vascular/Lymphatic:  Unremarkable Other:  No supplemental non-categorized findings. Musculoskeletal: Lower lumbar loss of disc height and degenerative endplate findings associated spondylosis and degenerative disc disease. On coronal images were observe a left paracentral umbilical hernia containing adipose tissues. IMPRESSION: 1. Both previous lesions of concern in the lateral segment left hepatic lobe are benign, 1 of these is a benign hemangioma and the other represents focal steatosis. There is also a tiny third lesion in the lateral segment left hepatic lobe most compatible with a small hemangioma as well. These liver lesions do  not warrant further workup. 2. Sludge versus tiny layering gallstones in the gallbladder. 3. Mild cardiomegaly. 4. Lower lumbar spondylosis and degenerative disc disease. 5. 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 Electronically Signed   By: WVan Clines  M.D.   On: 01/08/2023 19:59    Cardiac Studies   Echo 01/06/23 Left ventricular ejection fraction, by estimation, is 35 to 40%. The  left ventricle has moderately decreased function. The left ventricle  demonstrates global hypokinesis. The left ventricular internal cavity size  was moderately dilated. There is mild   left ventricular hypertrophy. Left ventricular diastolic parameters are  indeterminate.   2. Right ventricular systolic function is normal. The right ventricular  size is normal. Tricuspid regurgitation signal is inadequate for assessing  PA pressure.   3. Left atrial size was moderately dilated.   4. Right atrial size was mildly dilated.   5. The mitral valve is normal in structure. Mild to moderate mitral valve  regurgitation. No evidence of mitral stenosis.   6. The aortic valve is normal in structure. Aortic valve regurgitation is  mild to moderate. Aortic valve sclerosis/calcification is present, without  any evidence of aortic stenosis.   7. Pulmonic valve regurgitation is moderate.   8. Aortic dilatation noted. There is mild dilatation of the aortic root,  measuring 39 mm.   9. The inferior vena cava is normal in size with greater than 50%  respiratory variability, suggesting right atrial pressure of 3 mmHg.  Patient Profile     57 y.o. female with a history of hypertension, diabetes, congestive heart failure, who is being seen and evaluated for HFrEF and new onset atrial fibrillation.     Assessment & Plan    Intractable Vomiting/dysphagia/lactic acidosis/ anion gap metabolic acidosis/starvation ketosis - related to poor oral intake and dehydration - EGD showed gastritis - no further  N/V - GI signed off, plan for colonoscopy as outpatient   Acute HFrEF - LVEF 35-40% - BNP 553 - Echo showed LVEF 55-60% - continue Coreg and spironolactone. We will add on Entresto and Jardiance - kidney function is normal - appears fairly euvolemic on exam - plan for R/L heart cath as outpatient   New onset Afib - rate controlled afib 70-80s -CHADSVASC of at least 4 - continue Eliquis 47m BID - Plan on 4 weeks of a/c followed by outpatient cardioversion   Elevated troponin - HS troponin elevated to 93 - suspect supply demand mismatch in the setting of multiple acute issues - No ischemic changes on EKG - no chest pain reported - Echo showed reduced EF - plan as above   Hypertensive urgency - BP up to 185/104 - restarted on Coreg 6.235mID - continue Entresto, spiro and Jardiance   AKI - resolved  For questions or updates, please contact [facility] consult [email or web address] for contact info under        Signed, Ketsia Linebaugh H Ninfa MeekerPA-C  01/10/2023, 8:17 AM

## 2023-01-10 NOTE — Discharge Summary (Addendum)
Physician Discharge Summary  [name] [id number] DOB: 1966-09-17 DOA: 01/06/2023  PCP: Kerri Perches, PA-C  Admit date: 01/06/2023 Discharge date: 01/10/2023 Admitted From: Home. Disposition: Home. Recommendations for Outpatient Follow-up:  Follow up with PCP in in 1 to 2 weeks or sooner if needed Outpatient follow-up with cardiology as below Check fluid status, CMP and CBC in 1 week. Please follow up on the following pending results: None  Home Health: PT/OT/RN Equipment/Devices: Rolling walker and 3 in 1 commode  Discharge Condition: Stable CODE STATUS: Full code  Follow-up Information     Kerri Perches, PA-C. Schedule an appointment as soon as possible for a visit in 1 week(s).   Specialty: Physician Assistant Contact information: [place] [address] [phone]         Kerri Perches, PA-C. Schedule an appointment as soon as possible for a visit in 1 week(s).   Specialty: Physician Assistant Contact information: [place] [address] [phone]         Annamaria Helling, DO Follow up in 2 month(s).   Specialty: Gastroenterology Contact information: [facility] [address] [place] [address] [facility] course 57 year old F with PMH of DM-2, diastolic CHF, hypertension and obesity presenting with prandial emesis, unintentional weight loss and dysphagia for quite some time and admitted for intractable vomiting AKI, unintentional weight loss, UTI and hypertensive urgency.  CT chest, abdomen and pelvis showed 2 indeterminate hypodense hepatic lesions, fibroid uterus, mildly prominent endometrium in postmenopausal setting measuring about 9 mm, mild enlargement of main pulmonary artery, hepatic steatosis, mild central lobar groundglass opacities at lung bases. Patient also had new onset A-fib with RVR and acute systolic CHF.  Cardiology and GI  consulted.  Started on IV ceftriaxone for possible UTI.   EGD showed gastritis.  Colonoscopy with stool in entire examined colon, nonbleeding internal hemorrhoid and diverticulosis in the rectosigmoid colon and sigmoid colon.  GI recommended advancing diet and repeat colonoscopy in 2 months outpatient.  MRI liver ordered to evaluate hepatic lesion and showed benign hemangioma, possible focal steatosis and gallbladder sludge.  Patient's GI symptoms resolved.  She tolerated regular diet and cleared for discharge.    In regards to A-fib and CHF, cardiology started on appropriate cardiac medication (Coreg, Entresto, Aldactone, Farxiga, p.o. KCl and Eliquis) cleared patient for discharge for outpatient follow-up.  Plan is to consider cardioversion and ischemic evaluation outpatient.  Overall, emesis has resolved.  She is tolerating diet.  See individual problem list below for more.   Problems addressed during this hospitalization Principal Problem:   Intractable vomiting Active Problems:   Dysphagia   High anion gap metabolic acidosis   Lactic acidosis   Urinary tract infection   Hypercalcemia   Weight loss   AKI (acute kidney injury) ([place])   Hypertensive urgency   Elevated troponin   Diabetes mellitus without complication ([place])   New onset atrial fibrillation (HCC)   Acute HFrEF (heart failure with reduced ejection fraction) (HCC)   Dilated cardiomyopathy (HCC)   Leukopenia   Thrombocytopenia (HCC)   Intractable vomiting/dysphagia/unintentional weight loss: Unclear etiology of this but resolved.   EGD with gastritis and esophagitis.  Colonoscopy without significant finding.  MRI as above.  She also have acute CHF which might be contributing. -Continue Protonix on discharge. -GI recommended repeat colonoscopy in 2 months   Acute systolic CHF: TTE  with LVEF of 35 to 40% (previously 55%).  BNP elevated to 553.  Appears euvolemic on exam.  Cleared for discharge by cardiology on medications as  above. -Ischemic evaluation outpatient   New onset A-fib: Rate controlled.  The vascular score > 4. -Discharged on Coreg and Eliquis -Possible cardioversion in 3 weeks outpatient   Lactic acidosis/anion gap metabolic acidosis/starvation ketosis: Resolved.   Urinary tract infection?  Urine culture with E. coli but patient denies UTI symptoms. -Received one from 2/5-2/8.   AKI: Resolved.   Uncontrolled NIDDM-2: A1c 8.2%.  CBG within acceptable range but NPO.  Seems to be on metformin at home. -Discharged on home metformin -Farxiga added. -Started Lipitor 20 mg daily   Hypercalcemia: Likely secondary.  Resolved.   Hypertensive urgency: BP 189/122 on arrival.  Resolved. -Cardiac meds as below.   Elevated troponin: Likely demand ischemia. -Cardiology planning ischemic workup outpatient  Leukopenia/thrombocytopenia: Unclear etiology of this -Repeat CBC outpatient  Hypokalemia: K3.1.  Received p.o. KCl 40 x 2 prior to discharge. -Discharge on p.o. KCl 20 mill equivalent daily per cardiology recommendation   Physical deconditioning: -HH PT/OT/rolling walker and 3 in 1 commode ordered.  Nutrition Problem: Inadequate oral intake Etiology: altered GI function Signs/Symptoms: NPO status Interventions: Boost Breeze, MVI, Prostat     Vital signs Vitals:   01/10/23 0013 01/10/23 0315 01/10/23 0807 01/10/23 1214  BP: 111/88 (!) 130/98 123/89 109/67  Pulse: 67 71 84 75  Temp: 97.7 F (36.5 C) 97.6 F (36.4 C) (!) 97.4 F (36.3 C) (!) 97.3 F (36.3 C)  Resp: 18 20 16 18  $ Height:      Weight:      SpO2: 94% 100% 97% 96%  TempSrc:  Oral Oral Oral  BMI (Calculated):         Discharge exam  GENERAL: No apparent distress.  Nontoxic. HEENT: MMM.  Vision and hearing grossly intact.  NECK: Supple.  No apparent JVD.  RESP:  No IWOB.  Fair aeration bilaterally. CVS:  RRR. Heart sounds normal.  ABD/GI/GU: BS+. Abd soft, NTND.  MSK/EXT:  Moves extremities. No apparent  deformity. No edema.  SKIN: no apparent skin lesion or wound NEURO: Awake and alert. Oriented appropriately.  No apparent focal neuro deficit. PSYCH: Calm. Normal affect.  Discharge Instructions Discharge Instructions     (HEART FAILURE PATIENTS) Call MD:  Anytime you have any of the following symptoms: 1) 3 pound weight gain in 24 hours or 5 pounds in 1 week 2) shortness of breath, with or without a dry hacking cough 3) swelling in the hands, feet or stomach 4) if you have to sleep on extra pillows at night in order to breathe.   Complete by: As directed    Call MD for:  persistant nausea and vomiting   Complete by: As directed    Call MD for:  severe uncontrolled pain   Complete by: As directed    Diet - low sodium heart healthy   Complete by: As directed    Diet Carb Modified   Complete by: As directed    Discharge instructions   Complete by: As directed    It has been a pleasure taking care of you!  You were hospitalized due to vomiting and significant weight loss.  It is unclear what caused the symptoms but it could be related to heart failure and atrial fibrillation.  Your symptoms improved.  We have started you on medication for heart failure and atrial fibrillation.  Please review your  new medication list and the directions on your medications before you take them.  Follow-up with your primary care doctor in 1 to 2 weeks or sooner if needed.  Follow-up with cardiology and gastroenterology per their recommendation.  In addition to taking your medications as prescribed, we also recommend you avoid alcohol or over-the-counter pain medication other than plain Tylenol, limit the amount of water/fluid you drink to less than 6 cups (1500 cc) a day,  limit your sodium (salt) intake to less than 2 g (2000 mg) a day and weigh yourself daily at the same time and keeping your weight log.      Take care,   Increase activity slowly   Complete by: As directed       Allergies as of  01/10/2023   No Known Allergies      Medication List     STOP taking these medications    amLODipine 5 MG tablet Commonly known as: NORVASC   hydrALAZINE 25 MG tablet Commonly known as: APRESOLINE   lisinopril 40 MG tablet Commonly known as: ZESTRIL       TAKE these medications    apixaban 5 MG Tabs tablet Commonly known as: ELIQUIS Take 1 tablet (5 mg total) by mouth 2 (two) times daily.   atorvastatin 20 MG tablet Commonly known as: Lipitor Take 1 tablet (20 mg total) by mouth daily.   carvedilol 6.25 MG tablet Commonly known as: COREG Take 1 tablet (6.25 mg total) by mouth 2 (two) times daily with a meal.   dapagliflozin propanediol 10 MG Tabs tablet Commonly known as: Farxiga Take 1 tablet (10 mg total) by mouth daily before breakfast.   furosemide 20 MG tablet Commonly known as: LASIX Take 1 tablet (20 mg total) by mouth daily. Start taking on: January 11, 2023 What changed:  medication strength how much to take   metFORMIN 500 MG tablet Commonly known as: GLUCOPHAGE Take 1 tablet (500 mg total) by mouth 2 (two) times daily with a meal.   multivitamin with minerals Tabs tablet Take 1 tablet by mouth daily. Start taking on: January 11, 2023   pantoprazole 40 MG tablet Commonly known as: PROTONIX Take 40 mg by mouth in the morning. Take 30 minutes before first meal for reflux and heartburn.   potassium chloride SA 20 MEQ tablet Commonly known as: KLOR-CON M Take 1 tablet (20 mEq total) by mouth daily. Start taking on: January 11, 2023   sacubitril-valsartan 24-26 MG Commonly known as: ENTRESTO Take 1 tablet by mouth 2 (two) times daily.   spironolactone 25 MG tablet Commonly known as: ALDACTONE Take 0.5 tablets (12.5 mg total) by mouth daily. Start taking on: January 11, 2023               Durable Medical Equipment  (From admission, onward)           Start     Ordered   01/09/23 1446  For home use only DME Walker rolling   Once       Question Answer Comment  Walker: With 5 Inch Wheels   Patient needs a walker to treat with the following condition Other abnormalities of gait and mobility      01/09/23 1446   01/09/23 1446  For home use only DME 3 n 1  Once        01/09/23 1446            Consultations: Gastroenterology Cardiology  Procedures/Studies: 2/6-EGD showed gastritis that was biopsied  and LA grade a esophagitis without bleeding 2/7-colonoscopy showed stool in entire examined colon, nonbleeding internal hemorrhoid and diverticulosis in the rectosigmoid colon and sigmoid colon   MR LIVER W WO CONTRAST  Result Date: 01/08/2023 CLINICAL DATA:  Liver lesions on CT scan for further characterization. EXAM: MRI ABDOMEN WITHOUT AND WITH CONTRAST TECHNIQUE: Multiplanar multisequence MR imaging of the abdomen was performed both before and after the administration of intravenous contrast. CONTRAST:  74m GADAVIST GADOBUTROL 1 MMOL/ML IV SOLN COMPARISON:  01/07/2023 FINDINGS: Despite efforts by the technologist and patient, motion artifact is present on today's exam and could not be eliminated. This reduces exam sensitivity and specificity. Lower chest: Mild cardiomegaly. Suspected atelectasis in the left lower lobe. Hepatobiliary: The more caudad lesion in segment 4 of the liver along the falciform ligament represents focal steatosis, as shown on image 43 of series 9 where there is dropout of signal. The more cephalad segment 4 lesion measures 2.4 by 1.6 by 2.4 cm and demonstrates accentuated sharply defined T2 signal as well as peripheral nodular centripetal enhancement and delayed enhancement compatible with a benign hepatic hemangioma. No further workup of this lesion is required. A 5 mm T2 hyperintense lesion in the lateral segment left hepatic lobe on image 18 series 7 demonstrates T1 hypointensity and subsequent fill in with contrast enhancement strongly favoring a small benign hepatic hemangioma. This lesion  is not felt to require further workup at this time. The gallbladder appears unremarkable. Sludge versus tiny layering gallstones in the gallbladder, for example on image 22 series 4. Pancreas:  Unremarkable Spleen:  Unremarkable Adrenals/Urinary Tract: 1.3 cm Bosniak category 1 cyst of the left kidney lower pole on image 59 of series 21, benign and warranting no further workup. Adrenal glands appear normal. The kidneys appear otherwise unremarkable. Stomach/Bowel: Unremarkable Vascular/Lymphatic:  Unremarkable Other:  No supplemental non-categorized findings. Musculoskeletal: Lower lumbar loss of disc height and degenerative endplate findings associated spondylosis and degenerative disc disease. On coronal images were observe a left paracentral umbilical hernia containing adipose tissues. IMPRESSION: 1. Both previous lesions of concern in the lateral segment left hepatic lobe are benign, 1 of these is a benign hemangioma and the other represents focal steatosis. There is also a tiny third lesion in the lateral segment left hepatic lobe most compatible with a small hemangioma as well. These liver lesions do not warrant further workup. 2. Sludge versus tiny layering gallstones in the gallbladder. 3. Mild cardiomegaly. 4. Lower lumbar spondylosis and degenerative disc disease. 5. 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 Electronically Signed   By: WVan ClinesM.D.   On: 01/08/2023 19:59   CT CHEST ABDOMEN PELVIS W CONTRAST  Result Date: 01/07/2023 CLINICAL DATA:  Metastatic disease evaluation. Unintentional weight loss with nausea. Patient is status post benign endoscopy. Status post colonoscopy today for which results are not available. EXAM: CT CHEST, ABDOMEN, AND PELVIS WITH CONTRAST TECHNIQUE: Multidetector CT imaging of the chest, abdomen and pelvis was performed following the standard protocol during bolus administration of intravenous contrast. RADIATION DOSE REDUCTION: This exam  was performed according to the departmental dose-optimization program which includes automated exposure control, adjustment of the mA and/or kV according to patient size and/or use of iterative reconstruction technique. CONTRAST:  1016mOMNIPAQUE IOHEXOL 300 MG/ML  SOLN COMPARISON:  None Available. FINDINGS: CT CHEST FINDINGS Cardiovascular: Cardiomegaly. No central pulmonary embolism. Thoracic aorta is normal in course and caliber. Mild enlargement of the main pulmonary artery in relation to the thoracic aorta. No pericardial effusion.  Three-vessel coronary artery atherosclerotic calcifications. Aortic valve calcifications. Mediastinum/Nodes: Visualized thyroid is unremarkable. No axillary or mediastinal adenopathy. Lungs/Pleura: No pleural effusion or pneumothorax. Small amount of debris within the trachea. Mild centrilobular ground-glass opacities at the lung bases, likely the sequela of aspiration. No suspicious pulmonary masses. 2 mm LEFT lower lobe pulmonary nodule, likely infectious or inflammatory (series 4, image 105). Musculoskeletal: No acute osseous abnormality. CT ABDOMEN PELVIS FINDINGS Hepatobiliary: Hepatic steatosis. In the hepatic segment 4, there is a hypodense mass which measures approximately 23 mm (series 2, image 52). Additional 17 mm hypodensity adjacent to the falciform ligament may reflect focal fatty deposition but is somewhat expansile in appearance (series 2, image 59). Gallbladder is unremarkable. No intrahepatic or extrahepatic biliary ductal dilation. Pancreas: Unremarkable. No pancreatic ductal dilatation or surrounding inflammatory changes. Spleen: Normal in size without focal abnormality. Adrenals/Urinary Tract: Adrenal glands are unremarkable. Kidneys enhance symmetrically. No hydronephrosis. Subcentimeter hypodense lesions are too small to accurately characterize, but statistically likely cysts. Benign cyst of the inferior pole of the LEFT kidney (for which no dedicated imaging  follow-up is recommended). Bladder is decompressed. Stomach/Bowel: No evidence of bowel obstruction. Scattered diverticulosis without evidence of acute diverticulitis. Appendix is normal. Stomach is unremarkable. Vascular/Lymphatic: Mild atherosclerotic calcifications of the nonaneurysmal abdominal aorta. No suspicious lymphadenopathy. Reproductive: Fibroid uterus. Endometrium is mildly prominent in a postmenopausal setting measuring an estimated 9 mm. Symmetric adnexa. Other: Small fat containing periumbilical hernia. No free air or free fluid. Musculoskeletal: Degenerative changes of the lumbar spine. IMPRESSION: 1. There are 2 indeterminate hypodense hepatic lesions. Recommend further evaluation with dedicated liver MRI with and without contrast. 2. Fibroid uterus. Endometrium is mildly prominent in a postmenopausal setting measuring an estimated 9 mm. Recommend correlation with pelvic ultrasound. 3. Mild enlargement of the main pulmonary artery in relation to the thoracic aorta. This can be seen in the setting of pulmonary arterial hypertension. 4. Hepatic steatosis. 5. Mild centrilobular ground-glass opacities at the lung bases, likely the sequela of aspiration. 6. 2 mm LEFT lower lobe pulmonary nodule, likely infectious or inflammatory. No follow-up needed if patient is low-risk.This recommendation follows the consensus statement: Guidelines for Management of Incidental Pulmonary Nodules Detected on CT Images: From the Fleischner Society 2017; Radiology 2017; 284:228-243. Aortic Atherosclerosis (ICD10-I70.0). Electronically Signed   By: Valentino Saxon M.D.   On: 01/07/2023 18:37   ECHOCARDIOGRAM COMPLETE  Result Date: 01/06/2023    ECHOCARDIOGRAM REPORT   Patient Name:   BRITTANNI RULISON [facility] Date of Exam: 01/06/2023 Medical Rec #:  [id number]             Height:       66.0 in Accession #:    [id number]            Weight:       208.3 lb Date of Birth:  09-Nov-1966            BSA:          2.035 m Patient  Age:    80 years              BP:           152/100 mmHg Patient Gender: F                     HR:           83 bpm. Exam Location:  [facility] Procedure: 2D Echo, Cardiac Doppler and Color Doppler Indications:     CHF  History:  Patient has prior history of Echocardiogram examinations, most                  recent 11/13/2019. CHF; Risk Factors:Hypertension and Diabetes.  Sonographer:     Wenda Low Referring Phys:  [id number] Athena Masse Diagnosing Phys: Kathlyn Sacramento MD  Sonographer Comments: Patient is obese. IMPRESSIONS  1. Left ventricular ejection fraction, by estimation, is 35 to 40%. The left ventricle has moderately decreased function. The left ventricle demonstrates global hypokinesis. The left ventricular internal cavity size was moderately dilated. There is mild  left ventricular hypertrophy. Left ventricular diastolic parameters are indeterminate.  2. Right ventricular systolic function is normal. The right ventricular size is normal. Tricuspid regurgitation signal is inadequate for assessing PA pressure.  3. Left atrial size was moderately dilated.  4. Right atrial size was mildly dilated.  5. The mitral valve is normal in structure. Mild to moderate mitral valve regurgitation. No evidence of mitral stenosis.  6. The aortic valve is normal in structure. Aortic valve regurgitation is mild to moderate. Aortic valve sclerosis/calcification is present, without any evidence of aortic stenosis.  7. Pulmonic valve regurgitation is moderate.  8. Aortic dilatation noted. There is mild dilatation of the aortic root, measuring 39 mm.  9. The inferior vena cava is normal in size with greater than 50% respiratory variability, suggesting right atrial pressure of 3 mmHg. FINDINGS  Left Ventricle: Left ventricular ejection fraction, by estimation, is 35 to 40%. The left ventricle has moderately decreased function. The left ventricle demonstrates global hypokinesis. The left ventricular internal cavity size was  moderately dilated. There is mild left ventricular hypertrophy. Left ventricular diastolic parameters are indeterminate. Right Ventricle: The right ventricular size is normal. No increase in right ventricular wall thickness. Right ventricular systolic function is normal. Tricuspid regurgitation signal is inadequate for assessing PA pressure. Left Atrium: Left atrial size was moderately dilated. Right Atrium: Right atrial size was mildly dilated. Pericardium: There is no evidence of pericardial effusion. Mitral Valve: The mitral valve is normal in structure. Mild mitral annular calcification. Mild to moderate mitral valve regurgitation. No evidence of mitral valve stenosis. MV peak gradient, 2.7 mmHg. The mean mitral valve gradient is 1.0 mmHg. Tricuspid Valve: The tricuspid valve is normal in structure. Tricuspid valve regurgitation is trivial. No evidence of tricuspid stenosis. Aortic Valve: The aortic valve is normal in structure. Aortic valve regurgitation is mild to moderate. Aortic regurgitation PHT measures 918 msec. Aortic valve sclerosis/calcification is present, without any evidence of aortic stenosis. Aortic valve mean  gradient measures 4.0 mmHg. Aortic valve peak gradient measures 9.6 mmHg. Aortic valve area, by VTI measures 2.71 cm. Pulmonic Valve: The pulmonic valve was normal in structure. Pulmonic valve regurgitation is moderate. No evidence of pulmonic stenosis. Aorta: Aortic dilatation noted. There is mild dilatation of the aortic root, measuring 39 mm. Venous: The inferior vena cava is normal in size with greater than 50% respiratory variability, suggesting right atrial pressure of 3 mmHg. IAS/Shunts: There is right bowing of the interatrial septum, suggestive of elevated left atrial pressure. No atrial level shunt detected by color flow Doppler.  LEFT VENTRICLE PLAX 2D LVIDd:         6.40 cm   Diastology LVIDs:         5.40 cm   LV e' medial:    6.09 cm/s LV PW:         1.60 cm   LV E/e' medial:   9.8 LV IVS:  1.30 cm   LV e' lateral:   5.44 cm/s LVOT diam:     2.20 cm   LV E/e' lateral: 11.0 LV SV:         64 LV SV Index:   31 LVOT Area:     3.80 cm  RIGHT VENTRICLE RV Basal diam:  4.10 cm RV Mid diam:    3.90 cm RV S prime:     7.51 cm/s TAPSE (M-mode): 2.2 cm LEFT ATRIUM              Index        RIGHT ATRIUM           Index LA diam:        3.90 cm  1.92 cm/m   RA Area:     23.20 cm LA Vol (A2C):   125.0 ml 61.44 ml/m  RA Volume:   72.60 ml  35.68 ml/m LA Vol (A4C):   67.2 ml  33.03 ml/m LA Biplane Vol: 102.0 ml 50.13 ml/m  AORTIC VALVE                    PULMONIC VALVE AV Area (Vmax):    3.21 cm     PV Vmax:       1.00 m/s AV Area (Vmean):   3.19 cm     PV Peak grad:  4.0 mmHg AV Area (VTI):     2.71 cm AV Vmax:           155.00 cm/s AV Vmean:          86.600 cm/s AV VTI:            0.236 m AV Peak Grad:      9.6 mmHg AV Mean Grad:      4.0 mmHg LVOT Vmax:         131.00 cm/s LVOT Vmean:        72.600 cm/s LVOT VTI:          0.168 m LVOT/AV VTI ratio: 0.71 AI PHT:            918 msec  AORTA Ao Root diam: 3.90 cm Ao Asc diam:  3.10 cm MITRAL VALVE MV Area (PHT): 4.74 cm    SHUNTS MV Area VTI:   3.16 cm    Systemic VTI:  0.17 m MV Peak grad:  2.7 mmHg    Systemic Diam: 2.20 cm MV Mean grad:  1.0 mmHg MV Vmax:       0.83 m/s MV Vmean:      51.3 cm/s MV Decel Time: 160 msec MV E velocity: 59.90 cm/s Kathlyn Sacramento MD Electronically signed by Kathlyn Sacramento MD Signature Date/Time: 01/06/2023/10:52:26 AM    Final    DG Abdomen Acute W/Chest  Result Date: 01/06/2023 CLINICAL DATA:  Vomiting EXAM: DG ABDOMEN ACUTE WITH 1 VIEW CHEST COMPARISON:  Chest x-ray 11/13/2019 FINDINGS: There is no evidence of dilated bowel loops or free intraperitoneal air. No radiopaque calculi or other significant radiographic abnormality is seen. Heart size and mediastinal contours are within normal limits. Both lungs are clear. IMPRESSION: Negative abdominal radiographs.  No acute cardiopulmonary disease.  Electronically Signed   By: Rolm Baptise M.D.   On: 01/06/2023 02:05       The results of significant diagnostics from this hospitalization (including imaging, microbiology, ancillary and laboratory) are listed below for reference.     Microbiology: Recent Results (from the past 240 hour(s))  Urine Culture (for pregnant, neutropenic  or urologic patients or patients with an indwelling urinary catheter)     Status: Abnormal   Collection Time: 01/06/23 12:35 AM   Specimen: Urine, Clean Catch  Result Value Ref Range Status   Specimen Description   Final    URINE, CLEAN CATCH Performed at [facility], [address] [place], [place] [address]    Special Requests   Final    NONE Performed at [facility], [address], [place], [place] [address]    Culture   Final    Two isolates with different morphologies were identified as the same organism.The most resistant organism was reported. >=100,000 COLONIES/mL ESCHERICHIA COLI >=100,000 COLONIES/mL AEROCOCCUS URINAE Standardized susceptibility testing for this organism is not available. Performed at [facility], [place] [address], [place], [place] [address]    Report Status 01/10/2023 FINAL  Final   Organism ID, Bacteria ESCHERICHIA COLI (A)  Final      Susceptibility   Escherichia coli - MIC*    AMPICILLIN >=32 RESISTANT Resistant     CEFAZOLIN <=4 SENSITIVE Sensitive     CEFEPIME <=0.12 SENSITIVE Sensitive     CEFTRIAXONE <=0.25 SENSITIVE Sensitive     CIPROFLOXACIN <=0.25 SENSITIVE Sensitive     GENTAMICIN <=1 SENSITIVE Sensitive     IMIPENEM <=0.25 SENSITIVE Sensitive     NITROFURANTOIN <=16 SENSITIVE Sensitive     TRIMETH/SULFA >=320 RESISTANT Resistant     AMPICILLIN/SULBACTAM >=32 RESISTANT Resistant     PIP/TAZO <=4 SENSITIVE Sensitive     * >=100,000 COLONIES/mL ESCHERICHIA COLI  Resp Panel by RT-PCR (Flu A&B, Covid) Anterior Nasal Swab     Status: None   Collection Time: 01/06/23  1:17 AM    Specimen: Anterior Nasal Swab  Result Value Ref Range Status   SARS Coronavirus 2 by RT PCR NEGATIVE NEGATIVE Final    Comment: (NOTE) SARS-CoV-2 target nucleic acids are NOT DETECTED.  The SARS-CoV-2 RNA is generally detectable in upper respiratory specimens during the acute phase of infection. The lowest concentration of SARS-CoV-2 viral copies this assay can detect is 138 copies/mL. A negative result does not preclude SARS-Cov-2 infection and should not be used as the sole basis for treatment or other patient management decisions. A negative result may occur with  improper specimen collection/handling, submission of specimen other than nasopharyngeal swab, presence of viral mutation(s) within the areas targeted by this assay, and inadequate number of viral copies(<138 copies/mL). A negative result must be combined with clinical observations, patient history, and epidemiological information. The expected result is Negative.  Fact Sheet for Patients:  EntrepreneurPulse.com.au  Fact Sheet for Healthcare Providers:  IncredibleEmployment.be  This test is no t yet approved or cleared by the [place] FDA and  has been authorized for detection and/or diagnosis of SARS-CoV-2 by FDA under an Emergency Use Authorization (EUA). This EUA will remain  in effect (meaning this test can be used) for the duration of the COVID-19 declaration under Section 564(b)(1) of the Act, 21 U.S.C.section 360bbb-3(b)(1), unless the authorization is terminated  or revoked sooner.       Influenza A by PCR NEGATIVE NEGATIVE Final   Influenza B by PCR NEGATIVE NEGATIVE Final    Comment: (NOTE) The Xpert Xpress SARS-CoV-2/FLU/RSV plus assay is intended as an aid in the diagnosis of influenza from Nasopharyngeal swab specimens and should not be used as a sole basis for treatment. Nasal washings and aspirates are unacceptable for Xpert Xpress  SARS-CoV-2/FLU/RSV testing.  Fact Sheet for Patients: EntrepreneurPulse.com.au  Fact Sheet for Healthcare Providers: IncredibleEmployment.be  This test is not yet approved or cleared by the [place] FDA and has been authorized for detection and/or diagnosis of SARS-CoV-2 by FDA under an Emergency Use Authorization (EUA). This EUA will remain in effect (meaning this test can be used) for the duration of the COVID-19 declaration under Section 564(b)(1) of the Act, 21 U.S.C. section 360bbb-3(b)(1), unless the authorization is terminated or revoked.  Performed at [facility], [address] [place], [place] [address]      Labs:  CBC: Recent Labs  Lab 01/06/23 [phone] 01/07/23 0428 01/08/23 0643 01/09/23 0100 01/10/23 0524 01/10/23 0526  WBC 4.2 5.6 3.0* 4.3 2.5* 2.6*  NEUTROABS 2.7  --   --   --  0.8*  --   HGB 16.7* 14.9 14.2 12.7 12.6 12.8  HCT 52.9* 46.1* 44.3 39.9 38.7 38.6  MCV 79.7* 80.3 79.5* 80.3 78.5* 78.3*  PLT 196 141* 139* 115* 136* 135*   BMP &GFR Recent Labs  Lab 01/06/23 0034 01/06/23 0035 01/06/23 1606 01/07/23 0556 01/08/23 0643 01/09/23 0100 01/09/23 0101 01/10/23 0526  NA  --    < > 132* 135 134* 135  --  131*  K  --    < > 5.0 4.3 3.6 3.6  --  3.1*  CL  --    < > 96* 98 101 103  --  103  CO2  --    < > 24 25 23 24  $ --  21*  GLUCOSE  --    < > 163* 82 104* 129*  --  96  BUN  --    < > 22* 19 20 17  $ --  15  CREATININE  --    < > 1.11* 0.97 0.96 0.88  --  0.80  CALCIUM  --    < > 9.7 9.7 9.4 9.1  --  8.9  MG 2.4  --   --   --   --  1.6*  --  2.1  PHOS  --   --   --   --   --   --  2.4* 2.4*   < > = values in this interval not displayed.   Estimated Creatinine Clearance: 89.9 mL/min (by C-G formula based on SCr of 0.8 mg/dL). Liver & Pancreas: Recent Labs  Lab 01/06/23 0035 01/09/23 0100 01/10/23 0526  AST 26 14* 18  ALT 16 10 11  $ ALKPHOS 53 36* 39  BILITOT 1.5* 1.0 1.0  PROT 8.3*  5.9* 5.7*  ALBUMIN 3.4* 2.6* 2.5*   No results for input(s): "LIPASE", "AMYLASE" in the last 168 hours. No results for input(s): "AMMONIA" in the last 168 hours. Diabetic: No results for input(s): "HGBA1C" in the last 72 hours. Recent Labs  Lab 01/10/23 0047 01/10/23 0321 01/10/23 0439 01/10/23 0809 01/10/23 1216  GLUCAP 90 111* 98 95 235*   Cardiac Enzymes: Recent Labs  Lab 01/06/23 0035  CKTOTAL 22*   No results for input(s): "PROBNP" in the last 8760 hours. Coagulation Profile: Recent Labs  Lab 01/06/23 1606  INR 1.2   Thyroid Function Tests: No results for input(s): "TSH", "T4TOTAL", "FREET4", "T3FREE", "THYROIDAB" in the last 72 hours. Lipid Profile: No results for input(s): "CHOL", "HDL", "LDLCALC", "TRIG", "CHOLHDL", "LDLDIRECT" in the last 72 hours. Anemia Panel: Recent Labs    01/09/23 0100  VITAMINB12 709  FOLATE 8.7  FERRITIN 240  TIBC 186*  IRON 44  RETICCTPCT 0.7   Urine analysis:  Component Value Date/Time   COLORURINE YELLOW (A) 01/06/2023 0034   APPEARANCEUR TURBID (A) 01/06/2023 0034   APPEARANCEUR Cloudy 08/10/2013 1229   LABSPEC 1.019 01/06/2023 0034   LABSPEC 1.024 08/10/2013 1229   PHURINE 5.0 01/06/2023 0034   GLUCOSEU NEGATIVE 01/06/2023 0034   GLUCOSEU Negative 08/10/2013 1229   HGBUR SMALL (A) 01/06/2023 0034   BILIRUBINUR NEGATIVE 01/06/2023 0034   BILIRUBINUR Negative 08/10/2013 1229   KETONESUR 5 (A) 01/06/2023 0034   PROTEINUR 100 (A) 01/06/2023 0034   UROBILINOGEN 0.2 09/06/2010 1027   NITRITE NEGATIVE 01/06/2023 0034   LEUKOCYTESUR MODERATE (A) 01/06/2023 0034   LEUKOCYTESUR 3+ 08/10/2013 1229   Sepsis Labs: Invalid input(s): "PROCALCITONIN", "LACTICIDVEN"   SIGNED:  Mercy Riding, MD  [facility] 01/10/2023, 4:22 PM

## 2023-01-10 NOTE — Telephone Encounter (Signed)
Patient Advocate Encounter   Received notification that prior authorization for Farxiga 10 mg is required.   PA submitted on 01/10/2023 Prior Authorization Request [id number] [employer]  Medicaid Status is pending       Lyndel Safe, [place] Patient Advocate Specialist [place] Patient Advocate Team Direct Number: [phone]  Fax: [phone]

## 2023-01-10 NOTE — Anesthesia Postprocedure Evaluation (Signed)
Anesthesia Post Note  Patient: [name]  Procedure(s) Performed: COLONOSCOPY WITH PROPOFOL  Patient location during evaluation: PACU Anesthesia Type: General Level of consciousness: sedated Pain management: satisfactory to patient Vital Signs Assessment: vitals unstable Respiratory status: nonlabored ventilation Cardiovascular status: unstable Anesthetic complications: no  No notable events documented.   Last Vitals:  Vitals:   01/10/23 0013 01/10/23 0315  BP: 111/88 (!) 130/98  Pulse: 67 71  Resp: 18 20  Temp: 36.5 C 36.4 C  SpO2: 94% 100%    Last Pain:  Vitals:   01/10/23 0315  TempSrc: Oral  PainSc:                  VAN STAVEREN,[name]

## 2023-01-10 NOTE — Progress Notes (Signed)
Physical Therapy Treatment Patient Details Name: [name] MRN: [id number] DOB: 1966-03-04 Today's Date: 01/10/2023   History of Present Illness Pt is a 57 y.o. female presenting to hospital 01/06/23 with c/o difficulty eating; lost over 25 pounds in last 45 days.  Pt admitted with intractable vomiting, dysphagia, lactic acidosis, UTI, AKI, unintentional weight loss, hypercalcemia, hypertensive urgency, and elevated troponin (likely d/t demand ischemia per notes), and new onset a-fib.  S/p upper GI endoscopy 2/6 and s/p colonoscopy 2/7.  PMH includes CHF, DM, htn.    PT Comments    Pt received in chair agreeable to PT interventions. Pt reported of dizziness at the end of the ambulation which began  to resolve with sitting.  Pt transferred with sup and FWW. Ambulated one lap wound the Unit with 3 standing rest breaks as pt needed with min guard. Pt Plans to return home with Son who will pick her up when discharged. Pt balance is good but needs one person sup 2/2 weakness and dizziness with exertion. HR 8- tp 90s though out the session. Pt will benefit of HHPT as pt has 4 STE her home and general deconditioning due to unknown cause of Weight loss. Pt agrees.  T will continue in acute care.   Recommendations for follow up therapy are one component of a multi-disciplinary discharge planning process, led by the attending physician.  Recommendations may be updated based on patient status, additional functional criteria and insurance authorization.  Follow Up Recommendations  Home health PT     Assistance Recommended at Discharge Set up Supervision/Assistance  Patient can return home with the following A little help with walking and/or transfers;A little help with bathing/dressing/bathroom;Assistance with cooking/housework;Help with stairs or ramp for entrance;Assist for transportation   Equipment Recommendations  Rolling walker (2 wheels);BSC/3in1    Recommendations for Other Services        Precautions / Restrictions Precautions Precautions: Fall Restrictions Weight Bearing Restrictions: No     Mobility  Bed Mobility Overal bed mobility: Needs Assistance             General bed mobility comments: received in chair    Transfers Overall transfer level: Needs assistance Equipment used: Rolling walker (2 wheels) Transfers: Sit to/from Stand Sit to Stand: Supervision                Ambulation/Gait Ambulation/Gait assistance: Min guard Gait Distance (Feet): 160 Feet Assistive device: Rolling walker (2 wheels) Gait Pattern/deviations: Step-through pattern, Decreased stride length Gait velocity: decreased     General Gait Details: 3 standin rest breaks.   Stairs: will attempt tomorrow AM             Wheelchair Mobility    Modified Rankin (Stroke Patients Only)       Balance Overall balance assessment: Needs assistance Sitting-balance support: No upper extremity supported, Feet supported Sitting balance-Leahy Scale: Normal     Standing balance support: Bilateral upper extremity supported Standing balance-Leahy Scale: Good Standing balance comment: steady ambulating with RW use                            Cognition Arousal/Alertness: Awake/alert Behavior During Therapy: WFL for tasks assessed/performed Overall Cognitive Status: Within Functional Limits for tasks assessed  Exercises General Exercises - Lower Extremity Ankle Circles/Pumps: AROM, Both, 10 reps Long Arc Quad: AROM, Both, 10 reps, Seated Hip Flexion/Marching: AROM, Both, 10 reps    General Comments        Pertinent Vitals/Pain Pain Assessment Pain Assessment: No/denies pain    Home Living                          Prior Function            PT Goals (current goals can now be found in the care plan section) Acute Rehab PT Goals Patient Stated Goal: to improve strength and  mobility PT Goal Formulation: With patient Time For Goal Achievement: 01/23/23 Potential to Achieve Goals: Good Progress towards PT goals: Progressing toward goals    Frequency    Min 2X/week      PT Plan Discharge plan needs to be updated (HHPT)    Co-evaluation              AM-PAC PT "6 Clicks" Mobility   Outcome Measure  Help needed turning from your back to your side while in a flat bed without using bedrails?: None Help needed moving from lying on your back to sitting on the side of a flat bed without using bedrails?: None Help needed moving to and from a bed to a chair (including a wheelchair)?: A Little Help needed standing up from a chair using your arms (e.g., wheelchair or bedside chair)?: A Little Help needed to walk in hospital room?: A Little Help needed climbing 3-5 steps with a railing? : A Little 6 Click Score: 20    End of Session Equipment Utilized During Treatment: Gait belt Activity Tolerance: Patient limited by fatigue;Other (comment) Patient left: in chair;with call bell/phone within reach;with chair alarm set Nurse Communication: Mobility status PT Visit Diagnosis: Other abnormalities of gait and mobility (R26.89);Muscle weakness (generalized) (M62.81)     Time: [id number] PT Time Calculation (min) (ACUTE ONLY): 16 min  Charges:  $Gait Training: 8-22 mins          Dru Primeau PT DPT 1:12 PM,01/10/23

## 2023-01-11 LAB — CBC WITH DIFFERENTIAL/PLATELET
Abs Immature Granulocytes: 0.01 10*3/uL (ref 0.00–0.07)
Basophils Absolute: 0.1 10*3/uL (ref 0.0–0.1)
Basophils Relative: 2 %
Eosinophils Absolute: 0.1 10*3/uL (ref 0.0–0.5)
Eosinophils Relative: 4 %
HCT: 40.5 % (ref 36.0–46.0)
Hemoglobin: 13.4 g/dL (ref 12.0–15.0)
Immature Granulocytes: 0 %
Lymphocytes Relative: 37 %
Lymphs Abs: 1.2 10*3/uL (ref 0.7–4.0)
MCH: 26 pg (ref 26.0–34.0)
MCHC: 33.1 g/dL (ref 30.0–36.0)
MCV: 78.5 fL — ABNORMAL LOW (ref 80.0–100.0)
Monocytes Absolute: 0.4 10*3/uL (ref 0.1–1.0)
Monocytes Relative: 13 %
Neutro Abs: 1.4 10*3/uL — ABNORMAL LOW (ref 1.7–7.7)
Neutrophils Relative %: 44 %
Platelets: 125 10*3/uL — ABNORMAL LOW (ref 150–400)
RBC: 5.16 MIL/uL — ABNORMAL HIGH (ref 3.87–5.11)
RDW: 17.6 % — ABNORMAL HIGH (ref 11.5–15.5)
WBC: 3.3 10*3/uL — ABNORMAL LOW (ref 4.0–10.5)
nRBC: 0.6 % — ABNORMAL HIGH (ref 0.0–0.2)

## 2023-01-11 LAB — RENAL FUNCTION PANEL
Albumin: 2.4 g/dL — ABNORMAL LOW (ref 3.5–5.0)
Anion gap: 6 (ref 5–15)
BUN: 16 mg/dL (ref 6–20)
CO2: 24 mmol/L (ref 22–32)
Calcium: 9.2 mg/dL (ref 8.9–10.3)
Chloride: 102 mmol/L (ref 98–111)
Creatinine, Ser: 0.88 mg/dL (ref 0.44–1.00)
GFR, Estimated: >60 mL/min (ref >60)
Glucose, Bld: 150 mg/dL — ABNORMAL HIGH (ref 70–99)
Phosphorus: 2 mg/dL — ABNORMAL LOW (ref 2.5–4.6)
Potassium: 3.9 mmol/L (ref 3.5–5.1)
Sodium: 132 mmol/L — ABNORMAL LOW (ref 135–145)

## 2023-01-11 LAB — GLUCOSE, CAPILLARY
Glucose-Capillary: 154 mg/dL — ABNORMAL HIGH (ref 70–99)
Glucose-Capillary: 160 mg/dL — ABNORMAL HIGH (ref 70–99)
Glucose-Capillary: 219 mg/dL — ABNORMAL HIGH (ref 70–99)
Glucose-Capillary: 243 mg/dL — ABNORMAL HIGH (ref 70–99)

## 2023-01-11 LAB — BRAIN NATRIURETIC PEPTIDE: B Natriuretic Peptide: 674.6 pg/mL — ABNORMAL HIGH (ref 0.0–100.0)

## 2023-01-11 LAB — MAGNESIUM: Magnesium: 2 mg/dL (ref 1.7–2.4)

## 2023-01-11 NOTE — Progress Notes (Signed)
patient was briefly seen this morning. Chart reviewed. She was all set for discharge yesterday but she did not have a ride yesterday and her family is picking up today. No overnight event. No changes needed to the discharge plan in summary. [facility] will not bill for today

## 2023-01-11 NOTE — Progress Notes (Signed)
Mobility Specialist - Progress Note     01/11/23 1000  Mobility  Activity Ambulated with assistance in hallway;Stood at bedside;Moved into chair position in bed  Level of Assistance Modified independent, requires aide device or extra time  Assistive Device Front wheel walker  Distance Ambulated (ft) 160 ft  Activity Response Tolerated well  Mobility Referral Yes  $Mobility charge 1 Mobility   Pt resting in recliner on RA upon entry. Pt STS and ambulates to hallway ModI around NS for 1 lap. Pt returned to recliner and left with needs in reach and chair alarm activated.   Loma Sender Mobility Specialist 01/11/23, 10:31 AM

## 2023-01-11 NOTE — Progress Notes (Signed)
Occupational Therapy * Physical Therapy * Speech Therapy  DATE: 01/11/23 PATIENT NAME: [name]  PATIENT MRN: [id number]  DIAGNOSIS/DIAGNOSIS CODE: R11.10 DATE OF DISCHARGE: 01/11/23  PRIMARY CARE PHYSICIAN: Renette Butters  PCP PHONE/FAX: Phone:  [phone]  Dear Provider at [place] Fax: [phone] Phone: [phone]   I certify that I have examined this patient and that occupational/physical/speech therapy is necessary on an outpatient basis.    The patient has expressed interest in completing their recommended course of therapy at your location.  Once a formal order from the patient's primary care physician has been obtained, please contact him/her to schedule an appointment for evaluation at your earliest convenience.  [ x ]  Physical Therapy Evaluate and Treat  [ x ]  Occupational Therapy Evaluate and Treat  [  ]  Speech Therapy Evaluate and Treat  The patient's primary care physician (listed above) must furnish and be responsible for a formal order such that the recommended services may be furnished while under the primary physician's care, and that the plan of care will be established and reviewed every 30 days (or more often if condition necessitates).

## 2023-01-11 NOTE — Progress Notes (Signed)
Mobility Specialist - Progress Note     01/11/23 1027  Mobility  Activity Ambulated with assistance to bathroom;Stood at bedside  Level of Assistance Modified independent, requires aide device or extra time  Assistive Device Front wheel walker  Distance Ambulated (ft) 10 ft  Activity Response Tolerated well  Mobility Referral Yes  $Mobility charge 1 Mobility   Pt resting in recliner on RA upon entry. Pt ambulates to bathroom ModI with RW. Pt return to recliner and left with needs in reach and chair alarm activated.   Loma Sender Mobility Specialist 01/11/23, 10:32 AM

## 2023-01-11 NOTE — TOC Transition Note (Signed)
Transition of Care [facility]) - CM/SW Discharge Note   Patient Details  Name: [name] MRN: [id number] Date of Birth: October 27, 1966  Transition of Care [facility]) CM/SW Contact:  Izola Price, RN Phone Number: 01/11/2023, 2:39 PM   Clinical Narrative: 2/10: Discharge today. Unable to secure [facility] services but after speaking with patient, she is able to use established Medicaid Transport to get to outpatient therapy and chose [facility] outpatient rehab services. Progress note/order placed and signed. Son to transport to home. DME of 3:1 and RW delivered via [employer].  Simmie Davies RN CM      Final next level of care: OP Rehab Barriers to Discharge: Barriers Resolved   Patient Goals and CMS Choice      Discharge Placement                         Discharge Plan and Services Additional resources added to the After Visit Summary for       Post Acute Care Choice: NA          DME Arranged: 3-N-1, Walker rolling DME Agency: [employer] Date DME Agency Contacted: 01/11/23 Time DME Agency Contacted: [id number] Representative spoke with at DME Agency: [place]:  (Outpatient therapy.) [place] Agency: NA Date HH Agency Contacted: 01/11/23 Time [place]: [id number] Representative spoke with at [place]: [place] ([place]) Interventions [place]: No Food Insecurity (01/06/2023)  Housing: Low Risk  (01/06/2023)  Transportation Needs: Unmet Transportation Needs (01/06/2023)  Utilities: Not At Risk (01/06/2023)  Tobacco Use: Low Risk  (01/09/2023)     Readmission Risk Interventions     No data to display

## 2023-01-11 NOTE — Progress Notes (Signed)
Patient is not able to walk the distance required to go the bathroom, or he/she is unable to safely negotiate stairs required to access the bathroom.  A BSC will alleviate this problem.

## 2023-01-13 ENCOUNTER — Other Ambulatory Visit (HOSPITAL_COMMUNITY): Payer: Self-pay

## 2023-01-14 ENCOUNTER — Encounter: Payer: Self-pay | Admitting: Cardiology

## 2023-01-14 ENCOUNTER — Telehealth: Payer: Self-pay | Admitting: Cardiology

## 2023-01-14 ENCOUNTER — Telehealth: Payer: Self-pay | Admitting: *Deleted

## 2023-01-14 NOTE — Telephone Encounter (Signed)
Unable to contact patient due to bad number. Sent Letter.

## 2023-01-14 NOTE — Telephone Encounter (Signed)
Called patient, not a working number.

## 2023-01-14 NOTE — Telephone Encounter (Signed)
To [place] scheduling team to assist with this follow up appointment.

## 2023-01-14 NOTE — Telephone Encounter (Signed)
-----   Message from [place], PA-C sent at 01/11/2023  9:07 AM EST ----- Regarding: hosp follow-up Pt needs hospital follow-up in 3-4 weeks. thanks

## 2023-01-14 NOTE — Telephone Encounter (Signed)
-----   Message from Cadence H Furth, PA-C sent at 01/11/2023  9:07 AM EST ----- Regarding: hosp follow-up Pt needs hospital follow-up in 3-4 weeks. thanks  

## 2023-01-16 NOTE — Telephone Encounter (Signed)
Call could not be completed.

## 2023-01-21 ENCOUNTER — Ambulatory Visit: Payer: Medicaid Other | Attending: Medical | Admitting: Medical

## 2023-01-21 ENCOUNTER — Encounter: Payer: Self-pay | Admitting: *Deleted

## 2023-01-21 ENCOUNTER — Encounter: Payer: Self-pay | Admitting: Medical

## 2023-01-21 VITALS — BP 150/98 | HR 84 | Ht 67.0 in | Wt 221.0 lb

## 2023-01-21 DIAGNOSIS — R600 Localized edema: Secondary | ICD-10-CM | POA: Diagnosis not present

## 2023-01-21 DIAGNOSIS — I5032 Chronic diastolic (congestive) heart failure: Secondary | ICD-10-CM | POA: Diagnosis not present

## 2023-01-21 DIAGNOSIS — I4819 Other persistent atrial fibrillation: Secondary | ICD-10-CM

## 2023-01-21 DIAGNOSIS — R7989 Other specified abnormal findings of blood chemistry: Secondary | ICD-10-CM

## 2023-01-21 DIAGNOSIS — I10 Essential (primary) hypertension: Secondary | ICD-10-CM

## 2023-01-21 MED ORDER — FUROSEMIDE 20 MG PO TABS: 20.0000 mg | ORAL_TABLET | Freq: Every day | ORAL | 1 refills | Status: DC

## 2023-01-21 MED ORDER — METOPROLOL TARTRATE 100 MG PO TABS: 100.0000 mg | ORAL_TABLET | Freq: Once | ORAL | 0 refills | Status: AC

## 2023-01-21 NOTE — Patient Instructions (Signed)
Medication Instructions:  Your physician recommends that you continue on your current medications as directed. Please refer to the Current Medication list given to you today.  *If you need a refill on your cardiac medications before your next appointment, please call your pharmacy*   Lab Work: Your physician recommends that you get lab work today: BMET and CBC If you have labs (blood work) drawn today and your tests are completely normal, you will receive your results only by: MyChart Message (if you have [place]) OR A paper copy in the mail If you have any lab test that is abnormal or we need to change your treatment, we will call you to review the results.   Testing/Procedures: Your physician has recommended that you have a Cardioversion (DCCV). Electrical Cardioversion uses a jolt of electricity to your heart either through paddles or wired patches attached to your chest. This is a controlled, usually prescheduled, procedure. Defibrillation is done under light anesthesia in the hospital, and you usually go home the day of the procedure. This is done to get your heart back into a normal rhythm. You are not awake for the procedure. Please see the instruction sheet given to you today.   You are scheduled for a Cardioversion on Tuesday 02/18/23 with Dr. Saunders Revel Please arrive at the [place] [facility] at 6:30 a.m. on the day of your procedure.  DIET INSTRUCTIONS:  Nothing to eat or drink after midnight except your medications with a sip of water.     Labs: CBC & BMET  Medications:  YOU MAY TAKE ALL EXCEPT furosemide (LASIX) 20 MG and spironolactone (ALDACTONE) 25 MG tablet of your remaining medications with a small amount of water.  Must have a responsible person to drive you home.  Bring a current list of your medications and current insurance cards.    If you have any questions after you get home, please call the office at [phone]   Your physician has requested that you have cardiac  CT. Cardiac computed tomography (CT) is a painless test that uses an x-ray machine to take clear, detailed pictures of your heart. For further information please visit [email or web address]. Please follow instruction sheet as given.    Your cardiac CT will be scheduled at one of the below locations:   [facility] [address] [place], [place] [address] [phone]   If scheduled at [facility] or [facility], please arrive 15 mins early for check-in and test prep.   Please follow these instructions carefully (unless otherwise directed):   On the Night Before the Test: Be sure to Drink plenty of water. Do not consume any caffeinated/decaffeinated beverages or chocolate 12 hours prior to your test. Do not take any antihistamines 12 hours prior to your test.   On the Day of the Test: Drink plenty of water until 1 hour prior to the test. Do not eat any food 1 hour prior to test. You may take your regular medications prior to the test.  Take metoprolol (Lopressor) two hours prior to test. If you take Furosemide/Hydrochlorothiazide/Spironolactone, please HOLD on the morning of the test. FEMALES- please wear underwire-free bra if available, avoid dresses & tight clothing       After the Test: Drink plenty of water. After receiving IV contrast, you may experience a mild flushed feeling. This is normal. On occasion, you may experience a mild rash up to 24 hours after the test. This is not dangerous.  If this occurs, you can take Benadryl 25 mg and increase your fluid intake. If you experience trouble breathing, this can be serious. If it is severe call 911 IMMEDIATELY. If it is mild, please call our office. If you take any of these medications: Glipizide/Metformin, Avandament, Glucavance, please do not take 48 hours after completing test unless otherwise instructed.  We will call to schedule  your test 2-4 weeks out understanding that some insurance companies will need an authorization prior to the service being performed.   For non-scheduling related questions, please contact the cardiac imaging nurse navigator should you have any questions/concerns: Marchia Bond, Cardiac Imaging Nurse Navigator Gordy Clement, Cardiac Imaging Nurse Navigator [facility] Direct Office Dial: [phone]   For scheduling needs, including cancellations and rescheduling, please call [place], [phone].    Follow-Up: At [facility], you and your health needs are our priority.  As part of our continuing mission to provide you with exceptional heart care, we have created designated Provider Care Teams.  These Care Teams include your primary Cardiologist (physician) and Advanced Practice Providers (APPs -  Physician Assistants and Nurse Practitioners) who all work together to provide you with the care you need, when you need it.  We recommend signing up for the patient portal called "MyChart".  Sign up information is provided on this After Visit Summary.  MyChart is used to connect with patients for Virtual Visits (Telemedicine).  Patients are able to view lab/test results, encounter notes, upcoming appointments, etc.  Non-urgent messages can be sent to your provider as well.   To learn more about what you can do with MyChart, go to [email or web address].    Your next appointment:   2 weeks after cardioversion   Provider:   You may see Dr. Ida Rogue or one of the following Advanced Practice Providers on your designated Care Team:   Murray Hodgkins, NP Christell Faith, PA-C Cadence Kathlen Mody, PA-C Gerrie Nordmann, NP  Other Instructions - None

## 2023-01-21 NOTE — Progress Notes (Signed)
Cardiology Office Note:    Date:  01/21/2023   ID:  [name] [birth year], MRN [id number]  PCP:  Kerri Perches, PA-C  [facility] Cardiologist:  None  [facility] Electrophysiologist:  None   Referring MD: Kerri Perches, PA-C   Chief Complaint: Hospital follow-up  History of Present Illness:    [name] is a 57 y.o. female with a hx of hypertension, diabetes, CHF who is being seen for hospital follow-up.  Patient was admitted to 61 with GI issues, unable to keep food in her stomach and unable to swallow pills.  Blood pressure was elevated at 189/82.  High-sensitivity troponin was 93, 84.  Serum creatinine 1.21.  Abdominal imaging was nonacute.  Echo showed LVEF 35 to 40%.  BNP came back at 553.  She was also found to have new onset A-fib.  Was felt GI issues were due to poor oral intake and dehydration.  EGD showed gastritis.  GI plan for colonoscopy as outpatient.  A-fib was rate controlled, she was started on anticoagulation with plan for outpatient cardioversion  Patient was started on spironolactone, Coreg, Entresto, Jardiance for low EF.  Today, the patient has been doing OK. EKG today shows rate controlled Afib. She had an episode of weakness, but is doing better. She is using a cane at home. She denies chest pain, SOB. She notes lower leg edema, however says it's better than before. No orthopnea or pnd. No palpitations. She is eating no salt. Has not started [place], may need prior auth. Notes were reviewed, and it appear she started Eliquis on 2/8.   Past Medical History:  Diagnosis Date   CHF (congestive heart failure) ([place])    Diabetes mellitus without complication ([place])    Hypertension     Past Surgical History:  Procedure Laterality Date   COLONOSCOPY WITH PROPOFOL N/A 01/08/2023   Procedure: COLONOSCOPY WITH PROPOFOL;  Surgeon: Annamaria Helling, DO;  Location: [facility] ENDOSCOPY;  Service: Gastroenterology;  Laterality: N/A;    ESOPHAGOGASTRODUODENOSCOPY N/A 01/07/2023   Procedure: ESOPHAGOGASTRODUODENOSCOPY (EGD);  Surgeon: Annamaria Helling, DO;  Location: [facility] ENDOSCOPY;  Service: Gastroenterology;  Laterality: N/A;    Current Medications: Current Meds  Medication Sig   apixaban (ELIQUIS) 5 MG TABS tablet Take 1 tablet (5 mg total) by mouth 2 (two) times daily.   atorvastatin (LIPITOR) 20 MG tablet Take 1 tablet (20 mg total) by mouth daily.   carvedilol (COREG) 6.25 MG tablet Take 1 tablet (6.25 mg total) by mouth 2 (two) times daily with a meal.   dapagliflozin propanediol (FARXIGA) 10 MG TABS tablet Take 1 tablet (10 mg total) by mouth daily before breakfast.   metFORMIN (GLUCOPHAGE) 500 MG tablet Take 1 tablet (500 mg total) by mouth 2 (two) times daily with a meal.   metoprolol tartrate (LOPRESSOR) 100 MG tablet Take 1 tablet (100 mg total) by mouth once for 1 dose.   Multiple Vitamin (MULTIVITAMIN WITH MINERALS) TABS tablet Take 1 tablet by mouth daily.   pantoprazole (PROTONIX) 40 MG tablet Take 40 mg by mouth in the morning. Take 30 minutes before first meal for reflux and heartburn.   potassium chloride SA (KLOR-CON M) 20 MEQ tablet Take 1 tablet (20 mEq total) by mouth daily.   spironolactone (ALDACTONE) 25 MG tablet Take 0.5 tablets (12.5 mg total) by mouth daily.   [DISCONTINUED] furosemide (LASIX) 20 MG tablet Take 1 tablet (20 mg total) by mouth daily.     Allergies:   Patient has  no known allergies.   Social History   Socioeconomic History   Marital status: Single    Spouse name: Not on file   Number of children: Not on file   Years of education: Not on file   Highest education level: Not on file  Occupational History   Not on file  Tobacco Use   Smoking status: Never   Smokeless tobacco: Never  Vaping Use   Vaping Use: Never used  Substance and Sexual Activity   Alcohol use: Yes   Drug use: No   Sexual activity: Not Currently  Other Topics Concern   Not on file  Social History  Narrative   Not on file   Social Determinants of Health   Financial Resource Strain: Not on file  Food Insecurity: No Food Insecurity (01/06/2023)   Hunger Vital Sign    Worried About Running Out of Food in the Last Year: Never true    Ran Out of Food in the Last Year: Never true  Transportation Needs: Unmet Transportation Needs (01/06/2023)   PRAPARE - Hydrologist (Medical): Yes    Lack of Transportation (Non-Medical): No  Physical Activity: Not on file  Stress: Not on file  Social Connections: Not on file     Family History: The patient's family history is not on file.  ROS:   Please see the history of present illness.     All other systems reviewed and are negative.  EKGs/Labs/Other Studies Reviewed:    The following studies were reviewed today:  Echo 01/2023  1. Left ventricular ejection fraction, by estimation, is 35 to 40%. The  left ventricle has moderately decreased function. The left ventricle  demonstrates global hypokinesis. The left ventricular internal cavity size  was moderately dilated. There is mild   left ventricular hypertrophy. Left ventricular diastolic parameters are  indeterminate.   2. Right ventricular systolic function is normal. The right ventricular  size is normal. Tricuspid regurgitation signal is inadequate for assessing  PA pressure.   3. Left atrial size was moderately dilated.   4. Right atrial size was mildly dilated.   5. The mitral valve is normal in structure. Mild to moderate mitral valve  regurgitation. No evidence of mitral stenosis.   6. The aortic valve is normal in structure. Aortic valve regurgitation is  mild to moderate. Aortic valve sclerosis/calcification is present, without  any evidence of aortic stenosis.   7. Pulmonic valve regurgitation is moderate.   8. Aortic dilatation noted. There is mild dilatation of the aortic root,  measuring 39 mm.   9. The inferior vena cava is normal in size with  greater than 50%  respiratory variability, suggesting right atrial pressure of 3 mmHg.   Echo 2020  1. Left ventricular ejection fraction, by visual estimation, is 55 to  60%. The left ventricle has normal function. There is mildly increased  left ventricular hypertrophy.   2. Definity contrast agent was given IV to delineate the left ventricular  endocardial borders.   3. Moderately dilated left ventricular internal cavity size.   4. The left ventricle has no regional wall motion abnormalities.   5. Extremely poor acoustic windows Definity used. No gross wall motion  abnormalities.   6. Global right ventricle has normal systolic function.The right  ventricular size is normal. Right vetricular wall thickness was not  assessed.   7. Left atrial size was normal.   8. Right atrial size was normal.   9. Mild mitral  annular calcification.  10. The mitral valve is grossly normal. Trivial mitral valve  regurgitation.  11. The tricuspid valve is grossly normal. Tricuspid valve regurgitation  is not demonstrated.  12. The aortic valve is grossly normal. Aortic valve regurgitation is not  visualized.  13. The pulmonic valve was not well visualized. Pulmonic valve  regurgitation is trivial.  14. The inferior vena cava is normal in size with greater than 50%  respiratory variability, suggesting right atrial pressure of 3 mmHg.  15. The interatrial septum was not assessed.   EKG:  EKG is ordered today.  The ekg ordered today demonstrates Afib 84bpm, Lad, LVH, IVCD, nonspecific T wave changes  Recent Labs: 01/06/2023: TSH 0.689 01/10/2023: ALT 11 01/11/2023: B Natriuretic Peptide 674.6; BUN 16; Creatinine, Ser 0.88; Hemoglobin 13.4; Magnesium 2.0; Platelets 125; Potassium 3.9; Sodium 132  Recent Lipid Panel    Component Value Date/Time   CHOL 160 01/13/2014 0422   TRIG 102 01/13/2014 0422   HDL 39 (L) 01/13/2014 0422   VLDL 20 01/13/2014 0422   LDLCALC 101 (H) 01/13/2014 0422    Physical  Exam:    VS:  BP (!) 150/98 (BP Location: Left Arm, Patient Position: Sitting, Cuff Size: Normal)   Pulse 84   Ht 5' 7"$  (1.702 m)   Wt 221 lb (100.2 kg)   LMP 12/03/2022 (Approximate)   SpO2 98%   BMI 34.61 kg/m     Wt Readings from Last 3 Encounters:  01/21/23 221 lb (100.2 kg)  01/06/23 208 lb 4.8 oz (94.5 kg)  02/01/20 262 lb 9.6 oz (119.1 kg)     GEN:  Well nourished, well developed in no acute distress HEENT: Normal NECK: No JVD; No carotid bruits LYMPHATICS: No lymphadenopathy CARDIAC: Irreg Irreg, no murmurs, rubs, gallops RESPIRATORY:  Clear to auscultation without rales, wheezing or rhonchi  ABDOMEN: Soft, non-tender, non-distended MUSCULOSKELETAL:  2+ lower leg edema; No deformity  SKIN: Warm and dry NEUROLOGIC:  Alert and oriented x 3 PSYCHIATRIC:  Normal affect   ASSESSMENT:    1. Persistent atrial fibrillation ([place])   2. Lower leg edema   3. Chronic diastolic congestive heart failure (HCC)   4. Elevated troponin   5. Essential hypertension    PLAN:    In order of problems listed above:  Persistent A-fib New onset A-fib diagnosed during recent hospitalization started on anticoagulation and rate control medication.  Today she remains in A-fib with controlled rate of 84 bpm. She is taking Eliquis 5 mg twice daily for stroke prophylaxis, she denies missing any doses. She is taking Coreg for rate control. Patient started Eliquis on 01/09/2023 in the hospital.  We will plan for cardioversion on 3/8 or after this.  Will see her back 2 weeks after cardioversion  Lower leg edema Cardiomyopathy EF found to be reduced in the setting of A-fib.  Echo showed newly reduced EF at 35 to 40% with global hypokinesis. Patient was sent home on Coreg, Farxiga, Lasix, Entresto and spironolactone. Patient reports not been able to get a hold of Entresto, therefore I will resend Delene Loll into the pharmacy. Continue Coreg 6.25 mg twice daily, Farxiga 10 mg daily, spironolactone 12.23m  daily.  Patient has lower leg edema on exam.  I will increase Lasix to 20 mg twice daily for 3 days, then back down to 20 mg daily of note, albumin was low in the hospital (2.4), third spacing may be contributing.  Patient is following low-salt diet. Plan to repeat echo in 2  to 3 months. I will check a BMET today  Elevated troponin Troponin elevated to 93 in the hospital. Patient denies anginal symptoms. Echo showed reduced EF as above. I will order a cardiac CTA.  HTN BP mildly elevated, however she is not taking Entresto. Start Entresto 24-26 twice daily as above. Continue spironolactone, Coreg, and [place].  Intractable vomiting/dysphagia Patient reports resolution of symptoms. EGD in the hospital showed gastritis and esophagitis. Colonoscopy without significant finding. GI recommended repeat colonoscopy in 2 months. Continue Protonix.  Disposition: Follow up in 1 month(s) with MD/APP   Shared Decision Making/Informed Consent   Shared Decision Making/Informed Consent The risks (stroke, cardiac arrhythmias rarely resulting in the need for a temporary or permanent pacemaker, skin irritation or burns and complications associated with conscious sedation including aspiration, arrhythmia, respiratory failure and death), benefits (restoration of normal sinus rhythm) and alternatives of a direct current cardioversion were explained in detail to Ms. Glasby and she agrees to proceed.      Signed, Taeko Schaffer Arlyss Repress  01/21/2023 4:51 PM    [facility]

## 2023-01-22 NOTE — Telephone Encounter (Signed)
Received fax from [place]  PA denial for [facility].   "Your doctor must tell us you do not have a history of angioedema (swelling under the skin) related to therapy with an ACE or ARB."  Faxed Denial to Lenon Oms, CPhT - who it looks like was working on this.

## 2023-01-23 NOTE — Anesthesia Postprocedure Evaluation (Signed)
Anesthesia Post Note  Patient: [name]  Procedure(s) Performed: ESOPHAGOGASTRODUODENOSCOPY (EGD)  Patient location during evaluation: Endoscopy Anesthesia Type: General Level of consciousness: awake and alert Pain management: pain level controlled Vital Signs Assessment: post-procedure vital signs reviewed and stable Respiratory status: spontaneous breathing, nonlabored ventilation, respiratory function stable and patient connected to nasal cannula oxygen Cardiovascular status: blood pressure returned to baseline and stable Postop Assessment: no apparent nausea or vomiting Anesthetic complications: no   No notable events documented.   Last Vitals:  Vitals:   01/11/23 0531 01/11/23 0805  BP: (!) 110/94 113/81  Pulse: 72 60  Resp: 18   Temp:    SpO2: 100% 99%    Last Pain:  Vitals:   01/11/23 0900  TempSrc:   PainSc: 0-No pain                 Martha Clan

## 2023-02-18 ENCOUNTER — Encounter: Payer: Self-pay | Admitting: Anesthesiology

## 2023-02-18 ENCOUNTER — Ambulatory Visit: Admission: RE | Admit: 2023-02-18 | Payer: Medicaid Other | Source: Home / Self Care | Admitting: Internal Medicine

## 2023-02-18 SURGERY — CARDIOVERSION
Anesthesia: General

## 2023-03-04 ENCOUNTER — Ambulatory Visit: Payer: Medicaid Other | Attending: Medical | Admitting: Medical

## 2023-03-04 ENCOUNTER — Encounter: Payer: Self-pay | Admitting: Medical

## 2023-03-04 NOTE — Progress Notes (Deleted)
Cardiology Office Note:    Date:  03/04/2023   ID:  Wendy, Griffin 1966/01/10, MRN [id number]  PCP:  Kerri Perches, PA-C  [facility] Cardiologist:  None  [facility] Electrophysiologist:  None   Referring MD: Kerri Perches, PA-C   Chief Complaint: ***  History of Present Illness:    [name] is a 57 y.o. female with a hx of hypertension, diabetes, CHF who is being seen for hospital follow-up.   Patient was admitted to 50 with GI issues, unable to keep food in her stomach and unable to swallow pills.  Blood pressure was elevated at 189/82.  High-sensitivity troponin was 93, 84.  Serum creatinine 1.21.  Abdominal imaging was nonacute.  Echo showed LVEF 35 to 40%.  BNP came back at 553.  She was also found to have new onset A-fib.  Was felt GI issues were due to poor oral intake and dehydration.  EGD showed gastritis.  GI plan for colonoscopy as outpatient.  A-fib was rate controlled, she was started on anticoagulation with plan for outpatient cardioversion  Patient was started on spironolactone, Coreg, Entresto, Jardiance for low EF.  Patient was last seen 01/21/2023 and was overall doing okay.  EKG showed rate controlled A-fib.  Patient started Eliquis on 2/8.  Patient was set up for cardioversion 4 weeks after this.  Patient had lower leg edema on exam and Lasix was doubled for 3 days.  Cardiac CTA was ordered for elevated troponin.  Today, EKG for A-fib Repeat echo Cardiac CTA  Past Medical History:  Diagnosis Date   CHF (congestive heart failure) ([place])    Diabetes mellitus without complication ([place])    Hypertension     Past Surgical History:  Procedure Laterality Date   COLONOSCOPY WITH PROPOFOL N/A 01/08/2023   Procedure: COLONOSCOPY WITH PROPOFOL;  Surgeon: Annamaria Helling, DO;  Location: [facility] ENDOSCOPY;  Service: Gastroenterology;  Laterality: N/A;   ESOPHAGOGASTRODUODENOSCOPY N/A 01/07/2023   Procedure: ESOPHAGOGASTRODUODENOSCOPY (EGD);   Surgeon: Annamaria Helling, DO;  [facility] ENDOSCOPY;  Service: Gastroenterology;  Laterality: N/A;    Current Medications: No outpatient medications have been marked as taking for the 03/04/23 encounter (Appointment) with Kathlen Mody, [name] H, PA-C.     Allergies:   Patient has no known allergies.   Social History   Socioeconomic History   Marital status: Single    Spouse name: Not on file   Number of children: Not on file   Years of education: Not on file   Highest education level: Not on file  Occupational History   Not on file  Tobacco Use   Smoking status: Never   Smokeless tobacco: Never  Vaping Use   Vaping Use: Never used  Substance and Sexual Activity   Alcohol use: Yes   Drug use: No   Sexual activity: Not Currently  Other Topics Concern   Not on file  Social History Narrative   Not on file   Social Determinants of Health   Financial Resource Strain: Not on file  Food Insecurity: No Food Insecurity (01/06/2023)   Hunger Vital Sign    Worried About Running Out of Food in the Last Year: Never true    Ran Out of Food in the Last Year: Never true  Transportation Needs: Unmet Transportation Needs (01/06/2023)   PRAPARE - Hydrologist (Medical): Yes    Lack of Transportation (Non-Medical): No  Physical Activity: Not on file  Stress: Not on file  Social Connections: Not on file     Family History: The patient's family history is not on file.  ROS:   Please see the history of present illness.     All other systems reviewed and are negative.  EKGs/Labs/Other Studies Reviewed:    The following studies were reviewed today: ***  EKG:  EKG is *** ordered today.  The ekg ordered today demonstrates ***  Recent Labs: 01/06/2023: TSH 0.689 01/10/2023: ALT 11 01/11/2023: B Natriuretic Peptide 674.6; BUN 16; Creatinine, Ser 0.88; Hemoglobin 13.4; Magnesium 2.0; Platelets 125; Potassium 3.9; Sodium 132  Recent Lipid Panel    Component  Value Date/Time   CHOL 160 01/13/2014 0422   TRIG 102 01/13/2014 0422   HDL 39 (L) 01/13/2014 0422   VLDL 20 01/13/2014 0422   LDLCALC 101 (H) 01/13/2014 0422     Risk Assessment/Calculations:   {Does this patient have ATRIAL FIBRILLATION?:[phone]}   Physical Exam:    VS:  There were no vitals taken for this visit.    Wt Readings from Last 3 Encounters:  01/21/23 221 lb (100.2 kg)  01/06/23 208 lb 4.8 oz (94.5 kg)  02/01/20 262 lb 9.6 oz (119.1 kg)     GEN: *** Well nourished, well developed in no acute distress HEENT: Normal NECK: No JVD; No carotid bruits LYMPHATICS: No lymphadenopathy CARDIAC: ***RRR, no murmurs, rubs, gallops RESPIRATORY:  Clear to auscultation without rales, wheezing or rhonchi  ABDOMEN: Soft, non-tender, non-distended MUSCULOSKELETAL:  No edema; No deformity  SKIN: Warm and dry NEUROLOGIC:  Alert and oriented x 3 PSYCHIATRIC:  Normal affect   ASSESSMENT:    No diagnosis found. PLAN:    In order of problems listed above:  Persistent A-fib  Lower leg edema Cardiomyopathy  Elevated troponin  Hypertension  GI issues  Disposition: Follow up {follow up:15908} with ***   Shared Decision Making/Informed Consent   {Are you ordering a CV Procedure (e.g. stress test, cath, DCCV, TEE, etc)?   Press F2        :[id number]    Signed, Ger Ringenberg Ninfa Meeker, PA-C  03/04/2023 8:22 AM    [facility]

## 2023-04-10 ENCOUNTER — Encounter (HOSPITAL_COMMUNITY): Payer: Self-pay

## 2024-01-31 DEATH — deceased
